# Patient Record
Sex: Female | Born: 1960 | Race: White | Hispanic: No | Marital: Married | Smoking: Former smoker
Health system: Southern US, Community
[De-identification: ages and names within clinical notes are randomized; demographics above are authoritative.]

## PROBLEM LIST (undated history)

## (undated) DIAGNOSIS — I2510 Atherosclerotic heart disease of native coronary artery without angina pectoris: Secondary | ICD-10-CM

## (undated) DIAGNOSIS — Z9889 Other specified postprocedural states: Secondary | ICD-10-CM

## (undated) DIAGNOSIS — Q685 Congenital bowing of long bones of leg, unspecified: Secondary | ICD-10-CM

## (undated) DIAGNOSIS — R112 Nausea with vomiting, unspecified: Secondary | ICD-10-CM

## (undated) DIAGNOSIS — K5732 Diverticulitis of large intestine without perforation or abscess without bleeding: Secondary | ICD-10-CM

## (undated) DIAGNOSIS — H8110 Benign paroxysmal vertigo, unspecified ear: Secondary | ICD-10-CM

## (undated) DIAGNOSIS — K219 Gastro-esophageal reflux disease without esophagitis: Secondary | ICD-10-CM

## (undated) DIAGNOSIS — Z8719 Personal history of other diseases of the digestive system: Secondary | ICD-10-CM

## (undated) DIAGNOSIS — F338 Other recurrent depressive disorders: Secondary | ICD-10-CM

## (undated) DIAGNOSIS — Z8619 Personal history of other infectious and parasitic diseases: Secondary | ICD-10-CM

## (undated) DIAGNOSIS — M1990 Unspecified osteoarthritis, unspecified site: Secondary | ICD-10-CM

## (undated) DIAGNOSIS — Z8759 Personal history of other complications of pregnancy, childbirth and the puerperium: Secondary | ICD-10-CM

## (undated) HISTORY — DX: Atherosclerotic heart disease of native coronary artery without angina pectoris: I25.10

## (undated) HISTORY — PX: OTHER SURGICAL HISTORY: SHX169

## (undated) HISTORY — DX: Other recurrent depressive disorders: F33.8

## (undated) HISTORY — DX: Gastro-esophageal reflux disease without esophagitis: K21.9

## (undated) HISTORY — PX: COLON SURGERY: SHX602

## (undated) HISTORY — PX: OSTEOTOMY: SHX137

## (undated) HISTORY — PX: TUBAL LIGATION: SHX77

## (undated) HISTORY — PX: TONSILLECTOMY: SUR1361

---

## 1982-05-01 HISTORY — PX: CHOLECYSTECTOMY OPEN: SUR202

## 1999-05-02 HISTORY — PX: LAPAROSCOPY FOR ECTOPIC PREGNANCY: SUR765

## 2000-01-29 DIAGNOSIS — O009 Unspecified ectopic pregnancy without intrauterine pregnancy: Secondary | ICD-10-CM | POA: Insufficient documentation

## 2000-01-29 DIAGNOSIS — Z9851 Tubal ligation status: Secondary | ICD-10-CM | POA: Insufficient documentation

## 2000-01-29 HISTORY — DX: Unspecified ectopic pregnancy without intrauterine pregnancy: O00.90

## 2002-10-14 ENCOUNTER — Other Ambulatory Visit: Admission: RE | Admit: 2002-10-14 | Discharge: 2002-10-14 | Payer: Self-pay | Admitting: Otolaryngology

## 2009-06-30 ENCOUNTER — Ambulatory Visit: Payer: Self-pay | Admitting: Family Medicine

## 2010-02-18 ENCOUNTER — Other Ambulatory Visit: Payer: Self-pay | Admitting: Unknown Physician Specialty

## 2010-12-21 ENCOUNTER — Encounter: Payer: Self-pay | Admitting: Internal Medicine

## 2010-12-21 ENCOUNTER — Ambulatory Visit (INDEPENDENT_AMBULATORY_CARE_PROVIDER_SITE_OTHER): Payer: PRIVATE HEALTH INSURANCE | Admitting: Internal Medicine

## 2010-12-21 VITALS — BP 100/58 | HR 68 | Temp 98.1°F | Resp 14 | Ht 65.0 in | Wt 165.2 lb

## 2010-12-21 DIAGNOSIS — Z720 Tobacco use: Secondary | ICD-10-CM

## 2010-12-21 DIAGNOSIS — F17200 Nicotine dependence, unspecified, uncomplicated: Secondary | ICD-10-CM

## 2010-12-21 DIAGNOSIS — Z87891 Personal history of nicotine dependence: Secondary | ICD-10-CM | POA: Insufficient documentation

## 2010-12-21 MED ORDER — NAPROXEN 500 MG PO TABS: 500.0000 mg | ORAL_TABLET | ORAL | Status: DC | PRN

## 2010-12-21 MED ORDER — OMEPRAZOLE 20 MG PO CPDR: 20.0000 mg | DELAYED_RELEASE_CAPSULE | Freq: Two times a day (BID) | ORAL | Status: DC

## 2010-12-21 MED ORDER — BUPROPION HCL 75 MG PO TABS: 75.0000 mg | ORAL_TABLET | Freq: Two times a day (BID) | ORAL | Status: DC

## 2010-12-21 NOTE — Progress Notes (Signed)
  Subjective:    Patient ID: Pamela Lara, female    DOB: 08/05/1960, 50 y.o.   MRN: [id number]  HPI Ms. Bottenfield is here to establish primary care .  Her previous physician was [facility]. She has no specific complaints but is planning to quit smoking and is requesting a prescriptin for wellbutrin.  Her husband gained 30 lbs on Chantix so she is not interested in trying it.     Review of Systems  Constitutional: Negative for fever, chills and unexpected weight change.  HENT: Negative for hearing loss, ear pain, nosebleeds, congestion, sore throat, facial swelling, rhinorrhea, sneezing, mouth sores, trouble swallowing, neck pain, neck stiffness, voice change, postnasal drip, sinus pressure, tinnitus and ear discharge.   Eyes: Negative for pain, discharge, redness and visual disturbance.  Respiratory: Negative for cough, chest tightness, shortness of breath, wheezing and stridor.   Cardiovascular: Negative for chest pain, palpitations and leg swelling.  Musculoskeletal: Negative for myalgias and arthralgias.  Skin: Negative for color change and rash.  Neurological: Negative for dizziness, weakness, light-headedness and headaches.  Hematological: Negative for adenopathy.  Psychiatric/Behavioral: Positive for dysphoric mood.       Objective:   Physical Exam        Assessment & Plan:

## 2011-02-13 ENCOUNTER — Telehealth: Payer: Self-pay | Admitting: *Deleted

## 2011-02-13 NOTE — Telephone Encounter (Signed)
Left message asking patient to return my call.

## 2011-02-13 NOTE — Telephone Encounter (Signed)
Message copied by Jobie Quaker on Mon Feb 13, 2011  2:02 PM ------      Message from: Duncan Dull      Created: Fri Feb 10, 2011  1:11 PM      Regarding: labs       Her outside labs were reviewed,  All i s fine except her triglycerides were 2 x normal.  I would like to address this with diet and exercise and repeat in 6 months.  272.5 fasting lipids.             Low carbohydrate /low glycemic index diet is the best dietary answer to elevated triglycerides.

## 2011-02-21 NOTE — Telephone Encounter (Signed)
Left message asking patient to return my call.

## 2011-02-27 NOTE — Telephone Encounter (Signed)
Left message asking patient to return my call.

## 2011-03-02 ENCOUNTER — Other Ambulatory Visit: Payer: Self-pay | Admitting: Internal Medicine

## 2011-03-02 DIAGNOSIS — E786 Lipoprotein deficiency: Secondary | ICD-10-CM

## 2011-03-02 NOTE — Telephone Encounter (Signed)
Spoke with patient. She was advised. Also mailed her an order for labs for 6 months because she has theses done at the hospital.

## 2011-03-14 ENCOUNTER — Ambulatory Visit: Payer: Self-pay | Admitting: Internal Medicine

## 2011-04-05 ENCOUNTER — Encounter: Payer: Self-pay | Admitting: Internal Medicine

## 2011-04-12 ENCOUNTER — Other Ambulatory Visit: Payer: Self-pay | Admitting: Internal Medicine

## 2011-04-13 MED ORDER — BUPROPION HCL 75 MG PO TABS: 75.0000 mg | ORAL_TABLET | Freq: Two times a day (BID) | ORAL | Status: DC

## 2011-04-13 NOTE — Telephone Encounter (Signed)
Patient is asking if she can get a 90 day supply on the wellbutrin.

## 2011-06-16 ENCOUNTER — Telehealth: Payer: Self-pay | Admitting: Internal Medicine

## 2011-06-16 NOTE — Telephone Encounter (Signed)
[facility] Triage Call Report Triage Record Num: [id number] Operator: Caswell Corwin Patient Name: Pamela Lara Call Date & Time: 06/16/2011 9:45:15AM Patient Phone: [phone] PCP: Patient Gender: Female PCP Fax : Patient DOB: 1960/11/12 Practice Name: [facility] Reason for Call: Caller: [name]; PCP: Duncan Dull; CB#: [phone]; Call regarding Menstrual Questions and has been having a period for last two weeks. This is day 15. A she is spotting and then may have some heavy flow for 1-2 hrs. Triaged Vag Bldg/Premenopausal/Abnormal and all emergent SX R/O. Home care and call back inst given. Inst needs to be seen in 2 weeks. Appt made for 03/22/12 at 1100 with [facility] per pt's request. Home care and call back inst given. Protocol(s) Used: Vaginal Bleeding: Premenopausal, Abnormal Recommended Outcome per Protocol: See Provider within 2 Weeks Reason for Outcome: Mild bleeding lasting more than 3 times the woman's usual period length Care Advice: ~ Use a "menstrual calendar" to track bleeding and menstrual cycle. ~ Call provider if symptoms worsen or new symptoms develop. Avoid the use of douches, other nonprescription vaginal preparations, or tub baths for 24 hours prior to provider evaluation. These activities may interfere with Pap smear, cultures, or other testing the provider may want to do. ~ 06/16/2011 10:04:28AM Page 1 of 1 [email or web address]

## 2011-06-21 NOTE — Telephone Encounter (Signed)
[facility] Triage Call Report Triage Record Num: [id number] Operator: Geanie Berlin Patient Name: Pamela Lara Call Date & Time: 06/21/2011 10:19:36AM Patient Phone: [phone] PCP: Duncan Dull Patient Gender: Female PCP Fax : [phone] Patient DOB: 1961/04/29 Practice Name: [facility] Reason for Call: Caller: Madilyn/Patient; PCP: Duncan Dull; CB#: [phone] (work) ; Call regarding Has Appointment 06/23/11 at 1100; Still Has Heavy Vaginal Bleeding; Asking if should Have Any Labs Prior To This Appointment.; LMP 06/02/11. BTL. Uses up to 2 pads/hr sometimes at night then flow tapers to approx 3-4 peripads per day. Declined triage. Info noted and sent to MD for reply for caller with non urgent question per PCP Call, No TriAge Guideline. OFFICE PLEASE CALL BACK; OK to leave message at work number above. Protocol(s) Used: PCP Calls, No Triage (Adult) Recommended Outcome per Protocol: Call Provider within 24 Hours Override Outcome if Used in Protocol: Information Noted and Sent to Office RN Reason for Override Outcome: Office To Call Pt. W/ F/u. Reason for Outcome: [1] Caller requests to speak ONLY to PCP AND [2] nonurgent question Care Advice: ~ 06/21/2011 10:28:12AM Page 1 of 1 [email or web address]

## 2011-06-21 NOTE — Telephone Encounter (Signed)
Left messages at home, cell and work numbers for pt to return the call.

## 2011-06-21 NOTE — Telephone Encounter (Signed)
She needs a cbc,  Ferriti, tibc,  PT/INR,  And TSH

## 2011-06-22 ENCOUNTER — Other Ambulatory Visit: Payer: Self-pay | Admitting: Internal Medicine

## 2011-06-22 ENCOUNTER — Telehealth: Payer: Self-pay | Admitting: Internal Medicine

## 2011-06-22 DIAGNOSIS — R58 Hemorrhage, not elsewhere classified: Secondary | ICD-10-CM

## 2011-06-22 LAB — CBC WITH DIFFERENTIAL/PLATELET
Basophil %: 0.2 %
Eosinophil #: 0.1 10*3/uL (ref 0.0–0.7)
Eosinophil %: 1.4 %
HGB: 13.4 g/dL (ref 12.0–16.0)
Lymphocyte %: 31.4 %
MCHC: 34.8 g/dL (ref 32.0–36.0)
Monocyte #: 0.6 10*3/uL (ref 0.0–0.7)
Neutrophil %: 59.3 %
Platelet: 221 10*3/uL (ref 150–440)
RBC: 4.25 10*6/uL (ref 3.80–5.20)
WBC: 7.7 10*3/uL (ref 3.6–11.0)

## 2011-06-22 LAB — PROTIME-INR
INR: 0.9
Prothrombin Time: 12.4 secs (ref 11.5–14.7)

## 2011-06-22 LAB — IRON AND TIBC
Iron Bind.Cap.(Total): 413 ug/dL (ref 250–450)
Iron Saturation: 13 %
Iron: 54 ug/dL (ref 50–170)

## 2011-06-22 LAB — FERRITIN: Ferritin (ARMC): 11 ng/mL (ref 8–388)

## 2011-06-22 LAB — TSH: Thyroid Stimulating Horm: 1.4 u[IU]/mL

## 2011-06-22 NOTE — Telephone Encounter (Signed)
Office Message [address] [place], [place] [address] p. [phone] f. [phone] To: [facility] (Daytime Triage) Fax: [phone] From: [facility] Date/ Time: 06/22/2011 4:05 PM Taken By: Gerri Spore, CSR Caller: [name] Facility: not collected Patient: Pamela Lara DOB: 16-May-1960 Phone: [phone] Reason for Call: Pt states she left a message early this morning but has not received a call back yet. 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 Marland Kitchen . . Called back and said that she has called 10x today and not got a call back on her work # [phone]. Please call when you can. Regarding Appointment: Appt Date: Appt Time: Unknown Provider: Reason: Details: Outcome: confidential

## 2011-06-22 NOTE — Telephone Encounter (Signed)
Office Message [address] [place], [place] [address] p. [phone] f. [phone] To: [facility] (Daytime Triage) Fax: [phone] From: [facility] Date/ Time: 06/22/2011 2:37 PM Taken By: Patria Mane, CSR Caller: [name] Facility: not collected Patient: Pamela, Lara DOB: 20-Jan-1961 Phone: [phone] Reason for Call: Pt states she left a message early this morning but has not received a call back yet. 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 Regarding Appointment: Appt Date: Appt Time: Unknown Provider: Reason: Details: Outcome:

## 2011-06-22 NOTE — Telephone Encounter (Signed)
Office Message [address] [place], [place] [address] p. [phone] f. [phone] To: [facility] (Daytime Triage) Fax: [phone] From: [facility] Date/ Time: 06/22/2011 9:23 AM Taken By: Thomasena Edis, CSR Caller: Revonda Standard Facility: not collected Patient: Pamela, Lara DOB: April 09, 1961 Phone: [phone] Reason for Call: She is returning Laura's call Regarding Appointment: Appt Date: Appt Time: Unknown Provider: Reason: Details: Outcome:

## 2011-06-22 NOTE — Telephone Encounter (Signed)
Opened in error

## 2011-06-23 ENCOUNTER — Encounter: Payer: Self-pay | Admitting: Internal Medicine

## 2011-06-23 ENCOUNTER — Ambulatory Visit (INDEPENDENT_AMBULATORY_CARE_PROVIDER_SITE_OTHER): Payer: PRIVATE HEALTH INSURANCE | Admitting: Internal Medicine

## 2011-06-23 VITALS — BP 112/68 | HR 68 | Temp 98.6°F | Wt 172.0 lb

## 2011-06-23 DIAGNOSIS — N924 Excessive bleeding in the premenopausal period: Secondary | ICD-10-CM

## 2011-06-23 DIAGNOSIS — F411 Generalized anxiety disorder: Secondary | ICD-10-CM

## 2011-06-23 DIAGNOSIS — F419 Anxiety disorder, unspecified: Secondary | ICD-10-CM

## 2011-06-23 MED ORDER — BUPROPION HCL 75 MG PO TABS: 75.0000 mg | ORAL_TABLET | Freq: Two times a day (BID) | ORAL | Status: DC

## 2011-06-23 NOTE — Progress Notes (Signed)
Subjective:    Patient ID: Pamela Lara, female    DOB: November 21, 1960, 51 y.o.   MRN: [id number]  HPI    51 yr old white female presents with persistent vaginal bleeding for 3 weeks.  Her bleeding is Heavy at times  with bright red clots and not associated with a lot of cramping.   She has no prior history of menstrual irregularities,  No symptoms of menopause,  And no history of bleeding diathesis.  Husband has had a vasectomy so she no chance of IUP.  Past Medical History  Diagnosis Date  . GERD (gastroesophageal reflux disease)   . Seasonal affective disorder    Current Outpatient Prescriptions on File Prior to Visit  Medication Sig Dispense Refill  . Multiple Vitamin (MULTIVITAMIN) tablet Take 1 tablet by mouth daily.        . naproxen (NAPROSYN) 500 MG tablet Take 1 tablet (500 mg total) by mouth as needed.  90 tablet  3  . omeprazole (PRILOSEC) 20 MG capsule Take 1 capsule (20 mg total) by mouth 2 (two) times daily.  180 capsule  3     Review of Systems  Constitutional: Negative for fever, chills and unexpected weight change.  HENT: Negative for hearing loss, ear pain, nosebleeds, congestion, sore throat, facial swelling, rhinorrhea, sneezing, mouth sores, trouble swallowing, neck pain, neck stiffness, voice change, postnasal drip, sinus pressure, tinnitus and ear discharge.   Eyes: Negative for pain, discharge, redness and visual disturbance.  Respiratory: Negative for cough, chest tightness, shortness of breath, wheezing and stridor.   Cardiovascular: Negative for chest pain, palpitations and leg swelling.  Musculoskeletal: Negative for myalgias and arthralgias.  Skin: Negative for color change and rash.  Neurological: Negative for dizziness, weakness, light-headedness and headaches.  Hematological: Negative for adenopathy.       Objective:   Physical Exam  Constitutional: She is oriented to person, place, and time. She appears well-developed and well-nourished.  HENT:    Mouth/Throat: Oropharynx is clear and moist.  Eyes: EOM are normal. Pupils are equal, round, and reactive to light. No scleral icterus.  Neck: Normal range of motion. Neck supple. No JVD present. No thyromegaly present.  Cardiovascular: Normal rate, regular rhythm, normal heart sounds and intact distal pulses.   Pulmonary/Chest: Effort normal and breath sounds normal.  Abdominal: Soft. Bowel sounds are normal. She exhibits no mass. There is no tenderness.  Musculoskeletal: Normal range of motion. She exhibits no edema.  Lymphadenopathy:    She has no cervical adenopathy.  Neurological: She is alert and oriented to person, place, and time.  Skin: Skin is warm and dry.  Psychiatric: She has a normal mood and affect.      Assessment & Plan:   Menorrhagia, premenopausal She has no signs of clotting disorder, anemia, thyroid disorder, or liver/kidney dysfunction.  Likely secondary to premenopausal hormonal instability .  Offered rx for oral contraceptives to regulate periods, vs referral to Gynecology for endometrial ultrasoudn/evaluation.  Will reefer to GYN,  Dr. Greggory Keen.     Updated Medication List Outpatient Encounter Prescriptions as of 06/23/2011  Medication Sig Dispense Refill  . buPROPion (WELLBUTRIN) 75 MG tablet Take 1 tablet (75 mg total) by mouth 2 (two) times daily.  180 tablet  3  . calcium carbonate (OS-CAL) 600 MG TABS Take 600 mg by mouth daily.      . Cholecalciferol (VITAMIN D3) 1000 UNITS CAPS Take one by mouth daily      . Multiple Vitamin (MULTIVITAMIN) tablet Take 1  tablet by mouth daily.        . naproxen (NAPROSYN) 500 MG tablet Take 1 tablet (500 mg total) by mouth as needed.  90 tablet  3  . omeprazole (PRILOSEC) 20 MG capsule Take 1 capsule (20 mg total) by mouth 2 (two) times daily.  180 capsule  3  . DISCONTD: buPROPion (WELLBUTRIN) 75 MG tablet Take 1 tablet (75 mg total) by mouth 2 (two) times daily.  180 tablet  0

## 2011-06-23 NOTE — Telephone Encounter (Signed)
Spoke with patient yesterday afternoon and faxed over orders to [facility]. Patient had labs done yesterday evening and we have already received results.

## 2011-06-23 NOTE — Patient Instructions (Signed)
I am sending you to Dr Beatris Si for evaluation of your perimenopausal bleeding

## 2011-06-25 ENCOUNTER — Encounter: Payer: Self-pay | Admitting: Internal Medicine

## 2011-06-25 DIAGNOSIS — N924 Excessive bleeding in the premenopausal period: Secondary | ICD-10-CM | POA: Insufficient documentation

## 2011-06-25 NOTE — Assessment & Plan Note (Signed)
She has no signs of clotting disorder, anemia, thyroid disorder, or liver/kidney dysfunction.  Likely secondary to premenopausal hormonal instability .  Offered rx for oral contraceptives to regulate periods, vs referral to Gynecology for endometrial ultrasoudn/evaluation.  Will reefer to GYN,  Dr. Greggory Keen.

## 2011-06-30 ENCOUNTER — Telehealth: Payer: Self-pay | Admitting: Internal Medicine

## 2011-07-10 ENCOUNTER — Encounter: Payer: Self-pay | Admitting: Internal Medicine

## 2011-08-15 NOTE — Telephone Encounter (Signed)
OPened in error.

## 2011-12-19 ENCOUNTER — Ambulatory Visit: Payer: Self-pay | Admitting: Internal Medicine

## 2011-12-19 LAB — URINALYSIS, COMPLETE

## 2011-12-21 LAB — URINE CULTURE

## 2012-02-22 ENCOUNTER — Telehealth: Payer: Self-pay | Admitting: Internal Medicine

## 2012-02-22 DIAGNOSIS — Z1239 Encounter for other screening for malignant neoplasm of breast: Secondary | ICD-10-CM

## 2012-02-22 DIAGNOSIS — Z1211 Encounter for screening for malignant neoplasm of colon: Secondary | ICD-10-CM

## 2012-02-22 NOTE — Telephone Encounter (Signed)
Patient needing a referral for colonoscopy screening with Dr. Niel Hummer and a screening mammogram at [facility].

## 2012-02-22 NOTE — Telephone Encounter (Signed)
Orders in EPIC.   If you do not receive a call about your appt int the next 48 hours, please call our office and ask for Erie Noe, our scheduler.

## 2012-03-14 ENCOUNTER — Ambulatory Visit: Payer: Self-pay | Admitting: Internal Medicine

## 2012-03-14 LAB — HM MAMMOGRAPHY: HM Mammogram: NORMAL

## 2012-03-27 ENCOUNTER — Ambulatory Visit (INDEPENDENT_AMBULATORY_CARE_PROVIDER_SITE_OTHER): Payer: PRIVATE HEALTH INSURANCE | Admitting: Internal Medicine

## 2012-03-27 ENCOUNTER — Encounter: Payer: Self-pay | Admitting: Internal Medicine

## 2012-03-27 VITALS — BP 120/70 | HR 78 | Temp 98.5°F | Ht 64.0 in | Wt 169.0 lb

## 2012-03-27 DIAGNOSIS — Z720 Tobacco use: Secondary | ICD-10-CM

## 2012-03-27 DIAGNOSIS — F411 Generalized anxiety disorder: Secondary | ICD-10-CM

## 2012-03-27 DIAGNOSIS — Z0000 Encounter for general adult medical examination without abnormal findings: Secondary | ICD-10-CM

## 2012-03-27 DIAGNOSIS — F17200 Nicotine dependence, unspecified, uncomplicated: Secondary | ICD-10-CM

## 2012-03-27 DIAGNOSIS — F419 Anxiety disorder, unspecified: Secondary | ICD-10-CM

## 2012-03-27 MED ORDER — BUPROPION HCL 75 MG PO TABS: 75.0000 mg | ORAL_TABLET | Freq: Two times a day (BID) | ORAL | Status: DC

## 2012-03-27 MED ORDER — OMEPRAZOLE 20 MG PO CPDR: 20.0000 mg | DELAYED_RELEASE_CAPSULE | Freq: Two times a day (BID) | ORAL | Status: DC

## 2012-03-27 NOTE — Progress Notes (Signed)
Patient ID: Pamela Lara, female   DOB: 06/23/1960, 51 y.o.   MRN: [id number]   Subjective:     Pamela Lara is a 51 y.o. female and is here for a comprehensive physical exam. The patient reports no problems.  History   Social History  . Marital Status: Married    Spouse Name: N/A    Number of Children: N/A  . Years of Education: N/A   Occupational History  . Not on file.   Social History Main Topics  . Smoking status: Current Every Day Smoker    Types: Cigarettes  . Smokeless tobacco: Never Used  . Alcohol Use: Yes     Comment: occasional  . Drug Use: No  . Sexually Active: Not on file   Other Topics Concern  . Not on file   Social History Narrative  . No narrative on file   Health Maintenance  Topic Date Due  . Pap Smear  02/24/1979  . Colonoscopy  02/24/2011  . Influenza Vaccine  12/30/2012  . Mammogram  02/24/2014  . Tetanus/tdap  03/27/2020    The following portions of the patient's history were reviewed and updated as appropriate: allergies, current medications, past family history, past medical history, past social history, past surgical history and problem list.  Review of Systems A comprehensive review of systems was negative.   Objective:   General appearance: alert, cooperative and appears stated age Ears: normal TM's and external ear canals both ears Throat: lips, mucosa, and tongue normal; teeth and gums normal Neck: no adenopathy, no carotid bruit, supple, symmetrical, trachea midline and thyroid not enlarged, symmetric, no tenderness/mass/nodules Back: symmetric, no curvature. ROM normal. No CVA tenderness. Lungs: clear to auscultation bilaterally Breasts: breasts appear normal, no suspicious masses, no skin or nipple changes or axillary nodes. Heart: regular rate and rhythm, S1, S2 normal, no murmur, click, rub or gallop Abdomen: soft, non-tender; bowel sounds normal; no masses,  no organomegaly Pulses: 2+ and symmetric Skin: Skin color,  texture, turgor normal. No rashes or lesions Lymph nodes: Cervical, supraclavicular, and axillary nodes normal.      Assessment:   Routine general medical examination at a health care facility And annual exam was completed except for pelvic exam due to current menstruation.  She were to return in a few months for a pelvic exam only. She was given prescription for screening labs to be done at [facility] including screening for thyroid disorder diabetes and hyperlipidemia.  Tobacco abuse She was able to quit for 2 weeks while recently vacationing in [place]. Encouragement given.   Updated Medication List Outpatient Encounter Prescriptions as of 03/27/2012  Medication Sig Dispense Refill  . buPROPion (WELLBUTRIN) 75 MG tablet Take 1 tablet (75 mg total) by mouth 2 (two) times daily.  180 tablet  6  . calcium carbonate (OS-CAL) 600 MG TABS Take 600 mg by mouth daily.      . Cholecalciferol (VITAMIN D3) 1000 UNITS CAPS Take one by mouth daily      . Misc Natural Products (GLUCOSAMINE CHOND COMPLEX/MSM PO) Take by mouth daily. take 2 pills once a day      . Multiple Vitamin (MULTIVITAMIN) tablet Take 1 tablet by mouth daily.        . naproxen (NAPROSYN) 500 MG tablet Take 1 tablet (500 mg total) by mouth as needed.  90 tablet  3  . omeprazole (PRILOSEC) 20 MG capsule Take 1 capsule (20 mg total) by mouth 2 (two) times daily.  180 capsule  3  . [DISCONTINUED] buPROPion (WELLBUTRIN) 75 MG tablet Take 1 tablet (75 mg total) by mouth 2 (two) times daily.  180 tablet  3  . [DISCONTINUED] omeprazole (PRILOSEC) 20 MG capsule Take 1 capsule (20 mg total) by mouth 2 (two) times daily.  180 capsule  3

## 2012-03-29 ENCOUNTER — Encounter: Payer: Self-pay | Admitting: Internal Medicine

## 2012-03-29 DIAGNOSIS — Z0000 Encounter for general adult medical examination without abnormal findings: Secondary | ICD-10-CM | POA: Insufficient documentation

## 2012-03-29 NOTE — Assessment & Plan Note (Signed)
And annual exam was completed except for pelvic exam due to current menstruation.  She were to return in a few months for a pelvic exam only. She was given prescription for screening labs to be done at [facility] including screening for thyroid disorder diabetes and hyperlipidemia.

## 2012-03-29 NOTE — Assessment & Plan Note (Signed)
She was able to quit for 2 weeks while recently vacationing in [place]. Encouragement given.

## 2012-04-10 ENCOUNTER — Ambulatory Visit: Payer: Self-pay | Admitting: Gastroenterology

## 2012-04-10 ENCOUNTER — Other Ambulatory Visit: Payer: Self-pay | Admitting: Internal Medicine

## 2012-04-10 LAB — CBC WITH DIFFERENTIAL/PLATELET
Basophil #: 0 10*3/uL (ref 0.0–0.1)
Basophil %: 0.3 %
Eosinophil #: 0.1 10*3/uL (ref 0.0–0.7)
Eosinophil %: 1.2 %
HCT: 41.6 % (ref 35.0–47.0)
HGB: 15 g/dL (ref 12.0–16.0)
Lymphocyte #: 2.5 10*3/uL (ref 1.0–3.6)
Lymphocyte %: 26.2 %
MCH: 32 pg (ref 26.0–34.0)
MCHC: 35.9 g/dL (ref 32.0–36.0)
MCV: 89 fL (ref 80–100)
Monocyte #: 0.6 x10 3/mm (ref 0.2–0.9)
Monocyte %: 6.2 %
Neutrophil #: 6.3 10*3/uL (ref 1.4–6.5)
Neutrophil %: 66.1 %
Platelet: 225 10*3/uL (ref 150–440)
RBC: 4.68 10*6/uL (ref 3.80–5.20)
RDW: 12.6 % (ref 11.5–14.5)
WBC: 9.5 10*3/uL (ref 3.6–11.0)

## 2012-04-10 LAB — COMPREHENSIVE METABOLIC PANEL
Albumin: 3.8 g/dL (ref 3.4–5.0)
Anion Gap: 11 (ref 7–16)
Calcium, Total: 8.3 mg/dL — ABNORMAL LOW (ref 8.5–10.1)
Chloride: 103 mmol/L (ref 98–107)
Co2: 23 mmol/L (ref 21–32)
Creatinine: 0.85 mg/dL (ref 0.60–1.30)
EGFR (African American): >60
EGFR (Non-African Amer.): >60
Osmolality: 270 (ref 275–301)
Potassium: 3.4 mmol/L — ABNORMAL LOW (ref 3.5–5.1)
SGOT(AST): 21 U/L (ref 15–37)
Sodium: 137 mmol/L (ref 136–145)

## 2012-04-10 LAB — LIPID PANEL
Cholesterol: 153 mg/dL (ref 0–200)
HDL Cholesterol: 43 mg/dL (ref 40–60)
Ldl Cholesterol, Calc: 51 mg/dL (ref 0–100)
Triglycerides: 295 mg/dL — ABNORMAL HIGH (ref 0–200)
VLDL Cholesterol, Calc: 59 mg/dL — ABNORMAL HIGH (ref 5–40)

## 2012-04-10 LAB — TSH: Thyroid Stimulating Horm: 1.74 u[IU]/mL

## 2012-04-10 LAB — HM COLONOSCOPY: HM Colonoscopy: NORMAL

## 2012-04-12 LAB — PATHOLOGY REPORT

## 2012-04-15 ENCOUNTER — Telehealth: Payer: Self-pay | Admitting: Internal Medicine

## 2012-04-15 NOTE — Telephone Encounter (Signed)
Potassium level was slightly low,  Increase her potassium containing foods (sweet potatoes,  ornage juice, etc. ) does not need a suplement Triglycerides are nearly 300. If she is not exercising and following a ow glycemic index diet, I would rec that first and repeat labs  in 3 months

## 2012-04-15 NOTE — Telephone Encounter (Signed)
Left detailed message on patient vm with lab results and instructions.

## 2012-04-24 ENCOUNTER — Encounter: Payer: Self-pay | Admitting: Internal Medicine

## 2012-04-24 DIAGNOSIS — K644 Residual hemorrhoidal skin tags: Secondary | ICD-10-CM | POA: Insufficient documentation

## 2012-04-24 DIAGNOSIS — K219 Gastro-esophageal reflux disease without esophagitis: Secondary | ICD-10-CM | POA: Insufficient documentation

## 2012-06-22 ENCOUNTER — Ambulatory Visit: Payer: Self-pay | Admitting: Physician Assistant

## 2012-06-22 LAB — RAPID INFLUENZA A&B ANTIGENS

## 2012-07-29 ENCOUNTER — Encounter: Payer: PRIVATE HEALTH INSURANCE | Admitting: Internal Medicine

## 2013-03-03 ENCOUNTER — Telehealth: Payer: Self-pay | Admitting: Internal Medicine

## 2013-03-03 NOTE — Telephone Encounter (Signed)
Pt has cpx on 12/3.  Pt stated she can get her labs done [facility]  Can an order be sent to them.  Please advise pt

## 2013-03-03 NOTE — Telephone Encounter (Signed)
Yes I will give you the order

## 2013-03-03 NOTE — Telephone Encounter (Signed)
Please advise can patient have labs at [facility].

## 2013-03-04 NOTE — Telephone Encounter (Signed)
Left message lab order ready for pick up placed up front,

## 2013-03-18 ENCOUNTER — Ambulatory Visit: Payer: Self-pay | Admitting: Internal Medicine

## 2013-04-02 ENCOUNTER — Encounter: Payer: Self-pay | Admitting: Internal Medicine

## 2013-04-02 ENCOUNTER — Other Ambulatory Visit (HOSPITAL_COMMUNITY)
Admission: RE | Admit: 2013-04-02 | Discharge: 2013-04-02 | Disposition: A | Discharge status: Home / Self Care | Payer: 59 | Source: Ambulatory Visit | Attending: Internal Medicine | Admitting: Internal Medicine

## 2013-04-02 ENCOUNTER — Ambulatory Visit (INDEPENDENT_AMBULATORY_CARE_PROVIDER_SITE_OTHER): Payer: 59 | Admitting: Internal Medicine

## 2013-04-02 ENCOUNTER — Telehealth: Payer: Self-pay | Admitting: *Deleted

## 2013-04-02 ENCOUNTER — Other Ambulatory Visit (INDEPENDENT_AMBULATORY_CARE_PROVIDER_SITE_OTHER): Payer: 59

## 2013-04-02 VITALS — BP 114/74 | HR 72 | Temp 98.2°F | Resp 12 | Ht 65.0 in | Wt 165.0 lb

## 2013-04-02 DIAGNOSIS — R58 Hemorrhage, not elsewhere classified: Secondary | ICD-10-CM

## 2013-04-02 DIAGNOSIS — N924 Excessive bleeding in the premenopausal period: Secondary | ICD-10-CM

## 2013-04-02 DIAGNOSIS — R5381 Other malaise: Secondary | ICD-10-CM

## 2013-04-02 DIAGNOSIS — Z01419 Encounter for gynecological examination (general) (routine) without abnormal findings: Secondary | ICD-10-CM | POA: Insufficient documentation

## 2013-04-02 DIAGNOSIS — E785 Hyperlipidemia, unspecified: Secondary | ICD-10-CM

## 2013-04-02 DIAGNOSIS — Z1151 Encounter for screening for human papillomavirus (HPV): Secondary | ICD-10-CM | POA: Insufficient documentation

## 2013-04-02 DIAGNOSIS — F17200 Nicotine dependence, unspecified, uncomplicated: Secondary | ICD-10-CM

## 2013-04-02 DIAGNOSIS — Z0000 Encounter for general adult medical examination without abnormal findings: Secondary | ICD-10-CM

## 2013-04-02 DIAGNOSIS — Z124 Encounter for screening for malignant neoplasm of cervix: Secondary | ICD-10-CM

## 2013-04-02 DIAGNOSIS — Z720 Tobacco use: Secondary | ICD-10-CM

## 2013-04-02 LAB — CBC WITH DIFFERENTIAL/PLATELET
Basophils Absolute: 0 10*3/uL (ref 0.0–0.1)
Eosinophils Relative: 1 % (ref 0–5)
Hemoglobin: 14.5 g/dL (ref 12.0–15.0)
Lymphocytes Relative: 26 % (ref 12–46)
MCV: 88.6 fL (ref 78.0–100.0)
Monocytes Absolute: 0.5 10*3/uL (ref 0.1–1.0)
Monocytes Relative: 5 % (ref 3–12)
Platelets: 210 10*3/uL (ref 150–400)
RBC: 4.64 MIL/uL (ref 3.87–5.11)
RDW: 13.3 % (ref 11.5–15.5)
WBC: 8.3 10*3/uL (ref 4.0–10.5)

## 2013-04-02 LAB — COMPREHENSIVE METABOLIC PANEL
ALT: 17 U/L (ref 0–35)
Albumin: 4.2 g/dL (ref 3.5–5.2)
Alkaline Phosphatase: 46 U/L (ref 39–117)
Total Bilirubin: 0.7 mg/dL (ref 0.3–1.2)
Total Protein: 6.9 g/dL (ref 6.0–8.3)

## 2013-04-02 LAB — IRON AND TIBC
Iron: 120 ug/dL (ref 42–145)
TIBC: 371 ug/dL (ref 250–470)

## 2013-04-02 LAB — LIPID PANEL
Cholesterol: 192 mg/dL (ref 0–200)
HDL: 52 mg/dL (ref >39)
LDL Cholesterol: 107 mg/dL — ABNORMAL HIGH (ref 0–99)
Triglycerides: 167 mg/dL — ABNORMAL HIGH (ref <150)

## 2013-04-02 LAB — TSH: TSH: 1.135 u[IU]/mL (ref 0.350–4.500)

## 2013-04-02 LAB — FERRITIN: Ferritin: 36 ng/mL (ref 10–291)

## 2013-04-02 LAB — PROTIME-INR: INR: 1.01 (ref <1.50)

## 2013-04-02 NOTE — Progress Notes (Signed)
Patient ID: Pamela Lara, female   DOB: March 30, 1961, 52 y.o.   MRN: [id number]  Subjective:     Pamela Lara is a 52 y.o. female and is here for a comprehensive physical exam. The patient reports no problems.  History   Social History  . Marital Status: Married    Spouse Name: N/A    Number of Children: N/A  . Years of Education: N/A   Occupational History  . Not on file.   Social History Main Topics  . Smoking status: Current Every Day Smoker    Types: Cigarettes  . Smokeless tobacco: Never Used     Comment:  just 2 cigarettes per day.  . Alcohol Use: Yes     Comment: occasional  . Drug Use: No  . Sexual Activity: Not on file   Other Topics Concern  . Not on file   Social History Narrative  . No narrative on file   Health Maintenance  Topic Date Due  . Influenza Vaccine  11/29/2013  . Mammogram  03/14/2014  . Pap Smear  04/02/2016  . Tetanus/tdap  03/27/2020  . Colonoscopy  04/10/2022    The following portions of the patient's history were reviewed and updated as appropriate: allergies, current medications, past family history, past medical history, past social history, past surgical history and problem list.  Review of Systems Patient denies headache, fevers, malaise, unintentional weight loss, skin rash, eye pain, sinus congestion and sinus pain, sore throat, dysphagia,  hemoptysis , cough, dyspnea, wheezing, chest pain, palpitations, orthopnea, edema, abdominal pain, nausea, melena, diarrhea, constipation, flank pain, dysuria, hematuria, urinary  Frequency, nocturia, numbness, tingling, seizures,  Focal weakness, Loss of consciousness,  Tremor, insomnia, depression, anxiety, and suicidal ideation.     Objective:   BP 114/74  Pulse 72  Temp(Src) 98.2 F (36.8 C) (Oral)  Resp 12  Ht 5\' 5"  (1.651 m)  Wt 165 lb (74.844 kg)  BMI 27.46 kg/m2  SpO2 97%  LMP 03/14/2013  General Appearance:    Alert, cooperative, no distress, appears stated age  Head:     Normocephalic, without obvious abnormality, atraumatic  Eyes:    PERRL, conjunctiva/corneas clear, EOM's intact, fundi    benign, both eyes  Ears:    Normal TM's and external ear canals, both ears  Nose:   Nares normal, septum midline, mucosa normal, no drainage    or sinus tenderness  Throat:   Lips, mucosa, and tongue normal; teeth and gums normal  Neck:   Supple, symmetrical, trachea midline, no adenopathy;    thyroid:  no enlargement/tenderness/nodules; no carotid   bruit or JVD  Back:     Symmetric, no curvature, ROM normal, no CVA tenderness  Lungs:     Clear to auscultation bilaterally, respirations unlabored  Chest Wall:    No tenderness or deformity   Heart:    Regular rate and rhythm, S1 and S2 normal, no murmur, rub   or gallop  Breast Exam:    No tenderness, masses, or nipple abnormality  Abdomen:     Soft, non-tender, bowel sounds active all four quadrants,    no masses, no organomegaly  Genitalia:    Normal female without lesion, discharge or tenderness  Rectal:   deferred  Extremities:   Extremities normal, atraumatic, no cyanosis or edema  Pulses:   2+ and symmetric all extremities  Skin:   Skin color, texture, turgor normal, no rashes or lesions  Lymph nodes:   Cervical, supraclavicular, and axillary nodes normal  Neurologic:   CNII-XII intact, normal strength, sensation and reflexes    throughout    Assessment and Plan:  Cervical cancer screening PAP smear was normal. Dec 2014   Menorrhagia, premenopausal She has no signs of iron deficiency.  Coagulopathy or thyroid dysfunction  Routine general medical examination at a health care facility Annual comprehensive exam was done including breast, pelvic and PAP smear. All screenings have been addressed .   Tobacco abuse Smoking cessation instruction/counseling given:  counseled patient on the dangers of tobacco use, advised patient to stop smoking, and reviewed strategies to maximize success   Updated Medication  List Outpatient Encounter Prescriptions as of 04/02/2013  Medication Sig  . Cholecalciferol (VITAMIN D3) 1000 UNITS CAPS Take one by mouth daily  . Multiple Vitamin (MULTIVITAMIN) tablet Take 1 tablet by mouth daily.    . Omega-3 Fatty Acids (FISH OIL) 1000 MG CAPS Take 2 capsules by mouth daily.  . [DISCONTINUED] buPROPion (WELLBUTRIN) 75 MG tablet Take 1 tablet (75 mg total) by mouth 2 (two) times daily.  . [DISCONTINUED] buPROPion (WELLBUTRIN) 75 MG tablet Take 150 mg by mouth daily.  . [DISCONTINUED] calcium carbonate (OS-CAL) 600 MG TABS Take 600 mg by mouth daily.  . [DISCONTINUED] Misc Natural Products (GLUCOSAMINE CHOND COMPLEX/MSM PO) Take by mouth daily. take 2 pills once a day  . [DISCONTINUED] naproxen (NAPROSYN) 500 MG tablet Take 1 tablet (500 mg total) by mouth as needed.  . [DISCONTINUED] omeprazole (PRILOSEC) 20 MG capsule Take 1 capsule (20 mg total) by mouth 2 (two) times daily.

## 2013-04-02 NOTE — Patient Instructions (Signed)
You had your annual  wellness exam today  We will  E mail you or phone you  with the bloodwork results  Remember that exercise and low glycemin index diets help lower triglycerides if they are only minimally elevated (as yours were last year)

## 2013-04-02 NOTE — Telephone Encounter (Signed)
What labs and dx? 02.21.2013 labs expired do you want the same ones?

## 2013-04-03 ENCOUNTER — Encounter: Payer: Self-pay | Admitting: Internal Medicine

## 2013-04-05 DIAGNOSIS — Z124 Encounter for screening for malignant neoplasm of cervix: Secondary | ICD-10-CM | POA: Insufficient documentation

## 2013-04-05 NOTE — Assessment & Plan Note (Signed)
PAP smear was normal. Dec 2014

## 2013-04-05 NOTE — Assessment & Plan Note (Signed)
Smoking cessation instruction/counseling given:  counseled patient on the dangers of tobacco use, advised patient to stop smoking, and reviewed strategies to maximize success 

## 2013-04-05 NOTE — Assessment & Plan Note (Signed)
She has no signs of iron deficiency.  Coagulopathy or thyroid dysfunction

## 2013-04-05 NOTE — Assessment & Plan Note (Signed)
Annual comprehensive exam was done including breast, pelvic and PAP smear. All screenings have been addressed .  

## 2013-04-06 ENCOUNTER — Encounter: Payer: Self-pay | Admitting: Internal Medicine

## 2013-04-07 ENCOUNTER — Encounter: Payer: Self-pay | Admitting: Internal Medicine

## 2013-05-19 ENCOUNTER — Other Ambulatory Visit: Payer: Self-pay | Admitting: Internal Medicine

## 2013-05-19 ENCOUNTER — Telehealth: Payer: Self-pay | Admitting: Internal Medicine

## 2013-05-19 NOTE — Telephone Encounter (Signed)
Pt states she has been out of her wellbutrin x2 days.  States her pharmacy sent the request last Wednesday and has not heard back.  [facility].

## 2013-05-19 NOTE — Telephone Encounter (Signed)
Last OV 04/02/13 may I refill wellbutrin?

## 2013-10-03 ENCOUNTER — Other Ambulatory Visit: Payer: Self-pay | Admitting: Internal Medicine

## 2013-12-29 ENCOUNTER — Other Ambulatory Visit: Payer: Self-pay | Admitting: Internal Medicine

## 2014-04-02 ENCOUNTER — Encounter: Payer: 59 | Admitting: Internal Medicine

## 2014-04-22 ENCOUNTER — Encounter: Payer: Self-pay | Admitting: Internal Medicine

## 2014-04-22 ENCOUNTER — Ambulatory Visit (INDEPENDENT_AMBULATORY_CARE_PROVIDER_SITE_OTHER): Payer: 59 | Admitting: Internal Medicine

## 2014-04-22 VITALS — BP 118/70 | HR 75 | Temp 98.2°F | Resp 14 | Ht 65.25 in | Wt 173.0 lb

## 2014-04-22 DIAGNOSIS — R5383 Other fatigue: Secondary | ICD-10-CM

## 2014-04-22 DIAGNOSIS — E010 Iodine-deficiency related diffuse (endemic) goiter: Secondary | ICD-10-CM

## 2014-04-22 DIAGNOSIS — K219 Gastro-esophageal reflux disease without esophagitis: Secondary | ICD-10-CM

## 2014-04-22 DIAGNOSIS — Z0000 Encounter for general adult medical examination without abnormal findings: Secondary | ICD-10-CM

## 2014-04-22 DIAGNOSIS — E049 Nontoxic goiter, unspecified: Secondary | ICD-10-CM

## 2014-04-22 DIAGNOSIS — Z1239 Encounter for other screening for malignant neoplasm of breast: Secondary | ICD-10-CM

## 2014-04-22 DIAGNOSIS — Z720 Tobacco use: Secondary | ICD-10-CM

## 2014-04-22 DIAGNOSIS — E559 Vitamin D deficiency, unspecified: Secondary | ICD-10-CM

## 2014-04-22 DIAGNOSIS — E785 Hyperlipidemia, unspecified: Secondary | ICD-10-CM

## 2014-04-22 LAB — CBC WITH DIFFERENTIAL/PLATELET
BASOS PCT: 0.3 % (ref 0.0–3.0)
Basophils Absolute: 0 10*3/uL (ref 0.0–0.1)
EOS ABS: 0.1 10*3/uL (ref 0.0–0.7)
EOS PCT: 1 % (ref 0.0–5.0)
HEMATOCRIT: 41.1 % (ref 36.0–46.0)
HEMOGLOBIN: 14 g/dL (ref 12.0–15.0)
LYMPHS ABS: 2.4 10*3/uL (ref 0.7–4.0)
Lymphocytes Relative: 31.2 % (ref 12.0–46.0)
MCHC: 34.1 g/dL (ref 30.0–36.0)
MCV: 91.4 fl (ref 78.0–100.0)
MONO ABS: 0.4 10*3/uL (ref 0.1–1.0)
Monocytes Relative: 5.4 % (ref 3.0–12.0)
NEUTROS ABS: 4.8 10*3/uL (ref 1.4–7.7)
Neutrophils Relative %: 62.1 % (ref 43.0–77.0)
Platelets: 215 10*3/uL (ref 150.0–400.0)
RBC: 4.49 Mil/uL (ref 3.87–5.11)
RDW: 12.6 % (ref 11.5–15.5)
WBC: 7.8 10*3/uL (ref 4.0–10.5)

## 2014-04-22 LAB — LIPID PANEL
CHOL/HDL RATIO: 5
Cholesterol: 220 mg/dL — ABNORMAL HIGH (ref 0–200)
HDL: 47.5 mg/dL (ref >39.00)
NONHDL: 172.5
Triglycerides: 208 mg/dL — ABNORMAL HIGH (ref 0.0–149.0)
VLDL: 41.6 mg/dL — ABNORMAL HIGH (ref 0.0–40.0)

## 2014-04-22 LAB — COMPREHENSIVE METABOLIC PANEL
ALBUMIN: 4.1 g/dL (ref 3.5–5.2)
ALT: 30 U/L (ref 0–35)
AST: 23 U/L (ref 0–37)
Alkaline Phosphatase: 49 U/L (ref 39–117)
BILIRUBIN TOTAL: 0.8 mg/dL (ref 0.2–1.2)
BUN: 10 mg/dL (ref 6–23)
CO2: 22 meq/L (ref 19–32)
Calcium: 8.9 mg/dL (ref 8.4–10.5)
Chloride: 105 mEq/L (ref 96–112)
Creatinine, Ser: 0.7 mg/dL (ref 0.4–1.2)
GFR: 92.98 mL/min (ref >60.00)
GLUCOSE: 82 mg/dL (ref 70–99)
POTASSIUM: 4.1 meq/L (ref 3.5–5.1)
SODIUM: 135 meq/L (ref 135–145)
TOTAL PROTEIN: 6.7 g/dL (ref 6.0–8.3)

## 2014-04-22 LAB — VITAMIN D 25 HYDROXY (VIT D DEFICIENCY, FRACTURES): VITD: 31.44 ng/mL (ref 30.00–100.00)

## 2014-04-22 LAB — TSH: TSH: 0.98 u[IU]/mL (ref 0.35–4.50)

## 2014-04-22 LAB — LDL CHOLESTEROL, DIRECT: Direct LDL: 136.6 mg/dL

## 2014-04-22 NOTE — Progress Notes (Signed)
Patient ID: Pamela Lara, female   DOB: [birth year], 53 y.o.   MRN: [id number]  Subjective:     Pamela Lara is a 53 y.o. female and is here for a comprehensive physical exam. The patient reports no problems. Joint pain:  She has recently stopped taking Wellbutrin and notes that her joint pain has resolved.  She is currently using light therapy for management of SAD and symptoms are well controlled currently   History   Social History  . Marital Status: Married    Spouse Name: N/A    Number of Children: N/A  . Years of Education: N/A   Occupational History  . Not on file.   Social History Main Topics  . Smoking status: Current Every Day Smoker    Types: Cigarettes  . Smokeless tobacco: Never Used     Comment:  just 2 cigarettes per day.  . Alcohol Use: Yes     Comment: occasional  . Drug Use: No  . Sexual Activity: Not on file   Other Topics Concern  . Not on file   Social History Narrative   Health Maintenance  Topic Date Due  . INFLUENZA VACCINE  11/30/2014  . MAMMOGRAM  03/19/2015  . PAP SMEAR  04/02/2016  . TETANUS/TDAP  03/27/2020  . COLONOSCOPY  04/10/2022    The following portions of the patient's history were reviewed and updated as appropriate: allergies, current medications, past family history, past medical history, past social history, past surgical history and problem list.  Review of Systems A comprehensive review of systems was negative.   Patient denies headache, fevers, malaise, unintentional weight loss, skin rash, eye pain, sinus congestion and sinus pain, sore throat, dysphagia,  hemoptysis , cough, dyspnea, wheezing, chest pain, palpitations, orthopnea, edema, abdominal pain, nausea, melena, diarrhea, constipation, flank pain, dysuria, hematuria, urinary  Frequency, nocturia, numbness, tingling, seizures,  Focal weakness, Loss of consciousness,  Tremor, insomnia, depression, anxiety, and suicidal ideation.      Objective:   BP 118/70  mmHg  Pulse 75  Temp(Src) 98.2 F (36.8 C) (Oral)  Resp 14  Ht 5' 5.25" (1.657 m)  Wt 173 lb (78.472 kg)  BMI 28.58 kg/m2  SpO2 97% General appearance: alert, cooperative and appears stated age Head: Normocephalic, without obvious abnormality, atraumatic Eyes: conjunctivae/corneas clear. PERRL, EOM's intact. Fundi benign. Ears: normal TM's and external ear canals both ears Nose: Nares normal. Septum midline. Mucosa normal. No drainage or sinus tenderness. Throat: lips, mucosa, and tongue normal; teeth and gums normal Neck: no adenopathy, no carotid bruit, no JVD, supple, symmetrical, trachea midline and thyroid not enlarged, symmetric, no tenderness/mass/nodules Lungs: clear to auscultation bilaterally Breasts: normal appearance, no masses or tenderness Heart: regular rate and rhythm, S1, S2 normal, no murmur, click, rub or gallop Abdomen: soft, non-tender; bowel sounds normal; no masses,  no organomegaly Extremities: extremities normal, atraumatic, no cyanosis or edema Pulses: 2+ and symmetric Skin: Skin color, texture, turgor normal. No rashes or lesions Neurologic: Alert and oriented X 3, normal strength and tone. Normal symmetric reflexes. Normal coordination and gait.   Assessment and Plan:   Problem List Items Addressed This Visit    Encounter for preventive health examination    Annual wellness  exam was done as well as a comprehensive physical exam and management of acute and chronic conditions .  During the course of the visit the patient was educated and counseled about appropriate screening and preventive services including :  diabetes screening, lipid analysis with projected  10 year  risk for CAD , nutrition counseling, colorectal cancer screening, and recommended immunizations.  Printed recommendations for health maintenance screenings was given.      GERD (gastroesophageal reflux disease)    Stopped prilosec,  Using organic apple cider vinegar     Tobacco abuse      reports that she has been smoking only 2 Cigarettes per day .   She has never used smokeless tobacco.Smoking cessation instruction/counseling given:  commended patient for quitting and reviewed strategies for preventing relapses     Other Visit Diagnoses    Thyromegaly    -  Primary    Relevant Orders       US Soft Tissue Head/Neck       TSH (Completed)    Breast cancer screening        Relevant Orders       MM DIGITAL SCREENING BILATERAL    Vitamin D deficiency        Relevant Orders       Vit D  25 hydroxy (rtn osteoporosis monitoring) (Completed)    Other fatigue        Relevant Orders       CBC with Differential (Completed)       Comprehensive metabolic panel (Completed)    Hyperlipidemia        Relevant Orders       Lipid panel (Completed)

## 2014-04-22 NOTE — Progress Notes (Signed)
Pre visit review using our clinic review tool, if applicable. No additional management support is needed unless otherwise documented below in the visit note. 

## 2014-04-22 NOTE — Assessment & Plan Note (Signed)
Stopped prilosec,  Using organic apple cider vinegar

## 2014-04-22 NOTE — Patient Instructions (Signed)

## 2014-04-23 ENCOUNTER — Telehealth: Payer: Self-pay | Admitting: Internal Medicine

## 2014-04-23 NOTE — Telephone Encounter (Signed)
emmi emailed °

## 2014-04-24 ENCOUNTER — Encounter: Payer: Self-pay | Admitting: Internal Medicine

## 2014-04-24 NOTE — Assessment & Plan Note (Signed)

## 2014-04-24 NOTE — Assessment & Plan Note (Signed)
reports that she has been smoking only 2 Cigarettes per day .   She has never used smokeless tobacco.Smoking cessation instruction/counseling given:  commended patient for quitting and reviewed strategies for preventing relapses

## 2014-04-27 ENCOUNTER — Encounter: Payer: Self-pay | Admitting: Internal Medicine

## 2014-04-28 ENCOUNTER — Ambulatory Visit: Payer: Self-pay | Admitting: Internal Medicine

## 2014-04-30 ENCOUNTER — Telehealth: Payer: Self-pay | Admitting: Internal Medicine

## 2014-04-30 DIAGNOSIS — E041 Nontoxic single thyroid nodule: Secondary | ICD-10-CM

## 2014-05-04 ENCOUNTER — Ambulatory Visit: Payer: Self-pay | Admitting: Internal Medicine

## 2014-05-04 LAB — HM MAMMOGRAPHY: HM MAMMO: NEGATIVE

## 2014-05-05 ENCOUNTER — Encounter: Payer: Self-pay | Admitting: *Deleted

## 2014-05-11 ENCOUNTER — Telehealth: Payer: Self-pay | Admitting: Internal Medicine

## 2014-05-11 NOTE — Telephone Encounter (Signed)
Faxed last [place] to [facility] for thyroid biopsy.

## 2014-05-13 ENCOUNTER — Ambulatory Visit: Payer: Self-pay | Admitting: Internal Medicine

## 2014-05-17 ENCOUNTER — Telehealth: Payer: Self-pay | Admitting: Internal Medicine

## 2014-05-17 DIAGNOSIS — E041 Nontoxic single thyroid nodule: Secondary | ICD-10-CM

## 2014-05-17 NOTE — Assessment & Plan Note (Signed)
Biopsy was nonmalignant  3/88/71 , benign follicular cells

## 2014-05-21 ENCOUNTER — Encounter: Payer: Self-pay | Admitting: Internal Medicine

## 2014-05-27 ENCOUNTER — Encounter: Payer: Self-pay | Admitting: Internal Medicine

## 2014-05-29 ENCOUNTER — Encounter: Payer: Self-pay | Admitting: Internal Medicine

## 2014-06-01 ENCOUNTER — Encounter: Payer: Self-pay | Admitting: Internal Medicine

## 2014-06-02 ENCOUNTER — Encounter: Payer: Self-pay | Admitting: Internal Medicine

## 2014-08-03 ENCOUNTER — Encounter: Payer: Self-pay | Admitting: *Deleted

## 2015-01-04 NOTE — Telephone Encounter (Signed)
Encounter closed

## 2015-05-12 ENCOUNTER — Encounter: Payer: Self-pay | Admitting: Internal Medicine

## 2015-05-12 ENCOUNTER — Ambulatory Visit (INDEPENDENT_AMBULATORY_CARE_PROVIDER_SITE_OTHER): Payer: 59 | Admitting: Internal Medicine

## 2015-05-12 VITALS — BP 120/78 | HR 62 | Temp 98.1°F | Resp 12 | Ht 65.0 in | Wt 177.2 lb

## 2015-05-12 DIAGNOSIS — E559 Vitamin D deficiency, unspecified: Secondary | ICD-10-CM

## 2015-05-12 DIAGNOSIS — E785 Hyperlipidemia, unspecified: Secondary | ICD-10-CM | POA: Diagnosis not present

## 2015-05-12 DIAGNOSIS — Z113 Encounter for screening for infections with a predominantly sexual mode of transmission: Secondary | ICD-10-CM

## 2015-05-12 DIAGNOSIS — Z0000 Encounter for general adult medical examination without abnormal findings: Secondary | ICD-10-CM

## 2015-05-12 DIAGNOSIS — R5383 Other fatigue: Secondary | ICD-10-CM | POA: Diagnosis not present

## 2015-05-12 DIAGNOSIS — E041 Nontoxic single thyroid nodule: Secondary | ICD-10-CM

## 2015-05-12 DIAGNOSIS — Z1239 Encounter for other screening for malignant neoplasm of breast: Secondary | ICD-10-CM | POA: Diagnosis not present

## 2015-05-12 DIAGNOSIS — M25519 Pain in unspecified shoulder: Secondary | ICD-10-CM | POA: Diagnosis not present

## 2015-05-12 DIAGNOSIS — Z720 Tobacco use: Secondary | ICD-10-CM

## 2015-05-12 NOTE — Progress Notes (Signed)
Patient ID: Pamela Lara, female    DOB: 07/16/1960  Age: 55 y.o. MRN: [id number]  The patient is here for annualwellness examination and management of other chronic and acute problems.   The risk factors are reflected in the social history.Marland Kitchen  Spent 3 minutes discussing risk of continued tobacco abuse, including but not limited to CAD, PAD, hypertension, and CA.  Sheis not interested in pharmacotherapy at this time, as she has reduce use to just two daily .  The roster of all physicians providing medical care to patient - is listed in the Snapshot section of the chart.  Activities of daily living:  The patient is 100% independent in all ADLs: dressing, toileting, feeding as well as independent mobility  Home safety : The patient has smoke detectors in the home. They wear seatbelts.  There are no firearms at home. There is no violence in the home.   There is no risks for hepatitis, STDs or HIV. There is no   history of blood transfusion. They have no travel history to infectious disease endemic areas of the world.  The patient has seen their dentist in the last six month. They have seen their eye doctor in the last year. They admit to slight hearing difficulty with regard to whispered voices and some television programs.  They have deferred audiologic testing in the last year.  They do not  have excessive sun exposure. Discussed the need for sun protection: hats, long sleeves and use of sunscreen if there is significant sun exposure.   Diet: the importance of a healthy diet is discussed. They do have a healthy diet.  The benefits of regular aerobic exercise were discussed. She walks 4 times per week ,  20 minutes.   Depression screen: there are no signs or vegative symptoms of depression- irritability, change in appetite, anhedonia, sadness/tearfullness.  Cognitive assessment: the patient manages all their financial and personal affairs and is actively engaged. They could relate  day,date,year and events; recalled 2/3 objects at 3 minutes; performed clock-face test normally.  The following portions of the patient's history were reviewed and updated as appropriate: allergies, current medications, past family history, past medical history,  past surgical history, past social history  and problem list.  Visual acuity was not assessed per patient preference since she has regular follow up with her ophthalmologist. Hearing and body mass index were assessed and reviewed.   During the course of the visit the patient was educated and counseled about appropriate screening and preventive services including : fall prevention , diabetes screening, nutrition counseling, colorectal cancer screening, and recommended immunizations.    CC: The primary encounter diagnosis was Breast cancer screening. Diagnoses of Pain, joint, shoulder, unspecified laterality, Hyperlipidemia, Vitamin D deficiency, Screening for STD (sexually transmitted disease), Other fatigue, Encounter for preventive health examination, Left thyroid nodule, and Tobacco abuse were also pertinent to this visit.  Taking apple cider vinegar and honey for control of REFLUX .   History Pamela Lara has a past medical history of GERD (gastroesophageal reflux disease); Seasonal affective disorder ([place]); and H. pylori infection.   She has past surgical history that includes Tubal ligation (2001); Joint replacement; Cholecystectomy (1984); and Tibia Osteotomy (Bilateral, 1980).   Her family history includes Arthritis in her father and mother; Cancer in her mother; Cancer (age of onset: 69) in her father; Hypertension in her father and mother.She reports that she has been smoking Cigarettes.  She has never used smokeless tobacco. She reports that she drinks alcohol.  She reports that she does not use illicit drugs.  Outpatient Prescriptions Prior to Visit  Medication Sig Dispense Refill  . Cholecalciferol (VITAMIN D3) 1000 UNITS CAPS  Take one by mouth daily    . Misc Natural Products (GLUCOSAMINE CHONDROITIN ADV PO) Take 1 tablet by mouth daily.    . Multiple Vitamin (MULTIVITAMIN) tablet Take 1 tablet by mouth daily.      . Omega-3 Fatty Acids (FISH OIL) 1000 MG CAPS Take 2 capsules by mouth daily.    Marland Kitchen omeprazole (PRILOSEC) 20 MG capsule Take 1 capsule by mouth 2 times daily. (Patient not taking: Reported on 05/12/2015) 180 capsule 3   No facility-administered medications prior to visit.    Review of Systems   Patient denies headache, fevers, malaise, unintentional weight loss, skin rash, eye pain, sinus congestion and sinus pain, sore throat, dysphagia,  hemoptysis , cough, dyspnea, wheezing, chest pain, palpitations, orthopnea, edema, abdominal pain, nausea, melena, diarrhea, constipation, flank pain, dysuria, hematuria, urinary  Frequency, nocturia, numbness, tingling, seizures,  Focal weakness, Loss of consciousness,  Tremor, insomnia, depression, anxiety, and suicidal ideation.      Objective:  BP 120/78 mmHg  Pulse 62  Temp(Src) 98.1 F (36.7 C) (Oral)  Resp 12  Ht 5\' 5"  (1.651 m)  Wt 177 lb 4 oz (80.4 kg)  BMI 29.50 kg/m2  SpO2 98%  LMP 01/30/2015 (Approximate)  Physical Exam  General appearance: alert, cooperative and appears stated age Head: Normocephalic, without obvious abnormality, atraumatic Eyes: conjunctivae/corneas clear. PERRL, EOM's intact. Fundi benign. Ears: normal TM's and external ear canals both ears Nose: Nares normal. Septum midline. Mucosa normal. No drainage or sinus tenderness. Throat: lips, mucosa, and tongue normal; teeth and gums normal Neck: no adenopathy, no carotid bruit, no JVD, supple, symmetrical, trachea midline and thyroid not enlarged, symmetric, no tenderness/mass/nodules Lungs: clear to auscultation bilaterally Breasts: normal appearance, no masses or tenderness Heart: regular rate and rhythm, S1, S2 normal, no murmur, click, rub or gallop Abdomen: soft,  non-tender; bowel sounds normal; no masses,  no organomegaly Extremities: extremities normal, atraumatic, no cyanosis or edema Pulses: 2+ and symmetric Skin: Skin color, texture, turgor normal. No rashes or lesions Neurologic: Alert and oriented X 3, normal strength and tone. Normal symmetric reflexes. Normal coordination and gait.    Assessment & Plan:   Problem List Items Addressed This Visit    Tobacco abuse    She has reduced her use to two cigarettes daily.  Smoking cessation instruction/counseling given: commended patient for reducing daily use. And encouraged  Patient to continue reduction in daily use by 1 cigarette every week      Encounter for preventive health examination    Annual comprehensive preventive exam was done as well as an evaluation and management of chronic conditions .  During the course of the visit the patient was educated and counseled about appropriate screening and preventive services including :  diabetes screening, lipid analysis with projected  10 year  risk for CAD of 6.25%  , nutrition counseling, colorectal cancer screening, and recommended immunizations.  Printed recommendations for health maintenance screenings was given.   Lab Results  Component Value Date   CHOL 255* 05/12/2015   HDL 50.30 05/12/2015   LDLCALC 107* 04/02/2013   LDLDIRECT 171.0 05/12/2015   TRIG 236.0* 05/12/2015   CHOLHDL 5 05/12/2015         Left thyroid nodule    Benign biopsy 2015.  thyrid function is normal.  Lab Results  Component Value Date  TSH 0.75 05/12/2015          Other Visit Diagnoses    Breast cancer screening    -  Primary    Relevant Orders    MM DIGITAL SCREENING BILATERAL    Pain, joint, shoulder, unspecified laterality        Relevant Orders    Sedimentation rate (Completed)    Hyperlipidemia        Relevant Orders    Lipid panel (Completed)    Vitamin D deficiency        Relevant Orders    VITAMIN D 25 Hydroxy (Vit-D Deficiency, Fractures)  (Completed)    Screening for STD (sexually transmitted disease)        Relevant Orders    Hepatitis C antibody (Completed)    HIV antibody (Completed)    Other fatigue        Relevant Orders    Comprehensive metabolic panel (Completed)    TSH (Completed)       I am having Ms. Sharlett Iles maintain her multivitamin, Vitamin D3, Fish Oil, omeprazole, and Misc Natural Products (GLUCOSAMINE CHONDROITIN ADV PO).  No orders of the defined types were placed in this encounter.    There are no discontinued medications.  Follow-up: No Follow-up on file.   Crecencio Mc, MD

## 2015-05-12 NOTE — Progress Notes (Signed)
Pre-visit discussion using our clinic review tool. No additional management support is needed unless otherwise documented below in the visit note.  

## 2015-05-13 LAB — COMPREHENSIVE METABOLIC PANEL
ALT: 79 U/L — ABNORMAL HIGH (ref 0–35)
AST: 34 U/L (ref 0–37)
Albumin: 4.5 g/dL (ref 3.5–5.2)
Alkaline Phosphatase: 56 U/L (ref 39–117)
BUN: 10 mg/dL (ref 6–23)
CHLORIDE: 104 meq/L (ref 96–112)
CO2: 26 mEq/L (ref 19–32)
CREATININE: 0.7 mg/dL (ref 0.40–1.20)
Calcium: 9.9 mg/dL (ref 8.4–10.5)
GFR: 92.61 mL/min (ref >60.00)
Glucose, Bld: 95 mg/dL (ref 70–99)
Potassium: 4 mEq/L (ref 3.5–5.1)
SODIUM: 138 meq/L (ref 135–145)
Total Bilirubin: 0.8 mg/dL (ref 0.2–1.2)
Total Protein: 6.8 g/dL (ref 6.0–8.3)

## 2015-05-13 LAB — LIPID PANEL
CHOL/HDL RATIO: 5
CHOLESTEROL: 255 mg/dL — AB (ref 0–200)
HDL: 50.3 mg/dL (ref >39.00)
NonHDL: 204.47
Triglycerides: 236 mg/dL — ABNORMAL HIGH (ref 0.0–149.0)
VLDL: 47.2 mg/dL — ABNORMAL HIGH (ref 0.0–40.0)

## 2015-05-13 LAB — VITAMIN D 25 HYDROXY (VIT D DEFICIENCY, FRACTURES): VITD: 35.75 ng/mL (ref 30.00–100.00)

## 2015-05-13 LAB — TSH: TSH: 0.75 u[IU]/mL (ref 0.35–4.50)

## 2015-05-13 LAB — HEPATITIS C ANTIBODY: HCV AB: NEGATIVE

## 2015-05-13 LAB — HIV ANTIBODY (ROUTINE TESTING W REFLEX): HIV 1&2 Ab, 4th Generation: NONREACTIVE

## 2015-05-13 LAB — SEDIMENTATION RATE: Sed Rate: 7 mm/hr (ref 0–22)

## 2015-05-13 LAB — LDL CHOLESTEROL, DIRECT: Direct LDL: 171 mg/dL

## 2015-05-15 ENCOUNTER — Encounter: Payer: Self-pay | Admitting: Internal Medicine

## 2015-05-15 NOTE — Assessment & Plan Note (Signed)
Benign biopsy 2015.  thyrid function is normal.  Lab Results  Component Value Date   TSH 0.75 05/12/2015

## 2015-05-15 NOTE — Assessment & Plan Note (Addendum)
Annual comprehensive preventive exam was done as well as an evaluation and management of chronic conditions .  During the course of the visit the patient was educated and counseled about appropriate screening and preventive services including :  diabetes screening, lipid analysis with projected  10 year  risk for CAD of 6.25%  , nutrition counseling, colorectal cancer screening, and recommended immunizations.  Printed recommendations for health maintenance screenings was given.   Lab Results  Component Value Date   CHOL 255* 05/12/2015   HDL 50.30 05/12/2015   LDLCALC 107* 04/02/2013   LDLDIRECT 171.0 05/12/2015   TRIG 236.0* 05/12/2015   CHOLHDL 5 05/12/2015

## 2015-05-15 NOTE — Assessment & Plan Note (Signed)
She has reduced her use to two cigarettes daily.  Smoking cessation instruction/counseling given: commended patient for reducing daily use. And encouraged  Patient to continue reduction in daily use by 1 cigarette every week

## 2015-05-15 NOTE — Patient Instructions (Signed)

## 2015-05-16 ENCOUNTER — Other Ambulatory Visit: Payer: Self-pay | Admitting: Internal Medicine

## 2015-05-16 ENCOUNTER — Encounter: Payer: Self-pay | Admitting: Internal Medicine

## 2015-05-16 DIAGNOSIS — R7401 Elevation of levels of liver transaminase levels: Secondary | ICD-10-CM | POA: Insufficient documentation

## 2015-05-16 DIAGNOSIS — R740 Nonspecific elevation of levels of transaminase and lactic acid dehydrogenase [LDH]: Secondary | ICD-10-CM

## 2015-08-24 ENCOUNTER — Ambulatory Visit: Payer: Self-pay | Admitting: Physician Assistant

## 2015-08-24 ENCOUNTER — Encounter: Payer: Self-pay | Admitting: Physician Assistant

## 2015-08-24 VITALS — BP 114/70 | HR 78 | Temp 98.6°F

## 2015-08-24 DIAGNOSIS — M25551 Pain in right hip: Secondary | ICD-10-CM

## 2015-08-24 MED ORDER — MELOXICAM 15 MG PO TABS: 15.0000 mg | ORAL_TABLET | Freq: Every day | ORAL | Status: DC

## 2015-08-24 NOTE — Progress Notes (Signed)
S: c/o r hip/groin pain for 2 days, sharp pain and is worse when she extends her leg backwards like a lunge, no known injury, has been walking since Jan to lose weight, alternates surfaces from pavement to grass, denies back /knee pain  O: vitals wnl, nad, hip neg for bony tenderness, full rom, pain reproduced on internal rotation, legs = length, n/v intact  A: acute r hip pain  P: mobic 15mg  qd, ice, if not better in 5 - 7 days can refer to ortho or xray, explained stretches, avoid hard surfaces while walking, would recommend she only do non weight bearing exercises until area is healing

## 2015-09-21 ENCOUNTER — Other Ambulatory Visit (INDEPENDENT_AMBULATORY_CARE_PROVIDER_SITE_OTHER): Payer: 59

## 2015-09-21 DIAGNOSIS — R740 Nonspecific elevation of levels of transaminase and lactic acid dehydrogenase [LDH]: Secondary | ICD-10-CM | POA: Diagnosis not present

## 2015-09-21 DIAGNOSIS — R7401 Elevation of levels of liver transaminase levels: Secondary | ICD-10-CM

## 2015-09-21 LAB — HEPATIC FUNCTION PANEL
ALBUMIN: 4.3 g/dL (ref 3.5–5.2)
ALT: 31 U/L (ref 0–35)
AST: 22 U/L (ref 0–37)
Alkaline Phosphatase: 46 U/L (ref 39–117)
Bilirubin, Direct: 0.1 mg/dL (ref 0.0–0.3)
Total Bilirubin: 0.6 mg/dL (ref 0.2–1.2)
Total Protein: 6.6 g/dL (ref 6.0–8.3)

## 2015-09-22 LAB — HEPATITIS PANEL, ACUTE
HCV Ab: NEGATIVE
HEP B S AG: NEGATIVE
Hep A IgM: NONREACTIVE
Hep B C IgM: NONREACTIVE

## 2015-09-25 ENCOUNTER — Encounter: Payer: Self-pay | Admitting: Internal Medicine

## 2015-11-05 ENCOUNTER — Encounter: Payer: Self-pay | Admitting: Internal Medicine

## 2015-11-05 ENCOUNTER — Ambulatory Visit: Payer: Self-pay | Admitting: Physician Assistant

## 2015-11-05 ENCOUNTER — Encounter: Payer: Self-pay | Admitting: Physician Assistant

## 2015-11-05 VITALS — BP 110/80 | HR 60 | Temp 98.4°F

## 2015-11-05 DIAGNOSIS — H60331 Swimmer's ear, right ear: Secondary | ICD-10-CM

## 2015-11-05 MED ORDER — NEOMYCIN-POLYMYXIN-HC 3.5-10000-1 OT SOLN: 3.0000 [drp] | Freq: Four times a day (QID) | OTIC | Status: DC

## 2015-11-05 NOTE — Telephone Encounter (Signed)
Can we get patient in Monday are with Arnett?

## 2015-11-05 NOTE — Telephone Encounter (Signed)
Would you need to see patient ?

## 2015-11-05 NOTE — Telephone Encounter (Signed)
This patient can be seen by Arnett. Please advise if a prescriptions needs to be called in or pt needs to be seen?

## 2015-11-05 NOTE — Progress Notes (Signed)
S: c/o r ear pain, has been in the pool a lot, thinks she has swimmers ear, no drainage from ear, no fever/chills  O: vitals wnl, nad, r ear canal a little red and irritated, tenderness with palp tragus, tms wnl, neck supple no lymph lungs c t a, cv rrr  A: swimmers ear  P: cortisporin otic solution

## 2016-03-13 DIAGNOSIS — H5203 Hypermetropia, bilateral: Secondary | ICD-10-CM | POA: Diagnosis not present

## 2016-04-02 ENCOUNTER — Emergency Department: Payer: 59

## 2016-04-02 ENCOUNTER — Emergency Department
Admission: EM | Admit: 2016-04-02 | Discharge: 2016-04-02 | Disposition: A | Discharge status: Home / Self Care | Payer: 59 | Attending: Emergency Medicine | Admitting: Emergency Medicine

## 2016-04-02 DIAGNOSIS — R1032 Left lower quadrant pain: Secondary | ICD-10-CM | POA: Diagnosis not present

## 2016-04-02 DIAGNOSIS — Z79899 Other long term (current) drug therapy: Secondary | ICD-10-CM | POA: Insufficient documentation

## 2016-04-02 DIAGNOSIS — K760 Fatty (change of) liver, not elsewhere classified: Secondary | ICD-10-CM | POA: Diagnosis not present

## 2016-04-02 DIAGNOSIS — F17210 Nicotine dependence, cigarettes, uncomplicated: Secondary | ICD-10-CM | POA: Diagnosis not present

## 2016-04-02 DIAGNOSIS — K5732 Diverticulitis of large intestine without perforation or abscess without bleeding: Secondary | ICD-10-CM | POA: Insufficient documentation

## 2016-04-02 LAB — URINALYSIS COMPLETE WITH MICROSCOPIC (ARMC ONLY)
Bilirubin Urine: NEGATIVE
Glucose, UA: NEGATIVE mg/dL
KETONES UR: NEGATIVE mg/dL
LEUKOCYTES UA: NEGATIVE
NITRITE: NEGATIVE
PH: 5 (ref 5.0–8.0)
PROTEIN: NEGATIVE mg/dL
SPECIFIC GRAVITY, URINE: 1.009 (ref 1.005–1.030)

## 2016-04-02 LAB — CBC
HEMATOCRIT: 40 % (ref 35.0–47.0)
HEMOGLOBIN: 14.3 g/dL (ref 12.0–16.0)
MCH: 31.9 pg (ref 26.0–34.0)
MCHC: 35.8 g/dL (ref 32.0–36.0)
MCV: 89.1 fL (ref 80.0–100.0)
Platelets: 182 10*3/uL (ref 150–440)
RBC: 4.49 MIL/uL (ref 3.80–5.20)
RDW: 12.5 % (ref 11.5–14.5)
WBC: 14.4 10*3/uL — ABNORMAL HIGH (ref 3.6–11.0)

## 2016-04-02 LAB — COMPREHENSIVE METABOLIC PANEL
ALBUMIN: 3.9 g/dL (ref 3.5–5.0)
ALK PHOS: 52 U/L (ref 38–126)
ALT: 57 U/L — ABNORMAL HIGH (ref 14–54)
ANION GAP: 7 (ref 5–15)
AST: 34 U/L (ref 15–41)
BILIRUBIN TOTAL: 0.8 mg/dL (ref 0.3–1.2)
BUN: 8 mg/dL (ref 6–20)
CALCIUM: 8.9 mg/dL (ref 8.9–10.3)
CO2: 23 mmol/L (ref 22–32)
Chloride: 105 mmol/L (ref 101–111)
Creatinine, Ser: 0.61 mg/dL (ref 0.44–1.00)
GFR calc non Af Amer: >60 mL/min (ref >60)
GLUCOSE: 103 mg/dL — AB (ref 65–99)
POTASSIUM: 3.8 mmol/L (ref 3.5–5.1)
SODIUM: 135 mmol/L (ref 135–145)
TOTAL PROTEIN: 7.1 g/dL (ref 6.5–8.1)

## 2016-04-02 LAB — LIPASE, BLOOD: Lipase: 25 U/L (ref 11–51)

## 2016-04-02 MED ORDER — METRONIDAZOLE 500 MG PO TABS
ORAL_TABLET | ORAL | Status: AC
  Administered 2016-04-02: 500 mg via ORAL
  Filled 2016-04-02: qty 1

## 2016-04-02 MED ORDER — METRONIDAZOLE 500 MG PO TABS: 500.0000 mg | ORAL_TABLET | Freq: Three times a day (TID) | ORAL | 0 refills | Status: AC

## 2016-04-02 MED ORDER — BARIUM SULFATE 2.1 % PO SUSP
450.0000 mL | ORAL | Status: AC
  Administered 2016-04-02: 450 mL via ORAL

## 2016-04-02 MED ORDER — CIPROFLOXACIN HCL 500 MG PO TABS
500.0000 mg | ORAL_TABLET | Freq: Once | ORAL | Status: AC
  Administered 2016-04-02: 500 mg via ORAL

## 2016-04-02 MED ORDER — CIPROFLOXACIN HCL 500 MG PO TABS: 500.0000 mg | ORAL_TABLET | Freq: Two times a day (BID) | ORAL | 0 refills | Status: DC

## 2016-04-02 MED ORDER — METRONIDAZOLE 500 MG PO TABS
500.0000 mg | ORAL_TABLET | Freq: Once | ORAL | Status: AC
  Administered 2016-04-02: 500 mg via ORAL

## 2016-04-02 MED ORDER — HYDROCODONE-ACETAMINOPHEN 5-325 MG PO TABS: 1.0000 | ORAL_TABLET | ORAL | 0 refills | Status: DC | PRN

## 2016-04-02 MED ORDER — CIPROFLOXACIN HCL 500 MG PO TABS
ORAL_TABLET | ORAL | Status: AC
  Administered 2016-04-02: 500 mg via ORAL
  Filled 2016-04-02: qty 1

## 2016-04-02 NOTE — ED Triage Notes (Signed)
LLQ abdominal pain X 3 days, worsening yesterday after she felt a "jab". C/o pain with urination.

## 2016-04-02 NOTE — ED Notes (Signed)
Pt alert and oriented X4, active, cooperative, pt in NAD. RR even and unlabored, color WNL.  Pt informed to return if any life threatening symptoms occur.   

## 2016-04-02 NOTE — Discharge Instructions (Signed)
Please take your antibiotics as prescribed, pain medication as needed, as written. Please follow-up with your primary care doctor in 1 week for recheck/reevaluation. Return to the emergency department for any sudden worsening of abdominal pain, or for significant fever.

## 2016-04-02 NOTE — ED Provider Notes (Signed)
[facility] Emergency Department Provider Note  Time seen: 8:15 AM  I have reviewed the triage vital signs and the nursing notes.   HISTORY  Chief Complaint Abdominal Pain    HPI Pamela Lara is a 55 y.o. female with a past medical history of gastric reflux, presents to the emergency department left lower quadrant abdominal pain. According to the patient over the past 2 days she has been experiencing sharp intermittent left lower quadrant abdominal pain and low-grade fevers to 99.5 at home. Denies any nausea or vomiting, does state decreased appetite. Denies any black or bloody stool. Denies any diarrhea. Denies dysuria but states she does experience left lower quadrant pain while urinating. Denies any history of diverticulitis/colitis in the past. Currently describes her pain as mild, moderate to significant with any movement and pushing on the area.  Past Medical History:  Diagnosis Date  . GERD (gastroesophageal reflux disease)   . H. pylori infection    with colonoscopy  . Seasonal affective disorder [facility])     Patient Active Problem List   Diagnosis Date Noted  . Elevated ALT measurement 05/16/2015  . Left thyroid nodule 04/30/2014  . Cervical cancer screening 04/05/2013  . GERD (gastroesophageal reflux disease) 04/24/2012  . External hemorrhoid 04/24/2012  . Encounter for preventive health examination 03/29/2012  . Menorrhagia, premenopausal 06/25/2011  . Tobacco abuse 12/21/2010    Past Surgical History:  Procedure Laterality Date  . CHOLECYSTECTOMY  1984   open  . 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 TIBIA OSTEOTOMY Bilateral 1980   done 1 year apart   . TUBAL LIGATION  2001   secondary to ectopic    Prior to Admission medications   Medication Sig Start Date End Date Taking? Authorizing Provider  Cholecalciferol (VITAMIN D3) 1000 UNITS CAPS Take one by mouth daily    Historical  Provider, MD  meloxicam (MOBIC) 15 MG tablet Take 1 tablet (15 mg total) by mouth daily. 08/24/15   Versie Starks, PA-C  Misc Natural Products (GLUCOSAMINE CHONDROITIN ADV PO) Take 1 tablet by mouth daily.    Historical Provider, MD  Multiple Vitamin (MULTIVITAMIN) tablet Take 1 tablet by mouth daily.      Historical Provider, MD  neomycin-polymyxin-hydrocortisone (CORTISPORIN) otic solution Place 3 drops into the right ear 4 (four) times daily. 11/05/15   Versie Starks, PA-C  Omega-3 Fatty Acids (FISH OIL) 1000 MG CAPS Take 2 capsules by mouth daily.    Historical Provider, MD  omeprazole (PRILOSEC) 20 MG capsule Take 1 capsule by mouth 2 times daily. Patient not taking: Reported on 05/12/2015 12/29/13   Crecencio Mc, MD    Allergies  Allergen Reactions  . Ivp Dye [Iodinated Diagnostic Agents] Shortness Of Breath  . Morphine And Related Nausea And Vomiting  . Wellbutrin [Bupropion] Other (See Comments)    Joint pain    Family History  Problem Relation Age of Onset  . Cancer Mother   . Arthritis Mother   . Hypertension Mother   . Cancer Father 24    Mesothelioma  . Arthritis Father   . Hypertension Father     Social History Social History  Substance Use Topics  . Smoking status: Current Every Day Smoker    Types: Cigarettes  . Smokeless tobacco: Never Used     Comment:  just 2 cigarettes per day.  . Alcohol use Yes     Comment: occasional  Review of Systems Constitutional: Low-grade fever. Cardiovascular: Negative for chest pain. Respiratory: Negative for shortness of breath. Gastrointestinal: Left lower quadrant abdominal pain. Negative for nausea, vomiting, diarrhea. Genitourinary: Denies dysuria but states left lower quadrant pain with Valsalva maneuver. Musculoskeletal: Negative for back pain. 10-point ROS otherwise negative.  ____________________________________________   PHYSICAL EXAM:  VITAL SIGNS: ED Triage Vitals [04/02/16 0753]  Enc Vitals Group      BP 92/60     Pulse Rate 90     Resp 18     Temp 99.2 F (37.3 C)     Temp Source Oral     SpO2 97 %     Weight 173 lb (78.5 kg)     Height 5\' 4"  (1.626 m)     Head Circumference      Peak Flow      Pain Score 5     Pain Loc      Pain Edu?      Excl. in [place]?     Constitutional: Alert and oriented. Well appearing and in no distress. Eyes: Normal exam ENT   Head: Normocephalic and atraumatic   Mouth/Throat: Mucous membranes are moist. Cardiovascular: Normal rate, regular rhythm. No murmur Respiratory: Normal respiratory effort without tachypnea nor retractions. Breath sounds are clear  Gastrointestinal: Soft, moderate left lower quadrant tenderness to palpation, mild rebound, mild local guarding. No distention. No CVA tenderness. Musculoskeletal: Nontender with normal range of motion in all extremities.  Neurologic:  Normal speech and language. No gross focal neurologic deficits  Skin:  Skin is warm, dry and intact.  Psychiatric: Mood and affect are normal.   ____________________________________________     RADIOLOGY  CT consistent with uncomfortable diverticulitis  ____________________________________________   INITIAL IMPRESSION / ASSESSMENT AND PLAN / ED COURSE  Pertinent labs & imaging results that were available during my care of the patient were reviewed by me and considered in my medical decision making (see chart for details).  The patient presents to the emergency department left lower quadrant abdominal pain. Moderate tenderness to palpation with localized voluntary guarding and rebound. We will obtain labs, obtaining CT scan the abdomen/pelvis to further evaluate. Patient is in no distress, appears calm and comfortable when not pressing on the abdomen.  Patient CT scan is consistent with uncomplicated diverticulitis. Overall the patient continues to appear well. We'll discharge with a short course of pain medication, ciprofloxacin and Flagyl. Patient will  follow-up with her primary care doctor. I discussed return precautions including worsening pain or fever. Patient agreeable to plan. ____________________________________________   FINAL CLINICAL IMPRESSION(S) / ED DIAGNOSES  Left lower quadrant abdominal pain Diverticulitis   Harvest Dark, MD 04/02/16 1044

## 2016-04-04 ENCOUNTER — Telehealth: Payer: Self-pay | Admitting: *Deleted

## 2016-04-04 NOTE — Telephone Encounter (Signed)
Pt was advised to make a one week follow, she was seen in the  [facility] emergency room on 12/3 for diverticulitis. Patient will be available on Monday. Pleas give a time and date for pt to follow up Pt contact [phone]

## 2016-04-04 NOTE — Telephone Encounter (Signed)
Pt was only available to come in on Dec 8th or Dec 11 , otherwise she will have to work.

## 2016-04-04 NOTE — Telephone Encounter (Signed)
Please call patient and schedule 04/13/16 at 11.30, I have hold on that spot.

## 2016-04-05 NOTE — Telephone Encounter (Signed)
Try Monday at 6.30

## 2016-04-05 NOTE — Telephone Encounter (Signed)
FYI There was an opening at 6 pm, I left  Message to make sure this appt would work for her

## 2016-04-06 NOTE — Telephone Encounter (Signed)
Patient declines any congestion and cough.  She states she was is worried the Cipro I bid and Flagyl 1 q 8 h not helping to resolve diverticulits.  Still having abdominal pain. No nausea or vomiting.   Please advise.

## 2016-04-06 NOTE — Telephone Encounter (Signed)
Patient advised of below and verbalized understanding.  

## 2016-04-06 NOTE — Telephone Encounter (Signed)
Patient currently reported that she's  not feeling any better, she stated that she has a low grade fever of 99,body aches and fatigue. She questioned if she should be seen sooner.  Pt contact [phone]

## 2016-04-06 NOTE — Telephone Encounter (Signed)
The symptoms she is describing have little to do with diverticulitis and sound more like a viral URI. Please ask if she is  Nauseated,  vomiting and is she moving her bowels daily   solid or liquid? Any abdominal pain ?

## 2016-04-06 NOTE — Telephone Encounter (Signed)
Diverticulitis does not resolve overnight or even in 3 days. As long as she is moving her bowels daily ,  Not vomiting, following a clear liquid diet and taking her antibiotics as directed , Monday is fine. HOwever if she develops severe abdominal pain, or a temp > 100.4, she should be seen here or elsewhere for assessment.

## 2016-04-06 NOTE — Telephone Encounter (Signed)
Left message to call at [phone]

## 2016-04-07 ENCOUNTER — Ambulatory Visit: Payer: Self-pay | Admitting: Physician Assistant

## 2016-04-10 ENCOUNTER — Ambulatory Visit (INDEPENDENT_AMBULATORY_CARE_PROVIDER_SITE_OTHER): Payer: 59 | Admitting: Internal Medicine

## 2016-04-10 VITALS — BP 116/82 | HR 81 | Temp 98.3°F | Resp 14 | Wt 169.0 lb

## 2016-04-10 DIAGNOSIS — R7401 Elevation of levels of liver transaminase levels: Secondary | ICD-10-CM

## 2016-04-10 DIAGNOSIS — K5732 Diverticulitis of large intestine without perforation or abscess without bleeding: Secondary | ICD-10-CM | POA: Diagnosis not present

## 2016-04-10 DIAGNOSIS — R740 Nonspecific elevation of levels of transaminase and lactic acid dehydrogenase [LDH]: Secondary | ICD-10-CM

## 2016-04-10 NOTE — Patient Instructions (Signed)
Continue the cipro and flagyl  You can try advancing your diet to chicken and rice with broth, mashed potatoes,  No dairy,  No salad or greens. .  If the abdominal pain worsens after eating this type of diet, we will need to repeat the CT scan

## 2016-04-10 NOTE — Progress Notes (Signed)
Subjective:  Patient ID: Pamela Lara, female    DOB: 07/26/1960  Age: 55 y.o. MRN: [id number]  CC: The primary encounter diagnosis was Diverticulitis of colon without hemorrhage. A diagnosis of Elevated ALT measurement was also pertinent to this visit.  HPI: Pamela Lara presents for ER follow up .  Presented to ED  On Dec 3 with 2 day history of crampy  LLQ pain accompanied by low grade fevers and was diagnosed with uncomplicated diverticulitis of the descending and sigmoid colon by  CT .  She was sent out with cipro and flagyl , Vicodin, clear liquid diet instructions and a 2 day work excuse note. Advised to follow up in one week with me Has been taking the prescribed cipro and flagyl  Since dec 3,  Spent two days at home.  When she  Returned to work she advanced diet to soft  which was not tolerated.  Sent home by Dr Register and spent the next 4 days at home on a clear liquid diet . NO fevers ,    Bowels movements ave been soft and stringy  3-4  Daily.  Small unformed   On Day 9 of 14 .    CT also noted appearance of fatty liver  without lesions .   Outpatient Medications Prior to Visit  Medication Sig Dispense Refill  . Cholecalciferol (VITAMIN D3) 1000 UNITS CAPS Take one by mouth daily    . ciprofloxacin (CIPRO) 500 MG tablet Take 1 tablet (500 mg total) by mouth 2 (two) times daily. 28 tablet 0  . HYDROcodone-acetaminophen (NORCO/VICODIN) 5-325 MG tablet Take 1 tablet by mouth every 4 (four) hours as needed. 15 tablet 0  . metroNIDAZOLE (FLAGYL) 500 MG tablet Take 1 tablet (500 mg total) by mouth 3 (three) times daily. 42 tablet 0  . Multiple Vitamin (MULTIVITAMIN) tablet Take 1 tablet by mouth daily.      . Omega-3 Fatty Acids (FISH OIL) 1000 MG CAPS Take 2 capsules by mouth daily.     No facility-administered medications prior to visit.     Review of Systems;  Patient denies headache, fevers, malaise, unintentional weight loss, skin rash, eye pain, sinus congestion  and sinus pain, sore throat, dysphagia,  hemoptysis , cough, dyspnea, wheezing, chest pain, palpitations, orthopnea, edema, abdominal pain, nausea, melena, diarrhea, constipation, flank pain, dysuria, hematuria, urinary  Frequency, nocturia, numbness, tingling, seizures,  Focal weakness, Loss of consciousness,  Tremor, insomnia, depression, anxiety, and suicidal ideation.      Objective:  BP 116/82   Pulse 81   Temp 98.3 F (36.8 C) (Oral)   Resp 14   Wt 169 lb (76.7 kg)   LMP 10/03/2015 (Approximate)   SpO2 96%   BMI 29.01 kg/m   BP Readings from Last 3 Encounters:  04/10/16 116/82  04/02/16 126/76  11/05/15 110/80    Wt Readings from Last 3 Encounters:  04/10/16 169 lb (76.7 kg)  04/02/16 173 lb (78.5 kg)  05/12/15 177 lb 4 oz (80.4 kg)    General appearance: alert, cooperative and appears stated age Ears: normal TM's and external ear canals both ears Throat: lips, mucosa, and tongue normal; teeth and gums normal Neck: no adenopathy, no carotid bruit, supple, symmetrical, trachea midline and thyroid not enlarged, symmetric, no tenderness/mass/nodules Back: symmetric, no curvature. ROM normal. No CVA tenderness. Lungs: clear to auscultation bilaterally Heart: regular rate and rhythm, S1, S2 normal, no murmur, click, rub or gallop Abdomen: soft, tender to deep palpation in  LLQ without gaurding  Or rebound , bowel sounds normal; no masses,  no organomegaly Pulses: 2+ and symmetric Skin: Skin color, texture, turgor normal. No rashes or lesions Lymph nodes: Cervical, supraclavicular, and axillary nodes normal.  No results found for: HGBA1C  Lab Results  Component Value Date   CREATININE 0.74 04/10/2016   CREATININE 0.61 04/02/2016   CREATININE 0.70 05/12/2015    Lab Results  Component Value Date   WBC 8.3 04/10/2016   HGB 14.7 04/10/2016   HCT 42.9 04/10/2016   PLT 236.0 04/10/2016   GLUCOSE 91 04/10/2016   CHOL 255 (H) 05/12/2015   TRIG 236.0 (H) 05/12/2015    HDL 50.30 05/12/2015   LDLDIRECT 171.0 05/12/2015   LDLCALC 107 (H) 04/02/2013   ALT 54 (H) 04/10/2016   AST 27 04/10/2016   NA 139 04/10/2016   K 3.6 04/10/2016   CL 103 04/10/2016   CREATININE 0.74 04/10/2016   BUN 6 04/10/2016   CO2 27 04/10/2016   TSH 0.75 05/12/2015   INR 1.01 04/02/2013    Ct Abdomen Pelvis Wo Contrast  Result Date: 04/02/2016 CLINICAL DATA:  55 year old female with history of abdominal pain for the past 3 days, worsening yesterday. EXAM: CT ABDOMEN AND PELVIS WITHOUT CONTRAST TECHNIQUE: Multidetector CT imaging of the abdomen and pelvis was performed following the standard protocol without IV contrast. COMPARISON:  No priors. FINDINGS: Lower chest: Unremarkable. Hepatobiliary: Diffuse low attenuation throughout the hepatic parenchyma, compatible with a background of hepatic steatosis. No discrete cystic or solid hepatic lesions are confidently identified on today's noncontrast CT examination. Status post cholecystectomy. Pancreas: No definite pancreatic mass or peripancreatic inflammatory changes are noted on today's noncontrast CT examination. Spleen: Unremarkable. Adrenals/Urinary Tract: Sub cm low-attenuation lesion in the upper pole the left kidney is incompletely characterized on today's noncontrast CT examination, but is statistically likely a cyst. Right kidney and bilateral adrenal glands are normal in appearance. No hydroureteronephrosis. Unenhanced appearance of the urinary bladder is normal. Stomach/Bowel: Normal appearance of the stomach. No pathologic dilatation of small bowel or colon. Numerous colonic diverticulae are noted. In the distal descending colon just before transition to the sigmoid colon there is extensive inflammation around a diverticulum, best appreciated on image 53 of series 2. Profound colonic wall thickening is noted in the adjacent regions, and there is extensive surrounding inflammatory changes. No discrete diverticular abscess is noted. No  definite signs of frank perforation of bowel. Normal appendix. Vascular/Lymphatic: No atherosclerotic calcifications or definite aneurysm identified in the abdominal or pelvic vasculature. No lymphadenopathy noted in the abdomen or pelvis. Reproductive: Unenhanced appearance of the uterus and ovaries is unremarkable. Other: No significant volume of ascites.  No pneumoperitoneum. Musculoskeletal: There are no aggressive appearing lytic or blastic lesions noted in the visualized portions of the skeleton. IMPRESSION: 1. Findings are compatible with acute diverticulitis of the distal descending colon, as above. No discrete diverticular abscess or signs of frank perforation are noted at this time. 2. Hepatic steatosis. Electronically Signed   By: Vinnie Langton M.D.   On: 04/02/2016 10:36    Assessment & Plan:   Problem List Items Addressed This Visit    Diverticulitis of colon without hemorrhage - Primary    Diagnosed on Dec 3 by CT with descending and sigmoid colonic all thickening with inflammatory changes, leukocytosis .  She is on Day 9 of 14.  Repeat CBC , ESR abd CRP are notmal.   Lab Results  Component Value Date   WBC 8.3 04/10/2016  HGB 14.7 04/10/2016   HCT 42.9 04/10/2016   MCV 88.6 04/10/2016   PLT 236.0 04/10/2016   Lab Results  Component Value Date   ESRSEDRATE 24 04/10/2016   Lab Results  Component Value Date   CRP 0.7 04/10/2016   .       Relevant Orders   Sedimentation rate (Completed)   C-reactive protein (Completed)   Comprehensive metabolic panel (Completed)   CBC with Differential/Platelet (Completed)   DG Abd 2 Views (Completed)   Elevated ALT measurement    Persistent, Likely secondary to fatty liver as suggested by CT.  Brief discussion with patient today.  Lab Results  Component Value Date   ALT 54 (H) 04/10/2016   AST 27 04/10/2016   ALKPHOS 54 04/10/2016   BILITOT 0.5 04/10/2016             I am having Pamela Lara maintain her  multivitamin, Vitamin D3, Fish Oil, ciprofloxacin, metroNIDAZOLE, and HYDROcodone-acetaminophen.  No orders of the defined types were placed in this encounter.   There are no discontinued medications.  Follow-up: No Follow-up on file.   Crecencio Mc, MD

## 2016-04-10 NOTE — Progress Notes (Signed)
Pre visit review using our clinic review tool, if applicable. No additional management support is needed unless otherwise documented below in the visit note. 

## 2016-04-11 ENCOUNTER — Ambulatory Visit
Admission: RE | Admit: 2016-04-11 | Discharge: 2016-04-11 | Disposition: A | Discharge status: Home / Self Care | Payer: 59 | Source: Ambulatory Visit | Attending: Internal Medicine | Admitting: Internal Medicine

## 2016-04-11 ENCOUNTER — Encounter: Payer: Self-pay | Admitting: Internal Medicine

## 2016-04-11 DIAGNOSIS — K5732 Diverticulitis of large intestine without perforation or abscess without bleeding: Secondary | ICD-10-CM

## 2016-04-11 DIAGNOSIS — Z9049 Acquired absence of other specified parts of digestive tract: Secondary | ICD-10-CM | POA: Insufficient documentation

## 2016-04-11 DIAGNOSIS — R109 Unspecified abdominal pain: Secondary | ICD-10-CM | POA: Diagnosis not present

## 2016-04-11 LAB — COMPREHENSIVE METABOLIC PANEL
ALK PHOS: 54 U/L (ref 39–117)
ALT: 54 U/L — ABNORMAL HIGH (ref 0–35)
AST: 27 U/L (ref 0–37)
Albumin: 4.4 g/dL (ref 3.5–5.2)
BUN: 6 mg/dL (ref 6–23)
CO2: 27 mEq/L (ref 19–32)
CREATININE: 0.74 mg/dL (ref 0.40–1.20)
Calcium: 9.8 mg/dL (ref 8.4–10.5)
Chloride: 103 mEq/L (ref 96–112)
GFR: 86.56 mL/min (ref >60.00)
GLUCOSE: 91 mg/dL (ref 70–99)
POTASSIUM: 3.6 meq/L (ref 3.5–5.1)
SODIUM: 139 meq/L (ref 135–145)
TOTAL PROTEIN: 7.5 g/dL (ref 6.0–8.3)
Total Bilirubin: 0.5 mg/dL (ref 0.2–1.2)

## 2016-04-11 LAB — CBC WITH DIFFERENTIAL/PLATELET
BASOS PCT: 0.8 % (ref 0.0–3.0)
Basophils Absolute: 0.1 10*3/uL (ref 0.0–0.1)
EOS ABS: 0.1 10*3/uL (ref 0.0–0.7)
EOS PCT: 1.1 % (ref 0.0–5.0)
HCT: 42.9 % (ref 36.0–46.0)
Hemoglobin: 14.7 g/dL (ref 12.0–15.0)
LYMPHS ABS: 2.8 10*3/uL (ref 0.7–4.0)
Lymphocytes Relative: 33.1 % (ref 12.0–46.0)
MCHC: 34.2 g/dL (ref 30.0–36.0)
MCV: 88.6 fl (ref 78.0–100.0)
MONO ABS: 0.6 10*3/uL (ref 0.1–1.0)
Monocytes Relative: 6.8 % (ref 3.0–12.0)
NEUTROS PCT: 58.2 % (ref 43.0–77.0)
Neutro Abs: 4.9 10*3/uL (ref 1.4–7.7)
Platelets: 236 10*3/uL (ref 150.0–400.0)
RBC: 4.84 Mil/uL (ref 3.87–5.11)
RDW: 12.5 % (ref 11.5–15.5)
WBC: 8.3 10*3/uL (ref 4.0–10.5)

## 2016-04-11 LAB — C-REACTIVE PROTEIN: CRP: 0.7 mg/dL (ref 0.5–20.0)

## 2016-04-11 LAB — SEDIMENTATION RATE: Sed Rate: 24 mm/hr (ref 0–30)

## 2016-04-11 NOTE — Assessment & Plan Note (Addendum)
Diagnosed on Dec 3 by CT with descending and sigmoid colonic all thickening with inflammatory changes, leukocytosis .  She is on Day 9 of 14.  Repeat CBC , ESR abd CRP are notmal.   Lab Results  Component Value Date   WBC 8.3 04/10/2016   HGB 14.7 04/10/2016   HCT 42.9 04/10/2016   MCV 88.6 04/10/2016   PLT 236.0 04/10/2016   Lab Results  Component Value Date   ESRSEDRATE 24 04/10/2016   Lab Results  Component Value Date   CRP 0.7 04/10/2016   .

## 2016-04-11 NOTE — Assessment & Plan Note (Addendum)
Persistent, Likely secondary to fatty liver as suggested by CT.  Brief discussion with patient today.  Lab Results  Component Value Date   ALT 54 (H) 04/10/2016   AST 27 04/10/2016   ALKPHOS 54 04/10/2016   BILITOT 0.5 04/10/2016

## 2016-04-12 ENCOUNTER — Encounter: Payer: Self-pay | Admitting: Internal Medicine

## 2016-06-07 ENCOUNTER — Ambulatory Visit: Payer: Self-pay | Admitting: Physician Assistant

## 2016-06-07 ENCOUNTER — Encounter: Payer: Self-pay | Admitting: Physician Assistant

## 2016-06-07 VITALS — BP 110/70 | HR 69 | Temp 98.5°F

## 2016-06-07 DIAGNOSIS — R1032 Left lower quadrant pain: Secondary | ICD-10-CM

## 2016-06-07 MED ORDER — CIPROFLOXACIN HCL 500 MG PO TABS: 500.0000 mg | ORAL_TABLET | Freq: Two times a day (BID) | ORAL | 0 refills | Status: DC

## 2016-06-07 MED ORDER — METRONIDAZOLE 500 MG PO TABS: 500.0000 mg | ORAL_TABLET | Freq: Three times a day (TID) | ORAL | 0 refills | Status: DC

## 2016-06-07 NOTE — Progress Notes (Signed)
S: c/o llq pain, states she was diagnosed in Dec with diverticulitis by CT scan, sx were similar to how she feels now, also went and jumped on the trampoline a lot this weekend, eats nuts and seeds, hasn't been watching her diet, did clear liquids this week and has felt a little better, no fever/chills  O: vitals wnl, nad, skin intact, abd soft tender in llq from umbilicus to hip bone on left side, bs normal all 4 quads, n/v intact  A: llq pain  P: concerns of diverticulitis, pt is traveling this weekend, rx for cipro 500mg  bid, flagyl 500mg  tid, if sx not better in few days would recommend she see GI

## 2016-06-30 ENCOUNTER — Encounter: Payer: Self-pay | Admitting: Internal Medicine

## 2016-06-30 DIAGNOSIS — E041 Nontoxic single thyroid nodule: Secondary | ICD-10-CM

## 2016-06-30 DIAGNOSIS — R740 Nonspecific elevation of levels of transaminase and lactic acid dehydrogenase [LDH]: Secondary | ICD-10-CM

## 2016-06-30 DIAGNOSIS — R7401 Elevation of levels of liver transaminase levels: Secondary | ICD-10-CM

## 2016-06-30 DIAGNOSIS — E7800 Pure hypercholesterolemia, unspecified: Secondary | ICD-10-CM

## 2016-08-04 ENCOUNTER — Other Ambulatory Visit (INDEPENDENT_AMBULATORY_CARE_PROVIDER_SITE_OTHER): Payer: 59

## 2016-08-04 DIAGNOSIS — E041 Nontoxic single thyroid nodule: Secondary | ICD-10-CM | POA: Diagnosis not present

## 2016-08-04 DIAGNOSIS — E7800 Pure hypercholesterolemia, unspecified: Secondary | ICD-10-CM | POA: Diagnosis not present

## 2016-08-04 DIAGNOSIS — R740 Nonspecific elevation of levels of transaminase and lactic acid dehydrogenase [LDH]: Secondary | ICD-10-CM

## 2016-08-04 DIAGNOSIS — R7401 Elevation of levels of liver transaminase levels: Secondary | ICD-10-CM

## 2016-08-04 LAB — COMPREHENSIVE METABOLIC PANEL
ALK PHOS: 56 U/L (ref 33–130)
ALT: 34 U/L — ABNORMAL HIGH (ref 6–29)
AST: 20 U/L (ref 10–35)
Albumin: 4.4 g/dL (ref 3.6–5.1)
BILIRUBIN TOTAL: 0.6 mg/dL (ref 0.2–1.2)
BUN: 12 mg/dL (ref 7–25)
CO2: 24 mmol/L (ref 20–31)
CREATININE: 0.72 mg/dL (ref 0.50–1.05)
Calcium: 9.4 mg/dL (ref 8.6–10.4)
Chloride: 103 mmol/L (ref 98–110)
GLUCOSE: 90 mg/dL (ref 65–99)
Potassium: 3.9 mmol/L (ref 3.5–5.3)
SODIUM: 136 mmol/L (ref 135–146)
Total Protein: 7.1 g/dL (ref 6.1–8.1)

## 2016-08-04 LAB — LIPID PANEL
CHOLESTEROL: 216 mg/dL — AB (ref <200)
HDL: 48 mg/dL — ABNORMAL LOW (ref >50)
LDL CALC: 129 mg/dL — AB (ref <100)
Total CHOL/HDL Ratio: 4.5 Ratio (ref <5.0)
Triglycerides: 194 mg/dL — ABNORMAL HIGH (ref <150)
VLDL: 39 mg/dL — AB (ref <30)

## 2016-08-04 LAB — TSH: TSH: 1.02 mIU/L

## 2016-08-04 NOTE — Addendum Note (Signed)
Addended by: Arby Barrette on: 08/04/2016 11:51 AM   Modules accepted: Orders

## 2016-08-06 ENCOUNTER — Encounter: Payer: Self-pay | Admitting: Internal Medicine

## 2016-08-09 ENCOUNTER — Encounter: Payer: Self-pay | Admitting: Internal Medicine

## 2016-08-09 ENCOUNTER — Ambulatory Visit (INDEPENDENT_AMBULATORY_CARE_PROVIDER_SITE_OTHER): Payer: 59 | Admitting: Internal Medicine

## 2016-08-09 VITALS — BP 110/66 | HR 84 | Temp 98.5°F | Resp 15 | Ht 64.0 in | Wt 171.2 lb

## 2016-08-09 DIAGNOSIS — Z1231 Encounter for screening mammogram for malignant neoplasm of breast: Secondary | ICD-10-CM

## 2016-08-09 DIAGNOSIS — R740 Nonspecific elevation of levels of transaminase and lactic acid dehydrogenase [LDH]: Secondary | ICD-10-CM

## 2016-08-09 DIAGNOSIS — Z0000 Encounter for general adult medical examination without abnormal findings: Secondary | ICD-10-CM

## 2016-08-09 DIAGNOSIS — K760 Fatty (change of) liver, not elsewhere classified: Secondary | ICD-10-CM | POA: Diagnosis not present

## 2016-08-09 DIAGNOSIS — R7401 Elevation of levels of liver transaminase levels: Secondary | ICD-10-CM

## 2016-08-09 DIAGNOSIS — Z1239 Encounter for other screening for malignant neoplasm of breast: Secondary | ICD-10-CM

## 2016-08-09 NOTE — Progress Notes (Signed)
Pre visit review using our clinic review tool, if applicable. No additional management support is needed unless otherwise documented below in the visit note. 

## 2016-08-09 NOTE — Patient Instructions (Signed)
Health Maintenance for Postmenopausal Women Menopause is a normal process in which your reproductive ability comes to an end. This process happens gradually over a span of months to years, usually between the ages of 33 and 38. Menopause is complete when you have missed 12 consecutive menstrual periods. It is important to talk with your health care provider about some of the most common conditions that affect postmenopausal women, such as heart disease, cancer, and bone loss (osteoporosis). Adopting a healthy lifestyle and getting preventive care can help to promote your health and wellness. Those actions can also lower your chances of developing some of these common conditions. What should I know about menopause? During menopause, you may experience a number of symptoms, such as:  Moderate-to-severe hot flashes.  Night sweats.  Decrease in sex drive.  Mood swings.  Headaches.  Tiredness.  Irritability.  Memory problems.  Insomnia. Choosing to treat or not to treat menopausal changes is an individual decision that you make with your health care provider. What should I know about hormone replacement therapy and supplements? Hormone therapy products are effective for treating symptoms that are associated with menopause, such as hot flashes and night sweats. Hormone replacement carries certain risks, especially as you become older. If you are thinking about using estrogen or estrogen with progestin treatments, discuss the benefits and risks with your health care provider. What should I know about heart disease and stroke? Heart disease, heart attack, and stroke become more likely as you age. This may be due, in part, to the hormonal changes that your body experiences during menopause. These can affect how your body processes dietary fats, triglycerides, and cholesterol. Heart attack and stroke are both medical emergencies. There are many things that you can do to help prevent heart disease  and stroke:  Have your blood pressure checked at least every 1-2 years. High blood pressure causes heart disease and increases the risk of stroke.  If you are 48-61 years old, ask your health care provider if you should take aspirin to prevent a heart attack or a stroke.  Do not use any tobacco products, including cigarettes, chewing tobacco, or electronic cigarettes. If you need help quitting, ask your health care provider.  It is important to eat a healthy diet and maintain a healthy weight.  Be sure to include plenty of vegetables, fruits, low-fat dairy products, and lean protein.  Avoid eating foods that are high in solid fats, added sugars, or salt (sodium).  Get regular exercise. This is one of the most important things that you can do for your health.  Try to exercise for at least 150 minutes each week. The type of exercise that you do should increase your heart rate and make you sweat. This is known as moderate-intensity exercise.  Try to do strengthening exercises at least twice each week. Do these in addition to the moderate-intensity exercise.  Know your numbers.Ask your health care provider to check your cholesterol and your blood glucose. Continue to have your blood tested as directed by your health care provider. What should I know about cancer screening? There are several types of cancer. Take the following steps to reduce your risk and to catch any cancer development as early as possible. Breast Cancer  Practice breast self-awareness.  This means understanding how your breasts normally appear and feel.  It also means doing regular breast self-exams. Let your health care provider know about any changes, no matter how small.  If you are 40 or older,  have a clinician do a breast exam (clinical breast exam or CBE) every year. Depending on your age, family history, and medical history, it may be recommended that you also have a yearly breast X-ray (mammogram).  If you  have a family history of breast cancer, talk with your health care provider about genetic screening.  If you are at high risk for breast cancer, talk with your health care provider about having an MRI and a mammogram every year.  Breast cancer (BRCA) gene test is recommended for women who have family members with BRCA-related cancers. Results of the assessment will determine the need for genetic counseling and BRCA1 and for BRCA2 testing. BRCA-related cancers include these types:  Breast. This occurs in males or females.  Ovarian.  Tubal. This may also be called fallopian tube cancer.  Cancer of the abdominal or pelvic lining (peritoneal cancer).  Prostate.  Pancreatic. Cervical, Uterine, and Ovarian Cancer  Your health care provider may recommend that you be screened regularly for cancer of the pelvic organs. These include your ovaries, uterus, and vagina. This screening involves a pelvic exam, which includes checking for microscopic changes to the surface of your cervix (Pap test).  For women ages 21-65, health care providers may recommend a pelvic exam and a Pap test every three years. For women ages 23-65, they may recommend the Pap test and pelvic exam, combined with testing for human papilloma virus (HPV), every five years. Some types of HPV increase your risk of cervical cancer. Testing for HPV may also be done on women of any age who have unclear Pap test results.  Other health care providers may not recommend any screening for nonpregnant women who are considered low risk for pelvic cancer and have no symptoms. Ask your health care provider if a screening pelvic exam is right for you.  If you have had past treatment for cervical cancer or a condition that could lead to cancer, you need Pap tests and screening for cancer for at least 20 years after your treatment. If Pap tests have been discontinued for you, your risk factors (such as having a new sexual partner) need to be reassessed  to determine if you should start having screenings again. Some women have medical problems that increase the chance of getting cervical cancer. In these cases, your health care provider may recommend that you have screening and Pap tests more often.  If you have a family history of uterine cancer or ovarian cancer, talk with your health care provider about genetic screening.  If you have vaginal bleeding after reaching menopause, tell your health care provider.  There are currently no reliable tests available to screen for ovarian cancer. Lung Cancer  Lung cancer screening is recommended for adults 99-83 years old who are at high risk for lung cancer because of a history of smoking. A yearly low-dose CT scan of the lungs is recommended if you:  Currently smoke.  Have a history of at least 30 pack-years of smoking and you currently smoke or have quit within the past 15 years. A pack-year is smoking an average of one pack of cigarettes per day for one year. Yearly screening should:  Continue until it has been 15 years since you quit.  Stop if you develop a health problem that would prevent you from having lung cancer treatment. Colorectal Cancer  This type of cancer can be detected and can often be prevented.  Routine colorectal cancer screening usually begins at age 72 and continues  through age 75.  If you have risk factors for colon cancer, your health care provider may recommend that you be screened at an earlier age.  If you have a family history of colorectal cancer, talk with your health care provider about genetic screening.  Your health care provider may also recommend using home test kits to check for hidden blood in your stool.  A small camera at the end of a tube can be used to examine your colon directly (sigmoidoscopy or colonoscopy). This is done to check for the earliest forms of colorectal cancer.  Direct examination of the colon should be repeated every 5-10 years until  age 75. However, if early forms of precancerous polyps or small growths are found or if you have a family history or genetic risk for colorectal cancer, you may need to be screened more often. Skin Cancer  Check your skin from head to toe regularly.  Monitor any moles. Be sure to tell your health care provider:  About any new moles or changes in moles, especially if there is a change in a mole's shape or color.  If you have a mole that is larger than the size of a pencil eraser.  If any of your family members has a history of skin cancer, especially at a young age, talk with your health care provider about genetic screening.  Always use sunscreen. Apply sunscreen liberally and repeatedly throughout the day.  Whenever you are outside, protect yourself by wearing long sleeves, pants, a wide-brimmed hat, and sunglasses. What should I know about osteoporosis? Osteoporosis is a condition in which bone destruction happens more quickly than new bone creation. After menopause, you may be at an increased risk for osteoporosis. To help prevent osteoporosis or the bone fractures that can happen because of osteoporosis, the following is recommended:  If you are 19-50 years old, get at least 1,000 mg of calcium and at least 600 mg of vitamin D per day.  If you are older than age 50 but younger than age 70, get at least 1,200 mg of calcium and at least 600 mg of vitamin D per day.  If you are older than age 70, get at least 1,200 mg of calcium and at least 800 mg of vitamin D per day. Smoking and excessive alcohol intake increase the risk of osteoporosis. Eat foods that are rich in calcium and vitamin D, and do weight-bearing exercises several times each week as directed by your health care provider. What should I know about how menopause affects my mental health? Depression may occur at any age, but it is more common as you become older. Common symptoms of depression include:  Low or sad  mood.  Changes in sleep patterns.  Changes in appetite or eating patterns.  Feeling an overall lack of motivation or enjoyment of activities that you previously enjoyed.  Frequent crying spells. Talk with your health care provider if you think that you are experiencing depression. What should I know about immunizations? It is important that you get and maintain your immunizations. These include:  Tetanus, diphtheria, and pertussis (Tdap) booster vaccine.  Influenza every year before the flu season begins.  Pneumonia vaccine.  Shingles vaccine. Your health care provider may also recommend other immunizations. This information is not intended to replace advice given to you by your health care provider. Make sure you discuss any questions you have with your health care provider. Document Released: 06/09/2005 Document Revised: 11/05/2015 Document Reviewed: 01/19/2015 [employer] Interactive Patient   Education  2017 [employer]  

## 2016-08-09 NOTE — Progress Notes (Signed)
Patient ID: Pamela Lara, female    DOB: 01-04-1961  Age: 56 y.o. MRN: [id number]  The patient is here for annual non gyn  examination and management of other chronic and acute problems.    Last PAP normal  Dec 2014  Deferred today  Mammogram jan 2016 HISTORY OF DIVERTICULITIS . Reviewed prior Colonoscopy, egd 2013 Iftikhar,  No tics noted  EGD showed H Pylori gastritis   ALT was elevated AT 34 LAST WEEK   DOWN From previous level. E  WALKING DAILY  TAKES SENNAKOT DAILY      The risk factors are reflected in the social history.  The roster of all physicians providing medical care to patient - is listed in the Snapshot section of the chart.  Activities of daily living:  The patient is 100% independent in all ADLs: dressing, toileting, feeding as well as independent mobility  Home safety : The patient has smoke detectors in the home. They wear seatbelts.  There are no firearms at home. There is no violence in the home.   There is no risks for hepatitis, STDs or HIV. There is no   history of blood transfusion. They have no travel history to infectious disease endemic areas of the world.  The patient has seen their dentist in the last six month. They have seen their eye doctor in the last year.   They do not  have excessive sun exposure. Discussed the need for sun protection: hats, long sleeves and use of sunscreen if there is significant sun exposure.   Diet: the importance of a healthy diet is discussed. They do have a healthy diet.  The benefits of regular aerobic exercise were discussed. She walks 4 times per week ,  20 minutes.   Depression screen: there are no signs or vegative symptoms of depression- irritability, change in appetite, anhedonia, sadness/tearfullness.   The following portions of the patient's history were reviewed and updated as appropriate: allergies, current medications, past family history, past medical history,  past surgical history, past social history   and problem list.  Visual acuity was not assessed per patient preference since she has regular follow up with her ophthalmologist. Hearing and body mass index were assessed and reviewed.   During the course of the visit the patient was educated and counseled about appropriate screening and preventive services including : fall prevention , diabetes screening, nutrition counseling, colorectal cancer screening, and recommended immunizations.    CC: The encounter diagnosis was Screening breast examination.  History Nura has a past medical history of GERD (gastroesophageal reflux disease); H. pylori infection; and Seasonal affective disorder ([place]).   She has a past surgical history that includes Tubal ligation (2001); Joint replacement; Cholecystectomy (1984); and Tibia Osteotomy (Bilateral, 1980).   Her family history includes Arthritis in her father and mother; Cancer in her mother; Cancer (age of onset: 35) in her father; Hypertension in her father and mother.She reports that she has been smoking Cigarettes.  She has never used smokeless tobacco. She reports that she drinks alcohol. She reports that she does not use drugs.  Outpatient Medications Prior to Visit  Medication Sig Dispense Refill  . Cholecalciferol (VITAMIN D3) 1000 UNITS CAPS Take one by mouth daily    . Multiple Vitamin (MULTIVITAMIN) tablet Take 1 tablet by mouth daily.      . Omega-3 Fatty Acids (FISH OIL) 1000 MG CAPS Take 2 capsules by mouth daily.    . ciprofloxacin (CIPRO) 500 MG tablet Take 1 tablet (  500 mg total) by mouth 2 (two) times daily. (Patient not taking: Reported on 08/09/2016) 14 tablet 0  . HYDROcodone-acetaminophen (NORCO/VICODIN) 5-325 MG tablet Take 1 tablet by mouth every 4 (four) hours as needed. (Patient not taking: Reported on 08/09/2016) 15 tablet 0  . metroNIDAZOLE (FLAGYL) 500 MG tablet Take 1 tablet (500 mg total) by mouth 3 (three) times daily. (Patient not taking: Reported on 08/09/2016) 21 tablet 0    No facility-administered medications prior to visit.     Review of Systems   Patient denies headache, fevers, malaise, unintentional weight loss, skin rash, eye pain, sinus congestion and sinus pain, sore throat, dysphagia,  hemoptysis , cough, dyspnea, wheezing, chest pain, palpitations, orthopnea, edema, abdominal pain, nausea, melena, diarrhea, constipation, flank pain, dysuria, hematuria, urinary  Frequency, nocturia, numbness, tingling, seizures,  Focal weakness, Loss of consciousness,  Tremor, insomnia, depression, anxiety, and suicidal ideation.      Objective:  BP 110/66   Pulse 84   Temp 98.5 F (36.9 C) (Oral)   Resp 15   Ht 5\' 4"  (1.626 m)   Wt 171 lb 3.2 oz (77.7 kg)   SpO2 96%   BMI 29.39 kg/m   Physical Exam   General appearance: alert, cooperative and appears stated age Head: Normocephalic, without obvious abnormality, atraumatic Eyes: conjunctivae/corneas clear. PERRL, EOM's intact. Fundi benign. Ears: normal TM's and external ear canals both ears Nose: Nares normal. Septum midline. Mucosa normal. No drainage or sinus tenderness. Throat: lips, mucosa, and tongue normal; teeth and gums normal Neck: no adenopathy, no carotid bruit, no JVD, supple, symmetrical, trachea midline and thyroid not enlarged, symmetric, no tenderness/mass/nodules Lungs: clear to auscultation bilaterally Breasts: normal appearance, no masses or tenderness Heart: regular rate and rhythm, S1, S2 normal, no murmur, click, rub or gallop Abdomen: soft, non-tender; bowel sounds normal; no masses,  no organomegaly Extremities: extremities normal, atraumatic, no cyanosis or edema Pulses: 2+ and symmetric Skin: Skin color, texture, turgor normal. No rashes or lesions Neurologic: Alert and oriented X 3, normal strength and tone. Normal symmetric reflexes. Normal coordination and gait.      Assessment & Plan:   Problem List Items Addressed This Visit    None    Visit Diagnoses     Screening breast examination    -  Primary   Relevant Orders   MM SCREENING BREAST TOMO BILATERAL      I have discontinued Ms. Hilger's HYDROcodone-acetaminophen, ciprofloxacin, and metroNIDAZOLE. I am also having her maintain her multivitamin, Vitamin D3, Fish Oil, senna, [place] Ginseng, and glucosamine-chondroitin.  Meds ordered this encounter  Medications  . senna (SENOKOT) 8.6 MG tablet    Sig: Take 1 tablet by mouth daily.  Coralyn Pear Ginseng 1000 MG TABS    Sig: Take 1 tablet by mouth daily.  Marland Kitchen glucosamine-chondroitin 500-400 MG tablet    Sig: Take 1 tablet by mouth 3 (three) times daily.    Medications Discontinued During This Encounter  Medication Reason  . ciprofloxacin (CIPRO) 500 MG tablet Therapy completed  . HYDROcodone-acetaminophen (NORCO/VICODIN) 5-325 MG tablet Therapy completed  . metroNIDAZOLE (FLAGYL) 500 MG tablet Therapy completed    Follow-up: No Follow-up on file.   Crecencio Mc, MD

## 2016-08-12 ENCOUNTER — Encounter: Payer: Self-pay | Admitting: Internal Medicine

## 2016-08-12 DIAGNOSIS — K760 Fatty (change of) liver, not elsewhere classified: Secondary | ICD-10-CM | POA: Insufficient documentation

## 2016-08-12 NOTE — Assessment & Plan Note (Signed)
Presumed by appearance on prior ct and serologies negative for autoimmune causes of hepatitis.  Current liver enzymes are normal and all modifiable risk factors including obesity, diabetes and hyperlipidemia have been addressed .  Will recommended hepatitis a and b vaccines.  Lab Results  Component Value Date   ALT 34 (H) 08/04/2016   AST 20 08/04/2016   ALKPHOS 56 08/04/2016   BILITOT 0.6 08/04/2016

## 2016-08-12 NOTE — Assessment & Plan Note (Signed)
Annual comprehensive preventive exam was done as well as an evaluation and management of chronic conditions .  During the course of the visit the patient was educated and counseled about appropriate screening and preventive services including :  diabetes screening, lipid analysis with projected  10 year  risk for CAD , nutrition counseling, breast, cervical and colorectal cancer screening, and recommended immunizations.  Printed recommendations for health maintenance screenings was given 

## 2016-08-12 NOTE — Assessment & Plan Note (Signed)
Resolved by repeat assessment,  Likely secondary to fatty liver as suggested by CT.   Lab Results  Component Value Date   ALT 34 (H) 08/04/2016   AST 20 08/04/2016   ALKPHOS 56 08/04/2016   BILITOT 0.6 08/04/2016

## 2016-09-27 ENCOUNTER — Ambulatory Visit
Admission: RE | Admit: 2016-09-27 | Discharge: 2016-09-27 | Disposition: A | Discharge status: Home / Self Care | Payer: 59 | Source: Ambulatory Visit | Attending: Internal Medicine | Admitting: Internal Medicine

## 2016-09-27 ENCOUNTER — Other Ambulatory Visit: Payer: Self-pay | Admitting: Internal Medicine

## 2016-09-27 DIAGNOSIS — Z1239 Encounter for other screening for malignant neoplasm of breast: Secondary | ICD-10-CM

## 2016-09-27 DIAGNOSIS — N6310 Unspecified lump in the right breast, unspecified quadrant: Secondary | ICD-10-CM

## 2016-09-27 DIAGNOSIS — Z1231 Encounter for screening mammogram for malignant neoplasm of breast: Secondary | ICD-10-CM | POA: Insufficient documentation

## 2016-09-27 DIAGNOSIS — R928 Other abnormal and inconclusive findings on diagnostic imaging of breast: Secondary | ICD-10-CM

## 2016-09-28 ENCOUNTER — Encounter: Payer: Self-pay | Admitting: Internal Medicine

## 2016-10-11 ENCOUNTER — Ambulatory Visit
Admission: RE | Admit: 2016-10-11 | Discharge: 2016-10-11 | Disposition: A | Discharge status: Home / Self Care | Payer: 59 | Source: Ambulatory Visit | Attending: Internal Medicine | Admitting: Internal Medicine

## 2016-10-11 DIAGNOSIS — N6310 Unspecified lump in the right breast, unspecified quadrant: Secondary | ICD-10-CM | POA: Insufficient documentation

## 2016-10-11 DIAGNOSIS — R928 Other abnormal and inconclusive findings on diagnostic imaging of breast: Secondary | ICD-10-CM

## 2016-10-11 DIAGNOSIS — N6489 Other specified disorders of breast: Secondary | ICD-10-CM | POA: Diagnosis not present

## 2016-11-07 ENCOUNTER — Encounter: Payer: Self-pay | Admitting: Internal Medicine

## 2016-11-08 ENCOUNTER — Other Ambulatory Visit: Payer: Self-pay | Admitting: Internal Medicine

## 2016-11-08 MED ORDER — CIPROFLOXACIN HCL 500 MG PO TABS: 500.0000 mg | ORAL_TABLET | Freq: Two times a day (BID) | ORAL | 0 refills | Status: DC

## 2017-01-02 ENCOUNTER — Encounter: Payer: Self-pay | Admitting: Internal Medicine

## 2017-01-05 ENCOUNTER — Encounter: Payer: Self-pay | Admitting: Internal Medicine

## 2017-01-24 NOTE — Telephone Encounter (Signed)
Error

## 2017-02-25 ENCOUNTER — Encounter: Payer: Self-pay | Admitting: Internal Medicine

## 2017-02-25 DIAGNOSIS — K5792 Diverticulitis of intestine, part unspecified, without perforation or abscess without bleeding: Secondary | ICD-10-CM

## 2017-02-26 ENCOUNTER — Other Ambulatory Visit: Payer: Self-pay | Admitting: Internal Medicine

## 2017-02-26 MED ORDER — METRONIDAZOLE 500 MG PO TABS: 500.0000 mg | ORAL_TABLET | Freq: Three times a day (TID) | ORAL | 0 refills | Status: DC

## 2017-02-26 MED ORDER — PREDNISONE & DIPHENHYDRAMINE 3 X 50 MG & 1 X 50 MG PO KIT: 1.0000 | PACK | ORAL | 2 refills | Status: DC

## 2017-02-26 MED ORDER — CIPROFLOXACIN HCL 500 MG PO TABS: 500.0000 mg | ORAL_TABLET | Freq: Two times a day (BID) | ORAL | 0 refills | Status: DC

## 2017-02-26 NOTE — Telephone Encounter (Signed)
Pt called about having a CT done today (this afternoon)at [facility] since she works at [employer]. She said that she is allergic to the contrast dye so it will need to be done as the "IV contrast protocol" so the rx's can be sent to the pharmacy for her to pick up, if you want this ordered with contrast. Pt cb [phone]

## 2017-02-28 ENCOUNTER — Encounter: Payer: Self-pay | Admitting: Internal Medicine

## 2017-02-28 ENCOUNTER — Ambulatory Visit
Admission: RE | Admit: 2017-02-28 | Discharge: 2017-02-28 | Disposition: A | Discharge status: Home / Self Care | Payer: 59 | Source: Ambulatory Visit | Attending: Internal Medicine | Admitting: Internal Medicine

## 2017-02-28 DIAGNOSIS — R197 Diarrhea, unspecified: Secondary | ICD-10-CM | POA: Diagnosis not present

## 2017-02-28 DIAGNOSIS — Z9049 Acquired absence of other specified parts of digestive tract: Secondary | ICD-10-CM | POA: Insufficient documentation

## 2017-02-28 DIAGNOSIS — N281 Cyst of kidney, acquired: Secondary | ICD-10-CM | POA: Diagnosis not present

## 2017-02-28 DIAGNOSIS — K5792 Diverticulitis of intestine, part unspecified, without perforation or abscess without bleeding: Secondary | ICD-10-CM | POA: Insufficient documentation

## 2017-02-28 DIAGNOSIS — K760 Fatty (change of) liver, not elsewhere classified: Secondary | ICD-10-CM | POA: Diagnosis not present

## 2017-02-28 MED ORDER — IOPAMIDOL (ISOVUE-300) INJECTION 61%
100.0000 mL | Freq: Once | INTRAVENOUS | Status: AC | PRN
  Administered 2017-02-28: 100 mL via INTRAVENOUS

## 2017-03-01 ENCOUNTER — Encounter: Payer: Self-pay | Admitting: Internal Medicine

## 2017-03-01 DIAGNOSIS — K5732 Diverticulitis of large intestine without perforation or abscess without bleeding: Secondary | ICD-10-CM

## 2017-04-12 ENCOUNTER — Ambulatory Visit: Payer: 59 | Admitting: Gastroenterology

## 2017-04-12 ENCOUNTER — Other Ambulatory Visit: Payer: Self-pay

## 2017-04-12 ENCOUNTER — Other Ambulatory Visit
Admission: RE | Admit: 2017-04-12 | Discharge: 2017-04-12 | Disposition: A | Discharge status: Home / Self Care | Payer: 59 | Source: Ambulatory Visit | Attending: Gastroenterology | Admitting: Gastroenterology

## 2017-04-12 ENCOUNTER — Encounter: Payer: Self-pay | Admitting: Gastroenterology

## 2017-04-12 VITALS — BP 126/63 | HR 72 | Ht 64.0 in | Wt 170.0 lb

## 2017-04-12 DIAGNOSIS — R1032 Left lower quadrant pain: Secondary | ICD-10-CM

## 2017-04-12 DIAGNOSIS — Z1211 Encounter for screening for malignant neoplasm of colon: Secondary | ICD-10-CM

## 2017-04-12 DIAGNOSIS — K5732 Diverticulitis of large intestine without perforation or abscess without bleeding: Secondary | ICD-10-CM | POA: Diagnosis not present

## 2017-04-12 DIAGNOSIS — Z8619 Personal history of other infectious and parasitic diseases: Secondary | ICD-10-CM | POA: Diagnosis not present

## 2017-04-12 NOTE — Progress Notes (Signed)
1/21

## 2017-04-12 NOTE — Progress Notes (Signed)
Gastroenterology Consultation  Referring Provider:     Crecencio Mc, MD Primary Care Physician:  Crecencio Mc, MD Primary Gastroenterologist:  Dr. Allen Norris     Reason for Consultation:     Abdominal pain        HPI:   Pamela Lara is a 56 y.o. y/o female referred for consultation & management of abdominal pain by Dr. Derrel Nip, Aris Everts, MD.  This patient comes in today with a history of abdominal pain. The patient had a CT scan of the abdomen ordered by Dr. Derrel Nip. That was done on October 31 of this year and showed the patient to have uncomplicated acute diverticulitis of the colon at the area between the sigmoid colon and descending colon. The patient reports that she's had 4 episodes of diverticulitis within the last year. The patient has had 2 CT scans confirming diverticulitis. The patient was recommended to see a surgeon for possible resection of the area affected. The patient states that her last colonoscopy and EGD was done 8 years ago and showed her to have H. pylori. The patient states that at that time she was not told that she had diverticulosis. She reports that her last attack of diverticulitis was in October and she has been doing well ever since. She does state that when she starts to have the pain she starts herself on a clear liquid diet right away. She has been treated with Cipro and Flagyl in the past but she states that she has a reaction to the Flagyl which she reports it to be making her very agitated.  Past Medical History:  Diagnosis Date  . GERD (gastroesophageal reflux disease)   . H. pylori infection    with colonoscopy  . Seasonal affective disorder [facility])     Past Surgical History:  Procedure Laterality Date  . CHOLECYSTECTOMY  1984   open  . 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 TIBIA OSTEOTOMY Bilateral 1980   done 1 year apart   . TUBAL LIGATION  2001   secondary to ectopic    Prior to Admission  medications   Medication Sig Start Date End Date Taking? Authorizing Provider  Cholecalciferol (VITAMIN D3) 1000 UNITS CAPS Take one by mouth daily    [provider]  ciprofloxacin (CIPRO) 500 MG tablet Take 1 tablet (500 mg total) by mouth 2 (two) times daily. 02/26/17   Crecencio Mc, MD  glucosamine-chondroitin 500-400 MG tablet Take 1 tablet by mouth 3 (three) times daily.    [provider]  [place] Ginseng 1000 MG TABS Take 1 tablet by mouth daily.    [provider]  metroNIDAZOLE (FLAGYL) 500 MG tablet Take 1 tablet (500 mg total) by mouth 3 (three) times daily. 02/26/17   Crecencio Mc, MD  Multiple Vitamin (MULTIVITAMIN) tablet Take 1 tablet by mouth daily.      [provider]  Omega-3 Fatty Acids (FISH OIL) 1000 MG CAPS Take 2 capsules by mouth daily.    [provider]  Prednisone & Diphenhydramine (CONTRAST ALLERGY PREMED PACK) 50 (3) & 50 (1) MG KIT Take 1 tablet by mouth as directed. prednisone  50 mg 13, 7 and 1 hour prior to procedure. dipenhydramine 50 mg 1 hr prior to procedure 02/26/17   Crecencio Mc, MD  senna (SENOKOT) 8.6 MG tablet Take 1 tablet by mouth daily.    [provider]  Family History  Problem Relation Age of Onset  . Cancer Mother   . Arthritis Mother   . Hypertension Mother   . Cancer Father 13       Mesothelioma  . Arthritis Father   . Hypertension Father   . Breast cancer Neg Hx      Social History   Tobacco Use  . Smoking status: Current Every Day Smoker    Types: Cigarettes  . Smokeless tobacco: Never Used  . Tobacco comment:  just 2 cigarettes per day.  Substance Use Topics  . Alcohol use: Yes    Comment: occasional  . Drug use: No    Allergies as of 04/12/2017 - Review Complete 02/28/2017  Allergen Reaction Noted  . Ivp dye [iodinated diagnostic agents] Shortness Of Breath 12/21/2010  . Morphine and related Nausea And Vomiting 12/21/2010  . Wellbutrin [bupropion] Other  (See Comments) 04/24/2014    Review of Systems:    All systems reviewed and negative except where noted in HPI.   Physical Exam:  LMP 10/03/2015 (Approximate)  Patient's last menstrual period was 10/03/2015 (approximate). Psych:  Alert and cooperative. Normal mood and affect. General:   Alert,  Well-developed, well-nourished, pleasant and cooperative in NAD Head:  Normocephalic and atraumatic. Eyes:  Sclera clear, no icterus.   Conjunctiva pink. Ears:  Normal auditory acuity. Nose:  No deformity, discharge, or lesions. Mouth:  No deformity or lesions,oropharynx pink & moist. Neck:  Supple; no masses or thyromegaly. Lungs:  Respirations even and unlabored.  Clear throughout to auscultation.   No wheezes, crackles, or rhonchi. No acute distress. Heart:  Regular rate and rhythm; no murmurs, clicks, rubs, or gallops. Abdomen:  Normal bowel sounds.  No bruits.  Soft, non-tender and non-distended without masses, hepatosplenomegaly or hernias noted.  No guarding or rebound tenderness.  Negative Carnett sign.   Rectal:  Deferred.  Msk:  Symmetrical without gross deformities.  Good, equal movement & strength bilaterally. Pulses:  Normal pulses noted. Extremities:  No clubbing or edema.  No cyanosis. Neurologic:  Alert and oriented x3;  grossly normal neurologically. Skin:  Intact without significant lesions or rashes.  No jaundice. Lymph Nodes:  No significant cervical adenopathy. Psych:  Alert and cooperative. Normal mood and affect.  Imaging Studies: No results found.  Assessment and Plan:   Pamela Lara is a 56 y.o. y/o female who has had recurrent diverticulitis. The patient will be set up for a colonoscopy. The patient denies being checked after her treatment for H. pylori to document eradication. The patient will have her blood sent off for H. pylori antibody since it has been so many years the antibody should have cleared by now. If it is positive then she may need treatment for  her H. pylori versus stool H. pylori antigen confirmation. The patient will also be set up for a colonoscopy and has been offered consultation with surgery for elective resection of the area affected. The patient has been explained the plan and agrees with it.  Lucilla Lame, MD. Marval Regal   Note: This dictation was prepared with Dragon dictation along with smaller phrase technology. Any transcriptional errors that result from this process are unintentional.

## 2017-04-13 LAB — H. PYLORI ANTIBODY, IGG: H Pylori IgG: 0.97 Index Value — ABNORMAL HIGH (ref 0.00–0.79)

## 2017-04-16 ENCOUNTER — Telehealth: Payer: Self-pay | Admitting: Gastroenterology

## 2017-04-16 ENCOUNTER — Encounter: Payer: Self-pay | Admitting: *Deleted

## 2017-04-16 ENCOUNTER — Other Ambulatory Visit: Payer: Self-pay

## 2017-04-16 NOTE — Telephone Encounter (Signed)
Patient left a voice message wanting you to call and answer a couple of questions. 1. She needs an early appt. Her husband needs to be to work in [place] by noon. 2. She needs results from her H. Pylori test.  Please leave a message on her cell. She can't have her phone out at work.  [phone]

## 2017-04-17 NOTE — Telephone Encounter (Signed)
Pt notified of results

## 2017-04-17 NOTE — Telephone Encounter (Signed)
-----   Message from Lucilla Lame, MD sent at 04/17/2017 11:21 AM EST ----- Let the patient know that her blood test was positive for the H. pylori. This antibody should have resolved but since it has since we will confirm it with a stool H. pylori test.

## 2017-04-18 ENCOUNTER — Other Ambulatory Visit: Payer: Self-pay

## 2017-04-18 DIAGNOSIS — Z8619 Personal history of other infectious and parasitic diseases: Secondary | ICD-10-CM

## 2017-04-18 NOTE — Discharge Instructions (Signed)
General Anesthesia, Adult, Care After °These instructions provide you with information about caring for yourself after your procedure. Your health care provider may also give you more specific instructions. Your treatment has been planned according to current medical practices, but problems sometimes occur. Call your health care provider if you have any problems or questions after your procedure. °What can I expect after the procedure? °After the procedure, it is common to have: °· Vomiting. °· A sore throat. °· Mental slowness. ° °It is common to feel: °· Nauseous. °· Cold or shivery. °· Sleepy. °· Tired. °· Sore or achy, even in parts of your body where you did not have surgery. ° °Follow these instructions at home: °For at least 24 hours after the procedure: °· Do not: °? Participate in activities where you could fall or become injured. °? Drive. °? Use heavy machinery. °? Drink alcohol. °? Take sleeping pills or medicines that cause drowsiness. °? Make important decisions or sign legal documents. °? Take care of children on your own. °· Rest. °Eating and drinking °· If you vomit, drink water, juice, or soup when you can drink without vomiting. °· Drink enough fluid to keep your urine clear or pale yellow. °· Make sure you have little or no nausea before eating solid foods. °· Follow the diet recommended by your health care provider. °General instructions °· Have a responsible adult stay with you until you are awake and alert. °· Return to your normal activities as told by your health care provider. Ask your health care provider what activities are safe for you. °· Take over-the-counter and prescription medicines only as told by your health care provider. °· If you smoke, do not smoke without supervision. °· Keep all follow-up visits as told by your health care provider. This is important. °Contact a health care provider if: °· You continue to have nausea or vomiting at home, and medicines are not helpful. °· You  cannot drink fluids or start eating again. °· You cannot urinate after 8-12 hours. °· You develop a skin rash. °· You have fever. °· You have increasing redness at the site of your procedure. °Get help right away if: °· You have difficulty breathing. °· You have chest pain. °· You have unexpected bleeding. °· You feel that you are having a life-threatening or urgent problem. °This information is not intended to replace advice given to you by your health care provider. Make sure you discuss any questions you have with your health care provider. °Document Released: 07/24/2000 Document Revised: 09/20/2015 Document Reviewed: 04/01/2015 °Elsevier Interactive Patient Education © 2018 [employer] ° °

## 2017-04-19 ENCOUNTER — Ambulatory Visit: Payer: 59 | Admitting: Anesthesiology

## 2017-04-19 ENCOUNTER — Ambulatory Visit
Admission: RE | Admit: 2017-04-19 | Discharge: 2017-04-19 | Disposition: A | Discharge status: Home / Self Care | Payer: 59 | Source: Ambulatory Visit | Attending: Gastroenterology | Admitting: Gastroenterology

## 2017-04-19 ENCOUNTER — Encounter
Admission: RE | Disposition: A | Discharge status: Home / Self Care | Payer: Self-pay | Source: Ambulatory Visit | Attending: Gastroenterology

## 2017-04-19 DIAGNOSIS — Z885 Allergy status to narcotic agent status: Secondary | ICD-10-CM | POA: Insufficient documentation

## 2017-04-19 DIAGNOSIS — K5730 Diverticulosis of large intestine without perforation or abscess without bleeding: Secondary | ICD-10-CM | POA: Insufficient documentation

## 2017-04-19 DIAGNOSIS — Z888 Allergy status to other drugs, medicaments and biological substances status: Secondary | ICD-10-CM | POA: Insufficient documentation

## 2017-04-19 DIAGNOSIS — K621 Rectal polyp: Secondary | ICD-10-CM

## 2017-04-19 DIAGNOSIS — Z1211 Encounter for screening for malignant neoplasm of colon: Secondary | ICD-10-CM | POA: Diagnosis not present

## 2017-04-19 DIAGNOSIS — Z91041 Radiographic dye allergy status: Secondary | ICD-10-CM | POA: Insufficient documentation

## 2017-04-19 DIAGNOSIS — K219 Gastro-esophageal reflux disease without esophagitis: Secondary | ICD-10-CM | POA: Diagnosis not present

## 2017-04-19 DIAGNOSIS — D125 Benign neoplasm of sigmoid colon: Secondary | ICD-10-CM | POA: Diagnosis not present

## 2017-04-19 DIAGNOSIS — K648 Other hemorrhoids: Secondary | ICD-10-CM | POA: Diagnosis not present

## 2017-04-19 DIAGNOSIS — K635 Polyp of colon: Secondary | ICD-10-CM | POA: Diagnosis not present

## 2017-04-19 DIAGNOSIS — M19011 Primary osteoarthritis, right shoulder: Secondary | ICD-10-CM | POA: Diagnosis not present

## 2017-04-19 DIAGNOSIS — F17210 Nicotine dependence, cigarettes, uncomplicated: Secondary | ICD-10-CM | POA: Diagnosis not present

## 2017-04-19 DIAGNOSIS — Z9660 Presence of unspecified orthopedic joint implant: Secondary | ICD-10-CM | POA: Diagnosis not present

## 2017-04-19 HISTORY — DX: Nausea with vomiting, unspecified: Z98.890

## 2017-04-19 HISTORY — DX: Unspecified osteoarthritis, unspecified site: M19.90

## 2017-04-19 HISTORY — DX: Benign paroxysmal vertigo, unspecified ear: H81.10

## 2017-04-19 HISTORY — PX: COLONOSCOPY WITH PROPOFOL: SHX5780

## 2017-04-19 HISTORY — DX: Other specified postprocedural states: R11.2

## 2017-04-19 SURGERY — COLONOSCOPY WITH PROPOFOL
Anesthesia: General | Wound class: Clean Contaminated

## 2017-04-19 MED ORDER — LIDOCAINE HCL (CARDIAC) 20 MG/ML IV SOLN
INTRAVENOUS | Status: DC | PRN
  Administered 2017-04-19: 25 mg via INTRAVENOUS

## 2017-04-19 MED ORDER — SODIUM CHLORIDE 0.9 % IV SOLN: INTRAVENOUS | Status: DC

## 2017-04-19 MED ORDER — PROPOFOL 10 MG/ML IV BOLUS
INTRAVENOUS | Status: DC | PRN
  Administered 2017-04-19 (×7): 20 mg via INTRAVENOUS
  Administered 2017-04-19: 100 mg via INTRAVENOUS
  Administered 2017-04-19: 20 mg via INTRAVENOUS

## 2017-04-19 MED ORDER — LACTATED RINGERS IV SOLN
INTRAVENOUS | Status: DC | PRN
  Administered 2017-04-19: 09:00:00 via INTRAVENOUS

## 2017-04-19 SURGICAL SUPPLY — 23 items
CANISTER SUCT 1200ML W/VALVE (MISCELLANEOUS) ×3 IMPLANT
CLIP HMST 235XBRD CATH ROT (MISCELLANEOUS) IMPLANT
CLIP RESOLUTION 360 11X235 (MISCELLANEOUS)
FCP ESCP3.2XJMB 240X2.8X (MISCELLANEOUS)
FORCEPS BIOP RAD 4 LRG CAP 4 (CUTTING FORCEPS) IMPLANT
FORCEPS BIOP RJ4 240 W/NDL (MISCELLANEOUS)
FORCEPS ESCP3.2XJMB 240X2.8X (MISCELLANEOUS) IMPLANT
GOWN CVR UNV OPN BCK APRN NK (MISCELLANEOUS) ×2 IMPLANT
GOWN ISOL THUMB LOOP REG UNIV (MISCELLANEOUS) ×4
INJECTOR VARIJECT VIN23 (MISCELLANEOUS) IMPLANT
KIT DEFENDO VALVE AND CONN (KITS) IMPLANT
KIT ENDO PROCEDURE OLY (KITS) ×3 IMPLANT
MARKER SPOT ENDO TATTOO 5ML (MISCELLANEOUS) IMPLANT
PAD GROUND ADULT SPLIT (MISCELLANEOUS) IMPLANT
PROBE APC STR FIRE (PROBE) IMPLANT
RETRIEVER NET ROTH 2.5X230 LF (MISCELLANEOUS) IMPLANT
SNARE SHORT THROW 13M SML OVAL (MISCELLANEOUS) ×3 IMPLANT
SNARE SHORT THROW 30M LRG OVAL (MISCELLANEOUS) IMPLANT
SNARE SNG USE RND 15MM (INSTRUMENTS) IMPLANT
SPOT EX ENDOSCOPIC TATTOO (MISCELLANEOUS)
TRAP ETRAP POLY (MISCELLANEOUS) ×3 IMPLANT
VARIJECT INJECTOR VIN23 (MISCELLANEOUS)
WATER STERILE IRR 250ML POUR (IV SOLUTION) ×3 IMPLANT

## 2017-04-19 NOTE — Anesthesia Postprocedure Evaluation (Signed)
Anesthesia Post Note  Patient: Pamela Lara  Procedure(s) Performed: COLONOSCOPY WITH PROPOFOL (N/A )  Patient location during evaluation: PACU Anesthesia Type: General Level of consciousness: awake and alert Pain management: pain level controlled Vital Signs Assessment: post-procedure vital signs reviewed and stable Respiratory status: spontaneous breathing, nonlabored ventilation, respiratory function stable and patient connected to nasal cannula oxygen Cardiovascular status: blood pressure returned to baseline and stable Postop Assessment: no apparent nausea or vomiting Anesthetic complications: no    Vick Filter D Tyrice Hewitt

## 2017-04-19 NOTE — Anesthesia Procedure Notes (Signed)
Procedure Name: MAC Performed by: Adisson Deak, CRNA Pre-anesthesia Checklist: Patient identified, Emergency Drugs available, Suction available, Patient being monitored and Timeout performed Patient Re-evaluated:Patient Re-evaluated prior to induction Oxygen Delivery Method: Nasal cannula       

## 2017-04-19 NOTE — Transfer of Care (Signed)
Immediate Anesthesia Transfer of Care Note  Patient: Pamela Lara  Procedure(s) Performed: COLONOSCOPY WITH PROPOFOL (N/A )  Patient Location: PACU  Anesthesia Type: General  Level of Consciousness: awake, alert  and patient cooperative  Airway and Oxygen Therapy: Patient Spontanous Breathing and Patient connected to supplemental oxygen  Post-op Assessment: Post-op Vital signs reviewed, Patient's Cardiovascular Status Stable, Respiratory Function Stable, Patent Airway and No signs of Nausea or vomiting  Post-op Vital Signs: Reviewed and stable  Complications: No apparent anesthesia complications

## 2017-04-19 NOTE — Op Note (Signed)
[facility] Gastroenterology Patient Name: Pamela Lara Procedure Date: 04/19/2017 8:35 AM MRN: [id number] Account #: [id number] Date of Birth: [birth year] Admit Type: Outpatient Age: 56 Room: [facility] OR ROOM 01 Gender: Female Note Status: Finalized Procedure:            Colonoscopy Indications:          Screening for colorectal malignant neoplasm Providers:            Lucilla Lame MD, MD Referring MD:         Deborra Medina, MD (Referring MD) Medicines:            Propofol per Anesthesia Complications:        No immediate complications. Procedure:            Pre-Anesthesia Assessment:                       - Prior to the procedure, a History and Physical was                        performed, and patient medications and allergies were                        reviewed. The patient's tolerance of previous                        anesthesia was also reviewed. The risks and benefits of                        the procedure and the sedation options and risks were                        discussed with the patient. All questions were                        answered, and informed consent was obtained. Prior                        Anticoagulants: The patient has taken no previous                        anticoagulant or antiplatelet agents. ASA Grade                        Assessment: II - A patient with mild systemic disease.                        After reviewing the risks and benefits, the patient was                        deemed in satisfactory condition to undergo the                        procedure.                       After obtaining informed consent, the colonoscope was                        passed under direct vision. Throughout the procedure,  the patient's blood pressure, pulse, and oxygen                        saturations were monitored continuously. The Olympus                        Colonoscope 190 [id number]) was introduced through the                         anus and advanced to the the cecum, identified by                        appendiceal orifice and ileocecal valve. The                        colonoscopy was performed without difficulty. The                        patient tolerated the procedure well. The quality of                        the bowel preparation was excellent. Findings:      The perianal and digital rectal examinations were normal.      A 4 mm polyp was found in the rectum. The polyp was sessile. The polyp       was removed with a cold snare. Resection and retrieval were complete.      Two sessile polyps were found in the sigmoid colon. The polyps were 2 to       3 mm in size. These polyps were removed with a cold snare. Resection and       retrieval were complete.      Multiple small-mouthed diverticula were found in the sigmoid colon.      Non-bleeding internal hemorrhoids were found during retroflexion. The       hemorrhoids were Grade I (internal hemorrhoids that do not prolapse). Impression:           - One 4 mm polyp in the rectum, removed with a cold                        snare. Resected and retrieved.                       - Two 2 to 3 mm polyps in the sigmoid colon, removed                        with a cold snare. Resected and retrieved.                       - Diverticulosis in the sigmoid colon.                       - Non-bleeding internal hemorrhoids. Recommendation:       - Discharge patient to home.                       - Resume previous diet.                       - Continue present medications.                       -  Await pathology results.                       - Repeat colonoscopy in 5 years if polyp adenoma and 10                        years if hyperplastic Procedure Code(s):    --- Professional ---                       [phone], Colonoscopy, flexible; with removal of tumor(s),                        polyp(s), or other lesion(s) by snare technique Diagnosis Code(s):    ---  Professional ---                       Z12.11, Encounter for screening for malignant neoplasm                        of colon                       D12.5, Benign neoplasm of sigmoid colon                       K62.1, Rectal polyp CPT copyright 2016 American Medical Association. All rights reserved. The codes documented in this report are preliminary and upon coder review may  be revised to meet current compliance requirements. Lucilla Lame MD, MD 04/19/2017 9:16:54 AM This report has been signed electronically. Number of Addenda: 0 Note Initiated On: 04/19/2017 8:35 AM Scope Withdrawal Time: 0 hours 11 minutes 18 seconds  Total Procedure Duration: 0 hours 19 minutes 0 seconds       [facility]

## 2017-04-19 NOTE — H&P (Signed)
Pamela Lame, MD [address] [place], [place] [address] [phone] Fax : [phone]  Primary Care Physician:  Crecencio Mc, MD Primary Gastroenterologist:  Dr. Allen Norris  Pre-Procedure History & Physical: HPI:  Pamela Lara is a 56 y.o. female is here for an colonoscopy.   Past Medical History:  Diagnosis Date  . Arthritis    right shoulder  . BPPV (benign paroxysmal positional vertigo)   . GERD (gastroesophageal reflux disease)   . H. pylori infection    with colonoscopy  . PONV (postoperative nausea and vomiting)   . Seasonal affective disorder [facility])     Past Surgical History:  Procedure Laterality Date  . CHOLECYSTECTOMY  1984   open  . 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 TIBIA OSTEOTOMY Bilateral 1980   done 1 year apart   . TUBAL LIGATION  2001   secondary to ectopic    Prior to Admission medications   Medication Sig Start Date End Date Taking? Authorizing Provider  Cholecalciferol (VITAMIN D3) 1000 UNITS CAPS Take one by mouth daily   Yes [provider]  FIBER PO Take by mouth daily.   Yes [provider]  [place] Ginseng 1000 MG TABS Take 1 tablet by mouth daily.   Yes [provider]  Omega-3 Fatty Acids (FISH OIL) 1000 MG CAPS Take 2 capsules by mouth daily.   Yes [provider]    Allergies as of 04/12/2017 - Review Complete 04/12/2017  Allergen Reaction Noted  . Ivp dye [iodinated diagnostic agents] Shortness Of Breath 12/21/2010  . Morphine    . Morphine and related Nausea And Vomiting 12/21/2010  . Wellbutrin [bupropion] Other (See Comments) 04/24/2014    Family History  Problem Relation Age of Onset  . Cancer Mother   . Arthritis Mother   . Hypertension Mother   . Cancer Father 33       Mesothelioma  . Arthritis Father   . Hypertension Father   . Breast cancer Neg Hx     Social History   Socioeconomic History  . Marital  status: Married    Spouse name: Not on file  . Number of children: Not on file  . Years of education: Not on file  . Highest education level: Not on file  Social Needs  . Financial resource strain: Not on file  . Food insecurity - worry: Not on file  . Food insecurity - inability: Not on file  . Transportation needs - medical: Not on file  . Transportation needs - non-medical: Not on file  Occupational History  . Not on file  Tobacco Use  . Smoking status: Current Every Day Smoker    Packs/day: 0.25    Years: 38.00    Pack years: 9.50    Types: Cigarettes  . Smokeless tobacco: Never Used  . Tobacco comment: since age 41  Substance and Sexual Activity  . Alcohol use: Yes    Alcohol/week: 3.0 oz    Types: 5 Cans of beer per week    Comment:    . Drug use: No  . Sexual activity: Not on file  Other Topics Concern  . Not on file  Social History Narrative  . Not on file    Review of Systems: See HPI, otherwise negative ROS  Physical Exam: BP 113/61   Pulse 68   Temp 98.2 F (36.8 C) (Temporal)   Ht 5\' 4"  (  1.626 m)   Wt 165 lb (74.8 kg)   LMP 10/03/2015 (Approximate)   SpO2 98%   BMI 28.32 kg/m  General:   Alert,  pleasant and cooperative in NAD Head:  Normocephalic and atraumatic. Neck:  Supple; no masses or thyromegaly. Lungs:  Clear throughout to auscultation.    Heart:  Regular rate and rhythm. Abdomen:  Soft, nontender and nondistended. Normal bowel sounds, without guarding, and without rebound.   Neurologic:  Alert and  oriented x4;  grossly normal neurologically.  Impression/Plan: Pamela Lara is here for an colonoscopy to be performed for diverticulitis  Risks, benefits, limitations, and alternatives regarding  colonoscopy have been reviewed with the patient.  Questions have been answered.  All parties agreeable.   Pamela Lame, MD  04/19/2017, 7:55 AM

## 2017-04-19 NOTE — Anesthesia Preprocedure Evaluation (Signed)
Anesthesia Evaluation  Patient identified by MRN, date of birth, ID band Patient awake    Reviewed: Allergy & Precautions, H&P , NPO status , Patient's Chart, lab work & pertinent test results, reviewed documented beta blocker date and time   History of Anesthesia Complications (+) PONV and history of anesthetic complications  Airway Mallampati: II  TM Distance: >3 FB Neck ROM: full    Dental no notable dental hx.    Pulmonary Current Smoker,    Pulmonary exam normal breath sounds clear to auscultation       Cardiovascular Exercise Tolerance: Good negative cardio ROS   Rhythm:regular Rate:Normal     Neuro/Psych negative neurological ROS  negative psych ROS   GI/Hepatic Neg liver ROS, GERD  ,  Endo/Other  negative endocrine ROS  Renal/GU negative Renal ROS  negative genitourinary   Musculoskeletal   Abdominal   Peds  Hematology negative hematology ROS (+)   Anesthesia Other Findings   Reproductive/Obstetrics negative OB ROS                             Anesthesia Physical Anesthesia Plan  ASA: II  Anesthesia Plan: General   Post-op Pain Management:    Induction:   PONV Risk Score and Plan: 3 and Ondansetron, Dexamethasone and Scopolamine patch - Pre-op  Airway Management Planned:   Additional Equipment:   Intra-op Plan:   Post-operative Plan:   Informed Consent: I have reviewed the patients History and Physical, chart, labs and discussed the procedure including the risks, benefits and alternatives for the proposed anesthesia with the patient or authorized representative who has indicated his/her understanding and acceptance.     Plan Discussed with: CRNA  Anesthesia Plan Comments:         Anesthesia Quick Evaluation

## 2017-04-20 ENCOUNTER — Encounter: Payer: Self-pay | Admitting: Gastroenterology

## 2017-04-23 ENCOUNTER — Other Ambulatory Visit
Admission: RE | Admit: 2017-04-23 | Discharge: 2017-04-23 | Disposition: A | Discharge status: Home / Self Care | Payer: 59 | Source: Ambulatory Visit | Attending: Gastroenterology | Admitting: Gastroenterology

## 2017-04-23 DIAGNOSIS — Z8619 Personal history of other infectious and parasitic diseases: Secondary | ICD-10-CM | POA: Insufficient documentation

## 2017-04-25 LAB — H. PYLORI ANTIGEN, STOOL: H. Pylori Stool Ag, Eia: NEGATIVE

## 2017-04-26 ENCOUNTER — Telehealth: Payer: Self-pay

## 2017-04-26 NOTE — Telephone Encounter (Signed)
That would be fine 

## 2017-04-26 NOTE — Telephone Encounter (Signed)
Pt notified of lab results.  Pt also requesting a rx for a possible diverticulitis flare. She is not currently having symptoms but wants it just in case it flares over the weekend. Please advise.   She does not want Flagyl.

## 2017-04-26 NOTE — Telephone Encounter (Signed)
-----   Message from Lucilla Lame, MD sent at 04/25/2017 12:26 PM EST ----- Let the patient know that her H. Pylori was negative.

## 2017-04-27 ENCOUNTER — Other Ambulatory Visit: Payer: Self-pay

## 2017-04-27 MED ORDER — CIPROFLOXACIN HCL 500 MG PO TABS: 500.0000 mg | ORAL_TABLET | Freq: Two times a day (BID) | ORAL | 0 refills | Status: DC

## 2017-04-27 NOTE — Telephone Encounter (Signed)
Rx faxed and pt notified

## 2017-05-17 ENCOUNTER — Encounter: Payer: Self-pay | Admitting: Gastroenterology

## 2017-05-18 ENCOUNTER — Encounter: Payer: Self-pay | Admitting: Gastroenterology

## 2017-05-18 ENCOUNTER — Telehealth: Payer: Self-pay | Admitting: Gastroenterology

## 2017-05-18 NOTE — Telephone Encounter (Signed)
Patient is calling for results, please call her at her work number.

## 2017-05-18 NOTE — Telephone Encounter (Signed)
Returned pt's call and advised her of the pathology result and letter. Result letter was sent to pt via MyChart.

## 2017-05-24 ENCOUNTER — Telehealth: Payer: Self-pay | Admitting: Internal Medicine

## 2017-05-24 DIAGNOSIS — N6310 Unspecified lump in the right breast, unspecified quadrant: Secondary | ICD-10-CM

## 2017-05-24 NOTE — Telephone Encounter (Signed)
Please advise 

## 2017-05-24 NOTE — Telephone Encounter (Signed)
Copied from [place] [id number]. Topic: Quick Communication - See Telephone Encounter >> May 24, 2017 11:53 AM Boyd Kerbs wrote: CRM for notification. See Telephone encounter for:   Patient calling for a order for Norval imaging for 6 month breast follow up . Was told to come back in 6 months  05/24/17.

## 2017-05-24 NOTE — Telephone Encounter (Signed)
Diagnostic mammogram and ultrasoudn of right breast ordered

## 2017-05-29 ENCOUNTER — Other Ambulatory Visit: Payer: Self-pay | Admitting: Internal Medicine

## 2017-05-29 DIAGNOSIS — N6310 Unspecified lump in the right breast, unspecified quadrant: Secondary | ICD-10-CM

## 2017-05-29 NOTE — Telephone Encounter (Signed)
LMTCB. Need to let pt know that the diagnostic mammogram and ultrasound of right breast have been ordered. PEC may speak with pt.

## 2017-05-31 NOTE — Telephone Encounter (Signed)
Patient has app scheduled already for exam no need to call

## 2017-06-01 ENCOUNTER — Ambulatory Visit: Payer: Self-pay | Admitting: Surgery

## 2017-06-01 DIAGNOSIS — K5732 Diverticulitis of large intestine without perforation or abscess without bleeding: Secondary | ICD-10-CM | POA: Diagnosis not present

## 2017-06-01 NOTE — H&P (Signed)
CC: Recurrent attacks of diverticulitis; referred by Drs. Wohl and [place]  HPI: Pamela Lara is a very pleasant 57yoF with hx of arthritis who presents today for further evaluation regarding recurrent attacks of diverticulitis.  She first experienced sharp, crampy left lower quadrant abdominal pain in December of 2017.she presented to the emergency room for further evaluation and was found to have left lower quadrant pain, WBC 14 and underwent CT scan which showed findings compatible with acute diverticulitis of the distal descending colon/proximal sigmoid colon.  There is no evidence of abscess or frank perforation.  She was subsequently discharged with antibiotics and improved.  Since that attack she had 3 subsequent attacks-to manage as an outpatient with sharp left lower quadrant pain, nausea and subsequent resolution after oral antibiotics.  She had an attack 02/28/17 which brought her to the emergency roomand she underwent CT abdomen and pelvis which showed acute uncomplicated diverticulitis at the junction of the descending/sigmoid colon. She was again treated with antibiotics with subsequent resolution. 2 months later she had a recurrent attack manifesteds as an outpatient with antibiotics.  She states the frequency of her attacks occur about every 2 months and are interfering with her quality of life.  Today, she denies any significant abdominal pain, fevers/chills/nausea/vomiting. Colonoscopy 04/19/17 Dr. Allen Norris - small hyperplastic polyp in the rectum removed; small hyperplastic polyp anterior adenoma removed from the sigmoid colon. diverticulosis of the sigmoid colon. colonoscopy prior to this 03/2012 was only remarkable for hemorrhoids.  PMH: arthritis PSH: open cholecystectomy in 1985; laparoscopic surgery for tubal pregnancy FHx: Denies FHx of malignancy Social: She is employed as an Chief Financial Officer at [employer]: A comprehensive 10 system review of systems was completed with the patient and  pertinent findings as noted above.  Past Surgical History Alean Rinne, [place]; 06/01/2017 2:17 PM) Gallbladder Surgery - Open   Sentinel Lymph Node Biopsy   Tonsillectomy    Diagnostic Studies History Alean Rinne, [place]; 06/01/2017 2:17 PM) Colonoscopy   within last year Mammogram   within last year Pap Smear   1-5 years ago  Allergies Alean Rinne, RMA; 06/01/2017 2:27 PM) Morphine Derivatives   Iodinated Contrast Media   Wellbutrin *ANTIDEPRESSANTS*   Allergies Reconciled    Medication History Alean Rinne, RMA; 06/01/2017 2:29 PM) Ginseng  (1000MG  Capsule, Oral) Active. Vitamin D3  (1000UNIT Capsule, Oral) Active. Omega 3  (1000MG  Capsule, Oral) Active. Fiber Diet  (Oral) Active. Medications Reconciled   Social History Alean Rinne, [place]; 06/01/2017 2:18 PM) Alcohol use   Occasional alcohol use. Caffeine use   Carbonated beverages, Coffee, Tea. No drug use   Tobacco use   Current every day smoker.  Family History Alean Rinne, [place]; 06/01/2017 2:17 PM) Alcohol Abuse   Brother. Arthritis   Mother. Cancer   Brother, Father, Mother. Colon Polyps   Brother, Sister. Hypertension   Mother.  Pregnancy / Birth History Alean Rinne, [place]; 06/01/2017 2:18 PM) Age at menarche   64 years. Age of menopause   [age] Contraceptive History   Intrauterine device, Oral contraceptives. Gravida   3 Irregular periods   Maternal age   25-25 Para   81  Other Problems Alean Rinne, [place]; 06/01/2017 2:17 PM) Cholelithiasis   Diverticulosis   Gastroesophageal Reflux Disease      Review of Systems Harrell Gave M. Trenten Watchman MD; 06/01/2017 3:37 PM) General Not Present- Appetite Loss, Chills, Fatigue, Fever, Night Sweats, Weight Gain and Weight Loss. Skin Not Present- Change in Wart/Mole, Dryness, Hives, Jaundice, New Lesions, Non-Healing Wounds, Rash and  Ulcer. HEENT Not Present- Earache, Hearing Loss, Hoarseness, Nose Bleed, Oral Ulcers, Ringing in the Ears, Seasonal Allergies, Sinus Pain, Sore  Throat, Visual Disturbances, Wears glasses/contact lenses and Yellow Eyes. Respiratory Not Present- Bloody sputum, Chronic Cough, Difficulty Breathing, Snoring and Wheezing. Breast Not Present- Breast Mass, Breast Pain, Nipple Discharge and Skin Changes. Cardiovascular Not Present- Chest Pain, Difficulty Breathing Lying Down, Leg Cramps, Palpitations, Rapid Heart Rate, Shortness of Breath and Swelling of Extremities. Gastrointestinal Present- Abdominal Pain. Not Present- Nausea and Vomiting. Female Genitourinary Not Present- Frequency, Nocturia, Painful Urination, Pelvic Pain and Urgency. Musculoskeletal Not Present- Back Pain, Joint Pain, Joint Stiffness, Muscle Pain, Muscle Weakness and Swelling of Extremities. Neurological Not Present- Decreased Memory, Fainting, Headaches, Numbness, Seizures, Tingling, Tremor, Trouble walking and Weakness. Psychiatric Not Present- Anxiety, Bipolar, Change in Sleep Pattern, Depression, Fearful and Frequent crying. Endocrine Present- Hot flashes. Not Present- Cold Intolerance, Excessive Hunger, Hair Changes, Heat Intolerance and New Diabetes. Hematology Not Present- Blood Thinners, Easy Bruising, Excessive bleeding, Gland problems, HIV and Persistent Infections.  Vitals Mardene Celeste King RMA; 06/01/2017 2:26 PM) 06/01/2017 2:26 PM Weight: 173 lb   Height: 64 in  Body Surface Area: 1.84 m   Body Mass Index: 29.7 kg/m   Temp.: 98.7 F    Pulse: 96 (Regular)    BP: 120/78 (Sitting, Left Arm, Standard)     Physical Exam Harrell Gave M. Ardean Melroy MD; 06/01/2017 3:38 PM) The physical exam findings are as follows: Note: Constitutional: No acute distress; conversant; no deformities Eyes: Moist conjunctiva; no lid lag; anicteric sclerae; pupils equal round and reactive to light Neck: Trachea midline; no palpable thyromegaly Lungs: Normal respiratory effort; no tactile fremitus CV: Regular rate and rhythm; no palpable thrill; no pitting edema GI: Abdomen soft, nontender,  nondistended; no palpable hepatosplenomegaly; RUQ scar c/w prior open cholecystectomy MSK: Normal gait; no clubbing/cyanosis Psychiatric: Appropriate affect; alert and oriented 3 Lymphatic: No palpable cervical or axillary lymphadenopathy   Assessment & Plan Harrell Gave M. Nikolaj Geraghty MD; 06/01/2017 3:44 PM) DIVERTICULITIS, COLON (K57.32) Impression: Ms. Baptista is a very pleasant 43yoF with hx of arthritis referred for evaluation of multiply recurrent attacks of acute uncomplicated sigmoid diverticulitis - at least 4 episodes in the past 14 months. This is now impacting her quality of life. -The anatomy and physiology of the GI tract was discussed at length with the patient and her husband. The pathophysiology of diverticulitis and indications was discussed at length with associated pictures and illustrations. -I worked to give her an understanding of the preoperative process, operative approaches and technical aspects of the procedure, potential need for stoma, expected postoperative course and recovery. We discussed the possible use of cystoscopy/stents-she has requested this and I agree that they're indicated -The planned procedure, material risks (including, but not limited to, pain, bleeding, infection, scarring, need for blood transfusion, damage to surrounding structures- blood vessels/nerves/viscus/organs, damage to ureter, urine leak, leak from anastomosis, need for additional procedures, need for stoma which may be permanent, hernia, recurrence, pneumonia, heart attack, stroke, death) benefits and alternatives to surgery were discussed at length. I noted a good probability that the procedure would help improve her symptoms. Her and her husband's questions were answered to their satisfaction and they elected to proceed with surgery. -65 minutes were spent in face to face contact with the patient addressing all of the above.  Signed electronically by Ileana Roup, MD (06/01/2017 3:45 PM)

## 2017-06-06 ENCOUNTER — Other Ambulatory Visit: Payer: Self-pay

## 2017-06-11 ENCOUNTER — Ambulatory Visit
Admission: RE | Admit: 2017-06-11 | Discharge: 2017-06-11 | Disposition: A | Discharge status: Home / Self Care | Payer: 59 | Source: Ambulatory Visit | Attending: Internal Medicine | Admitting: Internal Medicine

## 2017-06-11 DIAGNOSIS — N6310 Unspecified lump in the right breast, unspecified quadrant: Secondary | ICD-10-CM | POA: Insufficient documentation

## 2017-06-11 DIAGNOSIS — N6001 Solitary cyst of right breast: Secondary | ICD-10-CM | POA: Diagnosis not present

## 2017-06-12 ENCOUNTER — Other Ambulatory Visit: Payer: Self-pay | Admitting: Urology

## 2017-06-17 ENCOUNTER — Encounter: Payer: Self-pay | Admitting: Internal Medicine

## 2017-06-18 ENCOUNTER — Other Ambulatory Visit: Payer: Self-pay | Admitting: Internal Medicine

## 2017-06-18 MED ORDER — VARENICLINE TARTRATE 1 MG PO TABS: 1.0000 mg | ORAL_TABLET | Freq: Two times a day (BID) | ORAL | 2 refills | Status: DC

## 2017-06-18 MED ORDER — VARENICLINE TARTRATE 0.5 MG X 11 & 1 MG X 42 PO MISC: ORAL | 0 refills | Status: DC

## 2017-06-18 NOTE — Telephone Encounter (Signed)
rx has been printed, signed and faxed.  

## 2017-07-12 DIAGNOSIS — H9313 Tinnitus, bilateral: Secondary | ICD-10-CM | POA: Diagnosis not present

## 2017-07-26 NOTE — Patient Instructions (Addendum)
Pamela Lara  07/26/2017   Your procedure is scheduled on:  04/ 17/ 2019  (8:30am to 12:30am)  Report to [facility]  Entrance  Report to admitting at  6:30AM   Call this number if you have problems the morning of surgery [phone]   Remember: Do not eat food After Midnight with exception clear liquid diet until 5:30AM     CLEAR LIQUID DIET   Foods Allowed                                                                     Foods Excluded  Coffee and tea, regular and decaf                             liquids that you cannot  Plain Jell-O in any flavor                                             see through such as: Fruit ices (not with fruit pulp)                                     milk, soups, orange juice  Iced Popsicles                                    All solid food Carbonated beverages, regular and diet                                    Cranberry, grape and apple juices Sports drinks like Gatorade Lightly seasoned clear broth or consume(fat free) Sugar, honey syrup  Sample Menu Breakfast                                Lunch                                     Supper Cranberry juice                    Beef broth                            Chicken broth Jell-O                                     Grape juice                           Apple juice Coffee or tea  Jell-O                                      Popsicle                                                Coffee or tea                        Coffee or tea  _____________________________________________________________________  DRINK 2 PRESURGERY ENSURE DRINKS THE NIGHT BEFORE SURGERY AT  1000 PM AND 1 PRESURGERY DRINK THE DAY OF THE PROCEDURE 3 HOURS PRIOR TO SCHEDULED SURGERY. NO SOLIDS AFTER MIDNIGHT THE DAY PRIOR TO THE SURGERY. NOTHING BY MOUTH EXCEPT CLEAR LIQUIDS UNTIL THREE HOURS PRIOR TO SCHEDULED SURGERY. PLEASE FINISH PRESURGERY ENSURE DRINK PER SURGEON  ORDER 3 HOURS PRIOR TO SCHEDULED SURGERY TIME WHICH NEEDS TO BE COMPLETED AT ___5:30AM___.    Take these medicines the morning of surgery with A SIP OF WATER:   NONE                                You may not have any metal on your body including hair pins and              piercings  Do not wear jewelry, make-up, lotions, powders or perfumes, deodorant             Do not wear nail polish.  Do not shave  48 hours prior to surgery.                Do not bring valuables to the hospital. [place].  Contacts, dentures or bridgework may not be worn into surgery.  Leave suitcase in the car. After surgery it may be brought to your room.     Special Instructions:  Deep Breathing and Leg Exercises              Please read over the following fact sheets you were given: _____________________________________________________________________    Incentive Spirometer  An incentive spirometer is a tool that can help keep your lungs clear and active. This tool measures how well you are filling your lungs with each breath. Taking long deep breaths may help reverse or decrease the chance of developing breathing (pulmonary) problems (especially infection) following:  A long period of time when you are unable to move or be active. BEFORE THE PROCEDURE   If the spirometer includes an indicator to show your best effort, your nurse or respiratory therapist will set it to a desired goal.  If possible, sit up straight or lean slightly forward. Try not to slouch.  Hold the incentive spirometer in an upright position. INSTRUCTIONS FOR USE  1. Sit on the edge of your bed if possible, or sit up as far as you can in bed or on a chair. 2. Hold the incentive spirometer in an upright position. 3. Breathe out normally. 4. Place the mouthpiece in your mouth and seal your lips tightly around it. 5. Breathe in slowly and as deeply as possible, raising the piston or  the  ball toward the top of the column. 6. Hold your breath for 3-5 seconds or for as long as possible. Allow the piston or ball to fall to the bottom of the column. 7. Remove the mouthpiece from your mouth and breathe out normally. 8. Rest for a few seconds and repeat Steps 1 through 7 at least 10 times every 1-2 hours when you are awake. Take your time and take a few normal breaths between deep breaths. 9. The spirometer may include an indicator to show your best effort. Use the indicator as a goal to work toward during each repetition. 10. After each set of 10 deep breaths, practice coughing to be sure your lungs are clear. If you have an incision (the cut made at the time of surgery), support your incision when coughing by placing a pillow or rolled up towels firmly against it. Once you are able to get out of bed, walk around indoors and cough well. You may stop using the incentive spirometer when instructed by your caregiver.  RISKS AND COMPLICATIONS  Take your time so you do not get dizzy or light-headed.  If you are in pain, you may need to take or ask for pain medication before doing incentive spirometry. It is harder to take a deep breath if you are having pain. AFTER USE  Rest and breathe slowly and easily.  It can be helpful to keep track of a log of your progress. Your caregiver can provide you with a simple table to help with this. If you are using the spirometer at home, follow these instructions: [place] IF:   You are having difficultly using the spirometer.  You have trouble using the spirometer as often as instructed.  Your pain medication is not giving enough relief while using the spirometer.  You develop fever of 100.5 F (38.1 C) or higher. SEEK IMMEDIATE MEDICAL CARE IF:   You cough up bloody sputum that had not been present before.  You develop fever of 102 F (38.9 C) or greater.  You develop worsening pain at or near the incision site. MAKE SURE  YOU:   Understand these instructions.  Will watch your condition.  Will get help right away if you are not doing well or get worse. Document Released: 08/28/2006 Document Revised: 07/10/2011 Document Reviewed: 10/29/2006 ExitCare Patient Information 2014 ExitCare, [place].   ________________________________________________________________________  WHAT IS A BLOOD TRANSFUSION? Blood Transfusion Information  A transfusion is the replacement of blood or some of its parts. Blood is made up of multiple cells which provide different functions.  Red blood cells carry oxygen and are used for blood loss replacement.  White blood cells fight against infection.  Platelets control bleeding.  Plasma helps clot blood.  Other blood products are available for specialized needs, such as hemophilia or other clotting disorders. BEFORE THE TRANSFUSION  Who gives blood for transfusions?   Healthy volunteers who are fully evaluated to make sure their blood is safe. This is blood bank blood. Transfusion therapy is the safest it has ever been in the practice of medicine. Before blood is taken from a donor, a complete history is taken to make sure that person has no history of diseases nor engages in risky social behavior (examples are intravenous drug use or sexual activity with multiple partners). The donor's travel history is screened to minimize risk of transmitting infections, such as malaria. The donated blood is tested for signs of infectious diseases, such as HIV and hepatitis. The blood is  then tested to be sure it is compatible with you in order to minimize the chance of a transfusion reaction. If you or a relative donates blood, this is often done in anticipation of surgery and is not appropriate for emergency situations. It takes many days to process the donated blood. RISKS AND COMPLICATIONS Although transfusion therapy is very safe and saves many lives, the main dangers of transfusion include:    Getting an infectious disease.  Developing a transfusion reaction. This is an allergic reaction to something in the blood you were given. Every precaution is taken to prevent this. The decision to have a blood transfusion has been considered carefully by your caregiver before blood is given. Blood is not given unless the benefits outweigh the risks. AFTER THE TRANSFUSION  Right after receiving a blood transfusion, you will usually feel much better and more energetic. This is especially true if your red blood cells have gotten low (anemic). The transfusion raises the level of the red blood cells which carry oxygen, and this usually causes an energy increase.  The nurse administering the transfusion will monitor you carefully for complications. HOME CARE INSTRUCTIONS  No special instructions are needed after a transfusion. You may find your energy is better. Speak with your caregiver about any limitations on activity for underlying diseases you may have. SEEK MEDICAL CARE IF:   Your condition is not improving after your transfusion.  You develop redness or irritation at the intravenous (IV) site. SEEK IMMEDIATE MEDICAL CARE IF:  Any of the following symptoms occur over the next 12 hours:  Shaking chills.  You have a temperature by mouth above 102 F (38.9 C), not controlled by medicine.  Chest, back, or muscle pain.  People around you feel you are not acting correctly or are confused.  Shortness of breath or difficulty breathing.  Dizziness and fainting.  You get a rash or develop hives.  You have a decrease in urine output.  Your urine turns a dark color or changes to pink, red, or brown. Any of the following symptoms occur over the next 10 days:  You have a temperature by mouth above 102 F (38.9 C), not controlled by medicine.  Shortness of breath.  Weakness after normal activity.  The white part of the eye turns yellow (jaundice).  You have a decrease in the  amount of urine or are urinating less often.  Your urine turns a dark color or changes to pink, red, or brown. Document Released: 04/14/2000 Document Revised: 07/10/2011 Document Reviewed: 12/02/2007 ExitCare Patient Information 2014 [place].  _______________________________________________________________________          [facility] - Preparing for Surgery Before surgery, you can play an important role.  Because skin is not sterile, your skin needs to be as free of germs as possible.  You can reduce the number of germs on your skin by washing with CHG (chlorahexidine gluconate) soap before surgery.  CHG is an antiseptic cleaner which kills germs and bonds with the skin to continue killing germs even after washing. Please DO NOT use if you have an allergy to CHG or antibacterial soaps.  If your skin becomes reddened/irritated stop using the CHG and inform your nurse when you arrive at Short Stay. Do not shave (including legs and underarms) for at least 48 hours prior to the first CHG shower.  You may shave your face/neck. Please follow these instructions carefully:  1.  Shower with CHG Soap the night before surgery and the  morning of Surgery.  2.  If you choose to wash your hair, wash your hair first as usual with your  normal  shampoo.  3.  After you shampoo, rinse your hair and body thoroughly to remove the  shampoo.                           4.  Use CHG as you would any other liquid soap.  You can apply chg directly  to the skin and wash                       Gently with a scrungie or clean washcloth.  5.  Apply the CHG Soap to your body ONLY FROM THE NECK DOWN.   Do not use on face/ open                           Wound or open sores. Avoid contact with eyes, ears mouth and genitals (private parts).                       Wash face,  Genitals (private parts) with your normal soap.             6.  Wash thoroughly, paying special attention to the area where your surgery  will be  performed.  7.  Thoroughly rinse your body with warm water from the neck down.  8.  DO NOT shower/wash with your normal soap after using and rinsing off  the CHG Soap.                9.  Pat yourself dry with a clean towel.            10.  Wear clean pajamas.            11.  Place clean sheets on your bed the night of your first shower and do not  sleep with pets. Day of Surgery : Do not apply any lotions/deodorants the morning of surgery.  Please wear clean clothes to the hospital/surgery center.  FAILURE TO FOLLOW THESE INSTRUCTIONS MAY RESULT IN THE CANCELLATION OF YOUR SURGERY PATIENT SIGNATURE_________________________________  NURSE SIGNATURE__________________________________  ________________________________________________________________________

## 2017-07-27 ENCOUNTER — Other Ambulatory Visit: Payer: Self-pay

## 2017-07-27 ENCOUNTER — Encounter (HOSPITAL_COMMUNITY): Payer: Self-pay

## 2017-07-27 ENCOUNTER — Inpatient Hospital Stay (HOSPITAL_COMMUNITY): Admission: RE | Admit: 2017-07-27 | Payer: Self-pay | Source: Ambulatory Visit

## 2017-07-27 ENCOUNTER — Encounter (HOSPITAL_COMMUNITY)
Admission: RE | Admit: 2017-07-27 | Discharge: 2017-07-27 | Disposition: A | Discharge status: Home / Self Care | Payer: 59 | Source: Ambulatory Visit | Attending: Surgery | Admitting: Surgery

## 2017-07-27 DIAGNOSIS — K5792 Diverticulitis of intestine, part unspecified, without perforation or abscess without bleeding: Secondary | ICD-10-CM | POA: Diagnosis not present

## 2017-07-27 DIAGNOSIS — Z01810 Encounter for preprocedural cardiovascular examination: Secondary | ICD-10-CM | POA: Diagnosis not present

## 2017-07-27 DIAGNOSIS — Z01812 Encounter for preprocedural laboratory examination: Secondary | ICD-10-CM | POA: Insufficient documentation

## 2017-07-27 HISTORY — DX: Diverticulitis of large intestine without perforation or abscess without bleeding: K57.32

## 2017-07-27 HISTORY — DX: Congenital bowing of long bones of leg, unspecified: Q68.5

## 2017-07-27 HISTORY — DX: Personal history of other complications of pregnancy, childbirth and the puerperium: Z87.59

## 2017-07-27 HISTORY — DX: Personal history of other infectious and parasitic diseases: Z86.19

## 2017-07-27 HISTORY — DX: Personal history of other diseases of the digestive system: Z87.19

## 2017-07-27 LAB — COMPREHENSIVE METABOLIC PANEL
ALK PHOS: 61 U/L (ref 38–126)
ALT: 38 U/L (ref 14–54)
AST: 25 U/L (ref 15–41)
Albumin: 4.4 g/dL (ref 3.5–5.0)
Anion gap: 9 (ref 5–15)
BUN: 11 mg/dL (ref 6–20)
CALCIUM: 9.4 mg/dL (ref 8.9–10.3)
CHLORIDE: 107 mmol/L (ref 101–111)
CO2: 24 mmol/L (ref 22–32)
Creatinine, Ser: 0.66 mg/dL (ref 0.44–1.00)
GFR calc Af Amer: >60 mL/min (ref >60)
GFR calc non Af Amer: >60 mL/min (ref >60)
GLUCOSE: 93 mg/dL (ref 65–99)
Potassium: 4 mmol/L (ref 3.5–5.1)
SODIUM: 140 mmol/L (ref 135–145)
Total Bilirubin: 0.7 mg/dL (ref 0.3–1.2)
Total Protein: 7.4 g/dL (ref 6.5–8.1)

## 2017-07-27 LAB — CBC WITH DIFFERENTIAL/PLATELET
BASOS ABS: 0 10*3/uL (ref 0.0–0.1)
Basophils Relative: 0 %
EOS ABS: 0.1 10*3/uL (ref 0.0–0.7)
Eosinophils Relative: 1 %
HCT: 41.7 % (ref 36.0–46.0)
HEMOGLOBIN: 14.6 g/dL (ref 12.0–15.0)
LYMPHS PCT: 35 %
Lymphs Abs: 2.2 10*3/uL (ref 0.7–4.0)
MCH: 31.1 pg (ref 26.0–34.0)
MCHC: 35 g/dL (ref 30.0–36.0)
MCV: 88.9 fL (ref 78.0–100.0)
Monocytes Absolute: 0.6 10*3/uL (ref 0.1–1.0)
Monocytes Relative: 9 %
NEUTROS PCT: 55 %
Neutro Abs: 3.6 10*3/uL (ref 1.7–7.7)
PLATELETS: 207 10*3/uL (ref 150–400)
RBC: 4.69 MIL/uL (ref 3.87–5.11)
RDW: 12.6 % (ref 11.5–15.5)
WBC: 6.4 10*3/uL (ref 4.0–10.5)

## 2017-07-27 LAB — HEMOGLOBIN A1C
Hgb A1c MFr Bld: 4.7 % — ABNORMAL LOW (ref 4.8–5.6)
Mean Plasma Glucose: 88.19 mg/dL

## 2017-07-27 MED ORDER — CHLORHEXIDINE GLUCONATE CLOTH 2 % EX PADS
6.0000 | MEDICATED_PAD | Freq: Once | CUTANEOUS | Status: DC
  Filled 2017-07-27: qty 6

## 2017-07-27 NOTE — Consult Note (Addendum)
[facility] Nurse requested for preoperative stoma site marking  Discussed surgical procedure and stoma creation with patient.  Explained role of the [facility] nurse team.  Provided the patient with educational booklet and provided samples of pouching options.  Answered patient's questions.  Discussed that marking is "just in case" the surgeon would need to perform an ostomy during surgery.  Examined patient sitting and standing in order to place the marking in the patient's visual field, away from any creases or abdominal contour issues and within the rectus muscle.  Attempted to mark below the patient's belt line, but this was not possible since a significant crease appears on the lower abd when she leans forward and should be avoided if possible.   Marked for colostomy in the LLQ  __5__ cm to the left of the umbilicus and _1___QA above the umbilicus.  Marked for ileostomy in the RLQ  ___5_cm to the right of the umbilicus and  __4__ cm above the umbilicus.  Patient's abdomen cleansed with CHG wipes at site markings, allowed to air dry prior to marking.Covered mark with thin film transparent dressing to preserve mark until date of surgery. Gave patient the marking pen and instructed to re-color the mark in before surgery if the Tegaderm falls off or the mark begins to fade.  [facility] Nurse team will follow up with patient after surgery for continue ostomy care and teaching if ostomy surgery is required.  Please re-consult if further assistance is needed.  Thank-you,  Julien Girt MSN, [place], [place], [place], [place]

## 2017-07-30 NOTE — Progress Notes (Signed)
Final EKG dated 07-27-2017 in epic.

## 2017-08-13 ENCOUNTER — Other Ambulatory Visit: Payer: Self-pay | Admitting: *Deleted

## 2017-08-13 NOTE — Patient Outreach (Signed)
[facility]) Care Management  08/13/2017  Pamela Lara Sep 12, 1960 [id number]   Subjective: Telephone call to patient's home / mobile number, spoke with patient, and HIPAA verified.  Discussed [facility] Care Management UMR Transition of care follow up, preoperative call follow up, patient voiced understanding, and is in agreement to both types of follow up.   Patient states she is doing well, ready for surgery at [facility] on 4/171/19, estimated length of stay 2 -4 days, and husband will assist as needed post hospitalization.   States she has a follow up appointment with primary MD and surgeon on 09/18/17.   Patient voices understanding of medical diagnosis. Pending surgery, and treatment plan.  States she is accessing the following [facility] benefits: outpatient pharmacy, hospital indemnity (has received information form benefit specialist to file claim), and has family medical leave act (FMLA) in place.  Patient states she does not have any preoperative questions, care coordination, disease management, disease monitoring, transportation, community resource, or pharmacy needs at this time.   States she is very appreciative of the follow up and is in agreement to receive [facility] Management information post transition of care follow up.      Objective: Per KPN (Knowledge Performance Now, point of care tool) and chart review, patient to be admitted 08/15/17 for LAPAROSCOPIC VS [place] and CYSTOSCOPY WITH BILATERAL STENT PLACEMENT at [facility].   Patient also has a history of Arthritis (right shoulder),BPPV (benign paroxysmal positional vertigo), H. pylori infection, PONV (postoperative nausea and vomiting), and  Seasonal affective disorder.    Assessment: Received UMR Preoperative / Transition of care referral on 07/27/17.  Preoperative call completed, and transition of care follow up pending notification of patient  discharge.     Plan: RNCM will call patient for  telephone outreach attempt, transition of care follow up, within 3 business days of hospital discharge notification.      Stevie Charter H. Annia Friendly, BSN, [place] Management [facility] Telephonic CM Phone: [phone] Fax: [phone]

## 2017-08-15 ENCOUNTER — Encounter (HOSPITAL_COMMUNITY): Payer: Self-pay | Admitting: Emergency Medicine

## 2017-08-15 ENCOUNTER — Inpatient Hospital Stay (HOSPITAL_COMMUNITY): Payer: 59 | Admitting: Certified Registered Nurse Anesthetist

## 2017-08-15 ENCOUNTER — Inpatient Hospital Stay (HOSPITAL_COMMUNITY)
Admission: RE | Admit: 2017-08-15 | Discharge: 2017-08-17 | DRG: 331 | Disposition: A | Discharge status: Home / Self Care | Payer: 59 | Source: Ambulatory Visit | Attending: Surgery | Admitting: Surgery

## 2017-08-15 ENCOUNTER — Other Ambulatory Visit: Payer: Self-pay

## 2017-08-15 ENCOUNTER — Encounter (HOSPITAL_COMMUNITY)
Admission: RE | Disposition: A | Discharge status: Home / Self Care | Payer: Self-pay | Source: Ambulatory Visit | Attending: Surgery

## 2017-08-15 ENCOUNTER — Inpatient Hospital Stay (HOSPITAL_COMMUNITY): Payer: 59

## 2017-08-15 DIAGNOSIS — Z809 Family history of malignant neoplasm, unspecified: Secondary | ICD-10-CM

## 2017-08-15 DIAGNOSIS — F39 Unspecified mood [affective] disorder: Secondary | ICD-10-CM | POA: Diagnosis present

## 2017-08-15 DIAGNOSIS — K621 Rectal polyp: Secondary | ICD-10-CM | POA: Diagnosis present

## 2017-08-15 DIAGNOSIS — K5732 Diverticulitis of large intestine without perforation or abscess without bleeding: Secondary | ICD-10-CM | POA: Diagnosis not present

## 2017-08-15 DIAGNOSIS — Z8261 Family history of arthritis: Secondary | ICD-10-CM

## 2017-08-15 DIAGNOSIS — D125 Benign neoplasm of sigmoid colon: Secondary | ICD-10-CM | POA: Diagnosis not present

## 2017-08-15 DIAGNOSIS — K5792 Diverticulitis of intestine, part unspecified, without perforation or abscess without bleeding: Secondary | ICD-10-CM | POA: Diagnosis present

## 2017-08-15 DIAGNOSIS — Z888 Allergy status to other drugs, medicaments and biological substances status: Secondary | ICD-10-CM | POA: Diagnosis not present

## 2017-08-15 DIAGNOSIS — Z408 Encounter for other prophylactic surgery: Secondary | ICD-10-CM | POA: Diagnosis not present

## 2017-08-15 DIAGNOSIS — Z91041 Radiographic dye allergy status: Secondary | ICD-10-CM | POA: Diagnosis not present

## 2017-08-15 DIAGNOSIS — Z9049 Acquired absence of other specified parts of digestive tract: Secondary | ICD-10-CM | POA: Diagnosis not present

## 2017-08-15 DIAGNOSIS — K219 Gastro-esophageal reflux disease without esophagitis: Secondary | ICD-10-CM | POA: Diagnosis present

## 2017-08-15 DIAGNOSIS — F329 Major depressive disorder, single episode, unspecified: Secondary | ICD-10-CM | POA: Diagnosis not present

## 2017-08-15 DIAGNOSIS — Z885 Allergy status to narcotic agent status: Secondary | ICD-10-CM | POA: Diagnosis not present

## 2017-08-15 DIAGNOSIS — Z8249 Family history of ischemic heart disease and other diseases of the circulatory system: Secondary | ICD-10-CM | POA: Diagnosis not present

## 2017-08-15 DIAGNOSIS — Z87891 Personal history of nicotine dependence: Secondary | ICD-10-CM | POA: Diagnosis not present

## 2017-08-15 DIAGNOSIS — Z884 Allergy status to anesthetic agent status: Secondary | ICD-10-CM | POA: Diagnosis not present

## 2017-08-15 DIAGNOSIS — M1990 Unspecified osteoarthritis, unspecified site: Secondary | ICD-10-CM | POA: Diagnosis present

## 2017-08-15 DIAGNOSIS — K5730 Diverticulosis of large intestine without perforation or abscess without bleeding: Secondary | ICD-10-CM | POA: Diagnosis not present

## 2017-08-15 HISTORY — PX: LAPAROSCOPIC SIGMOID COLECTOMY: SHX5928

## 2017-08-15 HISTORY — PX: CYSTOSCOPY WITH STENT PLACEMENT: SHX5790

## 2017-08-15 LAB — CREATININE, SERUM: Creatinine, Ser: 0.98 mg/dL (ref 0.44–1.00)

## 2017-08-15 LAB — CBC
HEMATOCRIT: 40.6 % (ref 36.0–46.0)
HEMOGLOBIN: 14.2 g/dL (ref 12.0–15.0)
MCH: 30.7 pg (ref 26.0–34.0)
MCHC: 35 g/dL (ref 30.0–36.0)
MCV: 87.9 fL (ref 78.0–100.0)
Platelets: 215 10*3/uL (ref 150–400)
RBC: 4.62 MIL/uL (ref 3.87–5.11)
RDW: 12.3 % (ref 11.5–15.5)
WBC: 14.3 10*3/uL — ABNORMAL HIGH (ref 4.0–10.5)

## 2017-08-15 LAB — TYPE AND SCREEN
ABO/RH(D): B POS
ANTIBODY SCREEN: NEGATIVE

## 2017-08-15 LAB — ABO/RH: ABO/RH(D): B POS

## 2017-08-15 SURGERY — COLECTOMY, SIGMOID, LAPAROSCOPIC
Anesthesia: General | Site: Ureter

## 2017-08-15 MED ORDER — DIPHENHYDRAMINE HCL 50 MG/ML IJ SOLN: 12.5000 mg | Freq: Four times a day (QID) | INTRAMUSCULAR | Status: DC | PRN

## 2017-08-15 MED ORDER — PROMETHAZINE HCL 25 MG/ML IJ SOLN
INTRAMUSCULAR | Status: AC
  Administered 2017-08-15: 6.25 mg
  Filled 2017-08-15: qty 1

## 2017-08-15 MED ORDER — HYDROMORPHONE HCL 1 MG/ML IJ SOLN
0.2500 mg | INTRAMUSCULAR | Status: DC | PRN
  Administered 2017-08-15: 0.5 mg via INTRAVENOUS

## 2017-08-15 MED ORDER — SCOPOLAMINE 1 MG/3DAYS TD PT72
MEDICATED_PATCH | TRANSDERMAL | Status: AC
  Filled 2017-08-15: qty 1

## 2017-08-15 MED ORDER — FENTANYL CITRATE (PF) 250 MCG/5ML IJ SOLN
INTRAMUSCULAR | Status: AC
  Filled 2017-08-15: qty 5

## 2017-08-15 MED ORDER — HYDROMORPHONE HCL 1 MG/ML IJ SOLN
INTRAMUSCULAR | Status: AC
  Filled 2017-08-15: qty 1

## 2017-08-15 MED ORDER — ROCURONIUM BROMIDE 10 MG/ML (PF) SYRINGE
PREFILLED_SYRINGE | INTRAVENOUS | Status: AC
  Filled 2017-08-15: qty 5

## 2017-08-15 MED ORDER — TRAMADOL HCL 50 MG PO TABS: 50.0000 mg | ORAL_TABLET | Freq: Four times a day (QID) | ORAL | Status: DC | PRN

## 2017-08-15 MED ORDER — BUPIVACAINE-EPINEPHRINE 0.5% -1:200000 IJ SOLN
INTRAMUSCULAR | Status: DC | PRN
  Administered 2017-08-15: 15 mL

## 2017-08-15 MED ORDER — SODIUM CHLORIDE 0.9 % IJ SOLN
INTRAMUSCULAR | Status: AC
  Filled 2017-08-15: qty 50

## 2017-08-15 MED ORDER — ONDANSETRON HCL 4 MG/2ML IJ SOLN
INTRAMUSCULAR | Status: AC
  Filled 2017-08-15: qty 2

## 2017-08-15 MED ORDER — KETOROLAC TROMETHAMINE 15 MG/ML IJ SOLN
15.0000 mg | INTRAMUSCULAR | Status: DC
  Administered 2017-08-15: 15 mg via INTRAVENOUS
  Filled 2017-08-15: qty 1

## 2017-08-15 MED ORDER — BUPIVACAINE-EPINEPHRINE (PF) 0.5% -1:200000 IJ SOLN
INTRAMUSCULAR | Status: AC
  Filled 2017-08-15: qty 30

## 2017-08-15 MED ORDER — DEXAMETHASONE SODIUM PHOSPHATE 10 MG/ML IJ SOLN
INTRAMUSCULAR | Status: DC | PRN
  Administered 2017-08-15: 8 mg via INTRAVENOUS

## 2017-08-15 MED ORDER — LIDOCAINE 2% (20 MG/ML) 5 ML SYRINGE
INTRAMUSCULAR | Status: DC | PRN
  Administered 2017-08-15: 1.5 mg/kg/h via INTRAVENOUS

## 2017-08-15 MED ORDER — ONDANSETRON HCL 4 MG/2ML IJ SOLN
4.0000 mg | Freq: Once | INTRAMUSCULAR | Status: AC | PRN
  Administered 2017-08-15: 4 mg via INTRAVENOUS

## 2017-08-15 MED ORDER — ONDANSETRON HCL 4 MG PO TABS: 4.0000 mg | ORAL_TABLET | Freq: Four times a day (QID) | ORAL | Status: DC | PRN

## 2017-08-15 MED ORDER — SUGAMMADEX SODIUM 200 MG/2ML IV SOLN
INTRAVENOUS | Status: DC | PRN
  Administered 2017-08-15: 200 mg via INTRAVENOUS

## 2017-08-15 MED ORDER — MIDAZOLAM HCL 5 MG/5ML IJ SOLN
INTRAMUSCULAR | Status: DC | PRN
  Administered 2017-08-15: 2 mg via INTRAVENOUS

## 2017-08-15 MED ORDER — KETAMINE HCL 10 MG/ML IJ SOLN
INTRAMUSCULAR | Status: DC | PRN
  Administered 2017-08-15: 30 mg via INTRAVENOUS

## 2017-08-15 MED ORDER — FENTANYL CITRATE (PF) 250 MCG/5ML IJ SOLN
INTRAMUSCULAR | Status: DC | PRN
  Administered 2017-08-15: 50 ug via INTRAVENOUS
  Administered 2017-08-15: 150 ug via INTRAVENOUS
  Administered 2017-08-15: 50 ug via INTRAVENOUS

## 2017-08-15 MED ORDER — MIDAZOLAM HCL 2 MG/2ML IJ SOLN
INTRAMUSCULAR | Status: AC
  Filled 2017-08-15: qty 2

## 2017-08-15 MED ORDER — ALVIMOPAN 12 MG PO CAPS
12.0000 mg | ORAL_CAPSULE | Freq: Two times a day (BID) | ORAL | Status: DC
  Administered 2017-08-16 (×2): 12 mg via ORAL
  Filled 2017-08-15 (×2): qty 1

## 2017-08-15 MED ORDER — LACTATED RINGERS IV SOLN
INTRAVENOUS | Status: DC
  Administered 2017-08-15 (×2): via INTRAVENOUS

## 2017-08-15 MED ORDER — EPHEDRINE SULFATE-NACL 50-0.9 MG/10ML-% IV SOSY
PREFILLED_SYRINGE | INTRAVENOUS | Status: DC | PRN
  Administered 2017-08-15: 5 mg via INTRAVENOUS
  Administered 2017-08-15 (×2): 10 mg via INTRAVENOUS

## 2017-08-15 MED ORDER — LACTATED RINGERS IV SOLN
INTRAVENOUS | Status: DC
  Administered 2017-08-15 – 2017-08-16 (×3): via INTRAVENOUS

## 2017-08-15 MED ORDER — CEFOTETAN DISODIUM-DEXTROSE 2-2.08 GM-%(50ML) IV SOLR
2.0000 g | INTRAVENOUS | Status: AC
  Administered 2017-08-15: 2 g via INTRAVENOUS

## 2017-08-15 MED ORDER — SUCCINYLCHOLINE CHLORIDE 200 MG/10ML IV SOSY
PREFILLED_SYRINGE | INTRAVENOUS | Status: DC | PRN
  Administered 2017-08-15: 160 mg via INTRAVENOUS

## 2017-08-15 MED ORDER — IOPAMIDOL (ISOVUE-300) INJECTION 61%
INTRAVENOUS | Status: AC
  Filled 2017-08-15: qty 50

## 2017-08-15 MED ORDER — KETAMINE HCL 10 MG/ML IJ SOLN
INTRAMUSCULAR | Status: AC
  Filled 2017-08-15: qty 1

## 2017-08-15 MED ORDER — LIDOCAINE 2% (20 MG/ML) 5 ML SYRINGE
INTRAMUSCULAR | Status: DC | PRN
  Administered 2017-08-15: 100 mg via INTRAVENOUS

## 2017-08-15 MED ORDER — LIDOCAINE HCL 2 % IJ SOLN
INTRAMUSCULAR | Status: AC
  Filled 2017-08-15: qty 20

## 2017-08-15 MED ORDER — SODIUM CHLORIDE 0.9 % IR SOLN
Status: DC | PRN
  Administered 2017-08-15: 2000 mL

## 2017-08-15 MED ORDER — LACTATED RINGERS IR SOLN
Status: DC | PRN
  Administered 2017-08-15: 1000 mL

## 2017-08-15 MED ORDER — ACETAMINOPHEN 500 MG PO TABS
1000.0000 mg | ORAL_TABLET | Freq: Four times a day (QID) | ORAL | Status: AC
  Administered 2017-08-15 – 2017-08-16 (×4): 1000 mg via ORAL
  Filled 2017-08-15 (×4): qty 2

## 2017-08-15 MED ORDER — CEFOTETAN DISODIUM-DEXTROSE 2-2.08 GM-%(50ML) IV SOLR
INTRAVENOUS | Status: AC
  Filled 2017-08-15: qty 50

## 2017-08-15 MED ORDER — ALUM & MAG HYDROXIDE-SIMETH 200-200-20 MG/5ML PO SUSP: 30.0000 mL | Freq: Four times a day (QID) | ORAL | Status: DC | PRN

## 2017-08-15 MED ORDER — PROPOFOL 10 MG/ML IV BOLUS
INTRAVENOUS | Status: DC | PRN
  Administered 2017-08-15: 100 mg via INTRAVENOUS

## 2017-08-15 MED ORDER — ROCURONIUM BROMIDE 10 MG/ML (PF) SYRINGE
PREFILLED_SYRINGE | INTRAVENOUS | Status: DC | PRN
  Administered 2017-08-15: 50 mg via INTRAVENOUS
  Administered 2017-08-15: 30 mg via INTRAVENOUS

## 2017-08-15 MED ORDER — PHENYLEPHRINE 40 MCG/ML (10ML) SYRINGE FOR IV PUSH (FOR BLOOD PRESSURE SUPPORT)
PREFILLED_SYRINGE | INTRAVENOUS | Status: DC | PRN
  Administered 2017-08-15 (×4): 80 ug via INTRAVENOUS

## 2017-08-15 MED ORDER — METRONIDAZOLE 500 MG PO TABS
1000.0000 mg | ORAL_TABLET | ORAL | Status: DC
  Filled 2017-08-15: qty 2

## 2017-08-15 MED ORDER — ONDANSETRON HCL 4 MG/2ML IJ SOLN
4.0000 mg | Freq: Four times a day (QID) | INTRAMUSCULAR | Status: DC | PRN
Start: 2017-08-15 — End: 2017-08-17

## 2017-08-15 MED ORDER — HEPARIN SODIUM (PORCINE) 5000 UNIT/ML IJ SOLN
5000.0000 [IU] | Freq: Three times a day (TID) | INTRAMUSCULAR | Status: DC
  Administered 2017-08-15 – 2017-08-17 (×5): 5000 [IU] via SUBCUTANEOUS
  Filled 2017-08-15 (×6): qty 1

## 2017-08-15 MED ORDER — BUPIVACAINE LIPOSOME 1.3 % IJ SUSP
20.0000 mL | Freq: Once | INTRAMUSCULAR | Status: AC
  Administered 2017-08-15: 20 mL
  Filled 2017-08-15: qty 20

## 2017-08-15 MED ORDER — SUCCINYLCHOLINE CHLORIDE 200 MG/10ML IV SOSY
PREFILLED_SYRINGE | INTRAVENOUS | Status: AC
  Filled 2017-08-15: qty 10

## 2017-08-15 MED ORDER — LIDOCAINE 2% (20 MG/ML) 5 ML SYRINGE
INTRAMUSCULAR | Status: AC
  Filled 2017-08-15: qty 5

## 2017-08-15 MED ORDER — POLYETHYLENE GLYCOL 3350 17 GM/SCOOP PO POWD
1.0000 | Freq: Once | ORAL | Status: DC
  Filled 2017-08-15: qty 255

## 2017-08-15 MED ORDER — DIPHENHYDRAMINE HCL 12.5 MG/5ML PO ELIX: 12.5000 mg | ORAL_SOLUTION | Freq: Four times a day (QID) | ORAL | Status: DC | PRN

## 2017-08-15 MED ORDER — ACETAMINOPHEN 500 MG PO TABS
1000.0000 mg | ORAL_TABLET | ORAL | Status: AC
  Administered 2017-08-15: 1000 mg via ORAL
  Filled 2017-08-15: qty 2

## 2017-08-15 MED ORDER — DEXAMETHASONE SODIUM PHOSPHATE 10 MG/ML IJ SOLN
INTRAMUSCULAR | Status: AC
  Filled 2017-08-15: qty 1

## 2017-08-15 MED ORDER — ALVIMOPAN 12 MG PO CAPS
12.0000 mg | ORAL_CAPSULE | ORAL | Status: AC
  Administered 2017-08-15: 12 mg via ORAL
  Filled 2017-08-15: qty 1

## 2017-08-15 MED ORDER — HYDRALAZINE HCL 20 MG/ML IJ SOLN: 10.0000 mg | INTRAMUSCULAR | Status: DC | PRN

## 2017-08-15 MED ORDER — IBUPROFEN 200 MG PO TABS
600.0000 mg | ORAL_TABLET | Freq: Four times a day (QID) | ORAL | Status: DC
  Administered 2017-08-15 – 2017-08-17 (×7): 600 mg via ORAL
  Filled 2017-08-15 (×8): qty 3

## 2017-08-15 MED ORDER — HYDROMORPHONE HCL 1 MG/ML IJ SOLN
0.5000 mg | INTRAMUSCULAR | Status: DC | PRN
Start: 2017-08-15 — End: 2017-08-17
  Administered 2017-08-15: 0.5 mg via INTRAVENOUS
  Filled 2017-08-15: qty 0.5

## 2017-08-15 MED ORDER — GABAPENTIN 300 MG PO CAPS
300.0000 mg | ORAL_CAPSULE | ORAL | Status: AC
  Administered 2017-08-15: 300 mg via ORAL
  Filled 2017-08-15: qty 1

## 2017-08-15 MED ORDER — IOHEXOL 300 MG/ML  SOLN
INTRAMUSCULAR | Status: DC | PRN
  Administered 2017-08-15: 20 mL via URETHRAL

## 2017-08-15 MED ORDER — ACETAMINOPHEN 500 MG PO TABS: 1000.0000 mg | ORAL_TABLET | Freq: Four times a day (QID) | ORAL | Status: DC

## 2017-08-15 MED ORDER — GABAPENTIN 300 MG PO CAPS
300.0000 mg | ORAL_CAPSULE | Freq: Three times a day (TID) | ORAL | Status: DC
  Administered 2017-08-15 – 2017-08-17 (×5): 300 mg via ORAL
  Filled 2017-08-15 (×6): qty 1

## 2017-08-15 MED ORDER — SACCHAROMYCES BOULARDII 250 MG PO CAPS
250.0000 mg | ORAL_CAPSULE | Freq: Two times a day (BID) | ORAL | Status: DC
  Administered 2017-08-15 – 2017-08-17 (×4): 250 mg via ORAL
  Filled 2017-08-15 (×5): qty 1

## 2017-08-15 MED ORDER — MEPERIDINE HCL 50 MG/ML IJ SOLN: 6.2500 mg | INTRAMUSCULAR | Status: DC | PRN

## 2017-08-15 MED ORDER — LIDOCAINE HCL 2 % EX GEL
CUTANEOUS | Status: AC
  Filled 2017-08-15: qty 5

## 2017-08-15 MED ORDER — ENSURE SURGERY PO LIQD
237.0000 mL | Freq: Two times a day (BID) | ORAL | Status: DC
  Administered 2017-08-16 – 2017-08-17 (×3): 237 mL via ORAL
  Filled 2017-08-15 (×4): qty 237

## 2017-08-15 MED ORDER — SODIUM CHLORIDE 0.9 % IJ SOLN
INTRAMUSCULAR | Status: DC | PRN
  Administered 2017-08-15: 15 mL

## 2017-08-15 MED ORDER — LIDOCAINE 2% (20 MG/ML) 5 ML SYRINGE: INTRAMUSCULAR | Status: DC | PRN

## 2017-08-15 MED ORDER — PROPOFOL 10 MG/ML IV BOLUS
INTRAVENOUS | Status: AC
  Filled 2017-08-15: qty 20

## 2017-08-15 MED ORDER — SUGAMMADEX SODIUM 200 MG/2ML IV SOLN
INTRAVENOUS | Status: AC
  Filled 2017-08-15: qty 2

## 2017-08-15 MED ORDER — ONDANSETRON HCL 4 MG/2ML IJ SOLN
INTRAMUSCULAR | Status: DC | PRN
  Administered 2017-08-15: 8 mg via INTRAVENOUS

## 2017-08-15 MED ORDER — SCOPOLAMINE 1 MG/3DAYS TD PT72
MEDICATED_PATCH | TRANSDERMAL | Status: DC | PRN
  Administered 2017-08-15: 1 via TRANSDERMAL

## 2017-08-15 MED ORDER — SODIUM CHLORIDE 0.9 % IR SOLN
Status: DC | PRN
  Administered 2017-08-15: 3000 mL via INTRAVESICAL

## 2017-08-15 MED ORDER — NEOMYCIN SULFATE 500 MG PO TABS
1000.0000 mg | ORAL_TABLET | ORAL | Status: DC
  Filled 2017-08-15: qty 2

## 2017-08-15 SURGICAL SUPPLY — 66 items
APPLIER CLIP ROT 10 11.4 M/L (STAPLE) ×4
BAG URO CATCHER STRL LF (MISCELLANEOUS) ×4 IMPLANT
BLADE CLIPPER SURG (BLADE) IMPLANT
CATH INTERMIT  6FR 70CM (CATHETERS) ×6 IMPLANT
CELLS DAT CNTRL 66122 CELL SVR (MISCELLANEOUS) IMPLANT
CHLORAPREP W/TINT 26ML (MISCELLANEOUS) ×4 IMPLANT
CLIP APPLIE ROT 10 11.4 M/L (STAPLE) ×2 IMPLANT
CLOTH BEACON ORANGE TIMEOUT ST (SAFETY) ×4 IMPLANT
COVER FOOTSWITCH UNIV (MISCELLANEOUS) IMPLANT
COVER MAYO STAND STRL (DRAPES) ×8 IMPLANT
COVER SURGICAL LIGHT HANDLE (MISCELLANEOUS) ×12 IMPLANT
DERMABOND ADVANCED (GAUZE/BANDAGES/DRESSINGS) ×2
DERMABOND ADVANCED .7 DNX12 (GAUZE/BANDAGES/DRESSINGS) IMPLANT
DRAPE SURG IRRIG POUCH 19X23 (DRAPES) ×2 IMPLANT
DRAPE UTILITY XL STRL (DRAPES) ×4 IMPLANT
DRAPE WARM FLUID 44X44 (DRAPE) ×2 IMPLANT
DRSG OPSITE POSTOP 4X10 (GAUZE/BANDAGES/DRESSINGS) IMPLANT
DRSG OPSITE POSTOP 4X8 (GAUZE/BANDAGES/DRESSINGS) IMPLANT
ELECT CAUTERY BLADE 6.4 (BLADE) ×8 IMPLANT
ELECT REM PT RETURN 15FT ADLT (MISCELLANEOUS) ×4 IMPLANT
GLOVE BIOGEL M STRL SZ7.5 (GLOVE) ×12 IMPLANT
GLOVE BIOGEL PI IND STRL 8 (GLOVE) ×4 IMPLANT
GLOVE BIOGEL PI INDICATOR 8 (GLOVE) ×4
GOWN STRL REUS W/ TWL LRG LVL3 (GOWN DISPOSABLE) ×12 IMPLANT
GOWN STRL REUS W/ TWL XL LVL3 (GOWN DISPOSABLE) ×4 IMPLANT
GOWN STRL REUS W/TWL LRG LVL3 (GOWN DISPOSABLE) ×20 IMPLANT
GOWN STRL REUS W/TWL XL LVL3 (GOWN DISPOSABLE) ×4
GUIDEWIRE STR DUAL SENSOR (WIRE) ×4 IMPLANT
KIT BASIN OR (CUSTOM PROCEDURE TRAY) ×4 IMPLANT
LEGGING LITHOTOMY PAIR STRL (DRAPES) ×4 IMPLANT
MANIFOLD NEPTUNE II (INSTRUMENTS) ×4 IMPLANT
NDL INSUFFLATION 14GA 120MM (NEEDLE) IMPLANT
NEEDLE INSUFFLATION 14GA 120MM (NEEDLE) ×4 IMPLANT
PACK COLON (CUSTOM PROCEDURE TRAY) ×4 IMPLANT
PACK CYSTO (CUSTOM PROCEDURE TRAY) ×4 IMPLANT
RETRACTOR WND ALEXIS 18 MED (MISCELLANEOUS) IMPLANT
RTRCTR WOUND ALEXIS 18CM MED (MISCELLANEOUS)
SCISSORS LAP 5X35 DISP (ENDOMECHANICALS) ×4 IMPLANT
SEALER TISSUE G2 STRG ARTC 35C (ENDOMECHANICALS) ×2 IMPLANT
SET IRRIG TUBING LAPAROSCOPIC (IRRIGATION / IRRIGATOR) IMPLANT
SHEARS HARMONIC ACE PLUS 36CM (ENDOMECHANICALS) ×2 IMPLANT
SLEEVE ADV FIXATION 5X100MM (TROCAR) ×6 IMPLANT
SLEEVE ENDOPATH XCEL 5M (ENDOMECHANICALS) ×4 IMPLANT
STAPLER CUT CVD 40MM BLUE (STAPLE) ×2 IMPLANT
STAPLER TA28 THK CONTR EEA XL (STAPLE) ×2 IMPLANT
STAPLER VISISTAT 35W (STAPLE) ×4 IMPLANT
SUCTION POOLE TIP (SUCTIONS) ×4 IMPLANT
SUT PDS AB 1 TP1 96 (SUTURE) ×8 IMPLANT
SUT PROLENE 2 0 CT2 30 (SUTURE) IMPLANT
SUT PROLENE 2 0 KS (SUTURE) IMPLANT
SUT SILK 2 0 SH CR/8 (SUTURE) ×4 IMPLANT
SUT SILK 3 0 SH CR/8 (SUTURE) ×4 IMPLANT
SYR BULB IRRIGATION 50ML (SYRINGE) ×4 IMPLANT
SYS LAPSCP GELPORT 120MM (MISCELLANEOUS)
SYSTEM LAPSCP GELPORT 120MM (MISCELLANEOUS) IMPLANT
TOWEL OR 17X26 10 PK STRL BLUE (TOWEL DISPOSABLE) ×8 IMPLANT
TRAY FOLEY CATH 14FR (SET/KITS/TRAYS/PACK) ×2 IMPLANT
TROCAR ADV FIXATION 5X100MM (TROCAR) ×4 IMPLANT
TROCAR BLADELESS OPT 5 100 (ENDOMECHANICALS) ×4 IMPLANT
TROCAR XCEL 12X100 BLDLESS (ENDOMECHANICALS) IMPLANT
TROCAR XCEL BLUNT TIP 100MML (ENDOMECHANICALS) IMPLANT
TROCAR XCEL NON-BLD 11X100MML (ENDOMECHANICALS) IMPLANT
TUBING CONNECTING 10 (TUBING) ×4 IMPLANT
TUBING CONNECTING 10' (TUBING) ×2
TUBING INSUF HEATED (TUBING) ×4 IMPLANT
YANKAUER SUCT BULB TIP NO VENT (SUCTIONS) ×8 IMPLANT

## 2017-08-15 NOTE — Progress Notes (Signed)
Another Dilaudid 0.5 mg given, for a total of 1 mg in pacu.

## 2017-08-15 NOTE — H&P (Signed)
H&P Physician requesting consult: Nadeen Landau, MD  Chief Complaint: Recurrent diverticulitis  History of Present Illness: 57 year old female with a history of recurrent diverticulitis presents today for colectomy.  Dr. Dema Severin requested intraoperative ureteral stent placement bilaterally.  Patient has no complaints today.  Past Medical History:  Diagnosis Date  . Arthritis    right shoulder  . BPPV (benign paroxysmal positional vertigo)   . Congenital bowed legs    s/p  surgical correction  . Diverticulitis, colon    recurrent  . GERD (gastroesophageal reflux disease)    watch diet (as needed takes apple cider vinager)  . History of colonic diverticulitis    hx multiple acute diverticulitis episodes treated medically  . History of ectopic pregnancy   . History of Helicobacter pylori infection 2013  approx.  Marland Kitchen PONV (postoperative nausea and vomiting)   . Seasonal affective disorder [facility])    Past Surgical History:  Procedure Laterality Date  . CHOLECYSTECTOMY OPEN  1984  . COLONOSCOPY WITH PROPOFOL N/A 04/19/2017   Procedure: COLONOSCOPY WITH PROPOFOL;  Surgeon: Lucilla Lame, MD;  Location: [facility];  Service: Endoscopy;  Laterality: N/A;  . EXCISIONAL SENTINAL LYMPH NODE BIOPSY, NECK  2016  approx.   per pt thyroid area , benign  . LAPAROSCOPY FOR ECTOPIC PREGNANCY  2001   also  BILATERAL TUBAL LIGATION  . OSTEOTOMY Bilateral 1980 and 1981   correction congenital bow legged  . TONSILLECTOMY      Home Medications:  Medications Prior to Admission  Medication Sig Dispense Refill Last Dose  . calcium carbonate (TUMS - DOSED IN MG ELEMENTAL CALCIUM) 500 MG chewable tablet Chew 1 tablet by mouth as needed for indigestion or heartburn.   Past Month at Unknown time  . Cholecalciferol (VITAMIN D3) 1000 UNITS CAPS Take 1,000 Units by mouth daily.    08/10/2017  . FIBER PO Take 625 mg by mouth daily.    08/10/2017  . [place] Ginseng 1000 MG TABS Take 1,000 mg by mouth  daily.    08/10/2017  . metroNIDAZOLE (FLAGYL) 500 MG tablet Take 1,000 mg by mouth See admin instructions. Take 1000 mg by mouth at 1400, 1500 and 2200 prior to surgery  0 08/14/2017 at Unknown time  . neomycin (MYCIFRADIN) 500 MG tablet Take 1,000 mg by mouth See admin instructions. Take 1000 mg by mouth at 1400, 1500 and 2200 prior to surgery  0 08/14/2017 at Unknown time  . Omega-3 Fatty Acids (FISH OIL) 1000 MG CAPS Take 1,000 mg by mouth daily.    08/10/2017  . polyethylene glycol powder (GLYCOLAX/MIRALAX) powder Take 0.5 Containers by mouth once.   08/14/2017 at Unknown time  . varenicline (CHANTIX CONTINUING MONTH PAK) 1 MG tablet Take 1 tablet (1 mg total) by mouth 2 (two) times daily. KEEP ON FILE FOR 2ND MONTH/FUTURE REFILL (Patient not taking: Reported on 07/24/2017) 60 tablet 2 Completed Course at Unknown time  . varenicline (CHANTIX STARTING MONTH PAK) 0.5 MG X 11 & 1 MG X 42 tablet Take one 0.5 mg tablet by mouth once daily for 3 days, then increase to one 0.5 mg tablet twice daily for 4 days, then increase to one 1 mg tablet twice daily. (Patient not taking: Reported on 07/24/2017) 53 tablet 0 Completed Course at Unknown time   Allergies:  Allergies  Allergen Reactions  . Ivp Dye [Iodinated Diagnostic Agents] Shortness Of Breath  . Morphine And Related Nausea And Vomiting  . Other     General anesthesia makes her  nauseated   . Wellbutrin [Bupropion] Other (See Comments)    Joint pain    Family History  Problem Relation Age of Onset  . Cancer Mother   . Arthritis Mother   . Hypertension Mother   . Cancer Father 9       Mesothelioma  . Arthritis Father   . Hypertension Father   . Breast cancer Neg Hx    Social History:  reports that she quit smoking about 6 weeks ago. Her smoking use included cigarettes. She has a 9.50 pack-year smoking history. She has never used smokeless tobacco. She reports that she drinks alcohol. She reports that she does not use drugs.  ROS: A  complete review of systems was performed.  All systems are negative except for pertinent findings as noted. ROS   Physical Exam:  Vital signs in last 24 hours: Temp:  [98.4 F (36.9 C)] 98.4 F (36.9 C) (04/17 0640) Pulse Rate:  [74] 74 (04/17 0640) Resp:  [18] 18 (04/17 0640) BP: (114)/(74) 114/74 (04/17 0640) SpO2:  [100 %] 100 % (04/17 0640) Weight:  [76.1 kg (167 lb 12.8 oz)] 76.1 kg (167 lb 12.8 oz) (04/17 0730) General:  Alert and oriented, No acute distress HEENT: Normocephalic, atraumatic Neck: No JVD or lymphadenopathy Cardiovascular: Regular rate and rhythm Lungs: Regular rate and effort Abdomen: Soft, nontender, nondistended, no abdominal masses Back: No CVA tenderness Extremities: No edema Neurologic: Grossly intact  Laboratory Data:  Results for orders placed or performed during the hospital encounter of 08/15/17 (from the past 24 hour(s))  Type and screen [place]     Status: None   Collection Time: 08/15/17  7:13 AM  Result Value Ref Range   ABO/RH(D) B POS    Antibody Screen NEG    Sample Expiration      08/18/2017 Performed at [facility], [place] [address], [place], [place] [address]    No results found for this or any previous visit (from the past 240 hour(s)). Creatinine: No results for input(s): CREATININE in the last 168 hours.  Impression/Assessment:  Recurrent diverticulitis  Plan:  Proceed with cystoscopy with bilateral retrograde pyelogram, bilateral ureteral stent placement with open-ended ureteral catheters.  Marton Redwood, III 08/15/2017, 8:29 AM

## 2017-08-15 NOTE — H&P (Signed)
CC: Here today for surgery  HPI: Pamela Lara is a very pleasant 24yoF with hx of arthritis who presented to clinic for further evaluation regarding recurrent attacks of diverticulitis. She first experienced sharp, crampy LLQ abdominal pain in 03/2016. She presented to the emergency room for further evaluation and was found to have a WBC 14 and underwent CT which showed findings compatible with acute diverticulitis of the distal descending colon/proximal sigmoid colon. There was no evidence of abscess or frank perforation. She was subsequently discharged with antibiotics and improved. Since that attack she had 3 subsequent attacks- managed as an outpatient - each with sharp left lower quadrant pain, nausea and subsequent resolution after oral antibiotics. She had an attack 02/28/17 which brought her to the ED- subsequent CT abdomen and pelvis which showed acute uncomplicated diverticulitis at the junction of the descending/sigmoid colon. She was again treated with antibiotics with subsequent resolution. 2 months later she had a recurrent attack managed as an outpatient with antibiotics. She states the frequency of her attacks occur about every 2 months and are interfering with her quality of life.  Today, she denies any significant abdominal pain, fevers/chills/nausea/vomiting. Colonoscopy 04/19/17 Pamela Lara - small hyperplastic polyp in the rectum removed; small hyperplastic polyp tubular adenoma removed from the sigmoid colon. Diverticulosis of the sigmoid colon. Colonoscopy prior to this 03/2012 was only remarkable for hemorrhoids.  Past Medical History:  Diagnosis Date  . Arthritis    right shoulder  . BPPV (benign paroxysmal positional vertigo)   . Congenital bowed legs    s/p  surgical correction  . Diverticulitis, colon    recurrent  . GERD (gastroesophageal reflux disease)    watch diet (as needed takes apple cider vinager)  . History of colonic diverticulitis    hx multiple acute  diverticulitis episodes treated medically  . History of ectopic pregnancy   . History of Helicobacter pylori infection 2013  approx.  Marland Kitchen PONV (postoperative nausea and vomiting)   . Seasonal affective disorder [facility])     Past Surgical History:  Procedure Laterality Date  . CHOLECYSTECTOMY OPEN  1984  . COLONOSCOPY WITH PROPOFOL N/A 04/19/2017   Procedure: COLONOSCOPY WITH PROPOFOL;  Surgeon: Lucilla Lame, MD;  Location: [place];  Service: Endoscopy;  Laterality: N/A;  . EXCISIONAL SENTINAL LYMPH NODE BIOPSY, NECK  2016  approx.   per pt thyroid area , benign  . LAPAROSCOPY FOR ECTOPIC PREGNANCY  2001   also  BILATERAL TUBAL LIGATION  . OSTEOTOMY Bilateral 1980 and 1981   correction congenital bow legged  . TONSILLECTOMY      Family History  Problem Relation Age of Onset  . Cancer Mother   . Arthritis Mother   . Hypertension Mother   . Cancer Father 58       Mesothelioma  . Arthritis Father   . Hypertension Father   . Breast cancer Neg Hx     Social:  reports that she quit smoking about 6 weeks ago. Her smoking use included cigarettes. She has a 9.50 pack-year smoking history. She has never used smokeless tobacco. She reports that she drinks alcohol. She reports that she does not use drugs.  Allergies:  Allergies  Allergen Reactions  . Ivp Dye [Iodinated Diagnostic Agents] Shortness Of Breath  . Morphine And Related Nausea And Vomiting  . Other     General anesthesia makes her nauseated   . Wellbutrin [Bupropion] Other (See Comments)    Joint pain    Medications: I have reviewed  the patient's current medications.  No results found for this or any previous visit (from the past 48 hour(s)).  No results found.  ROS - all of the below systems have been reviewed with the patient and positives are indicated with bold text General: chills, fever or night sweats Eyes: blurry vision or double vision ENT: epistaxis or sore throat Allergy/Immunology:  itchy/watery eyes or nasal congestion Hematologic/Lymphatic: bleeding problems, blood clots or swollen lymph nodes Endocrine: temperature intolerance or unexpected weight changes Breast: new or changing breast lumps or nipple discharge Resp: cough, shortness of breath, or wheezing CV: chest pain or dyspnea on exertion GI: as per HPI GU: dysuria, trouble voiding, or hematuria MSK: joint pain or joint stiffness Neuro: TIA or stroke symptoms Derm: pruritus and skin lesion changes Psych: anxiety and depression  PE Blood pressure 114/74, pulse 74, temperature 98.4 F (36.9 C), temperature source Oral, resp. rate 18, last menstrual period 10/03/2015, SpO2 100 %. Constitutional: NAD; conversant; no deformities Eyes: Moist conjunctiva; no lid lag; anicteric; PERRL Neck: Trachea midline; no thyromegaly Lungs: Normal respiratory effort; no tactile fremitus CV: RRR; no palpable thrills; no pitting edema GI: Abd soft, NT/ND; no palpable hepatosplenomegaly MSK: Normal gait; no clubbing/cyanosis Psychiatric: Appropriate affect; alert and oriented x3 Lymphatic: No palpable cervical or axillary lymphadenopathy  A/P: Pamela Lara is a very pleasant 14yoF with hx of arthritis referred for evaluation of multiply recurrent attacks of acute uncomplicated sigmoid diverticulitis - at least 4 episodes in the past 14 months. This is now impacting her quality of life.  -The anatomy and physiology of the GI tract was discussed at length with the patient again today. The pathophysiology of diverticulitis and indications was discussed. -I worked to give her an understanding of the preoperative process, operative approaches and technical aspects of the procedure, potential need for stoma, expected postoperative course and recovery. We discussed the use of cystoscopy/stents-she has requested this as well -The planned procedure, material risks (including, but not limited to, pain, bleeding, infection, scarring, need  for blood transfusion, damage to surrounding structures- blood vessels/nerves/viscus/organs/bladder, damage to ureter, urine leak, leak from anastomosis, need for additional procedures, need for stoma which may be permanent, hernia, recurrence, pneumonia, heart attack, stroke, death) benefits and alternatives to surgery were discussed at length. I noted a good probability that the procedure would help improve her symptoms. Her questions were answered to her satisfaction, she expressed understanding and elected to proceed with surgery.  Pamela Lara, M.D. General and Colorectal Surgery [facility]

## 2017-08-15 NOTE — Transfer of Care (Signed)
Immediate Anesthesia Transfer of Care Note  Patient: Pamela Lara  Procedure(s) Performed: LAPAROSCOPIC VS OPEN SIGMOID COLECTOMY  ERAS PATHWAY (N/A Abdomen) CYSTOSCOPY WITH BILATERAL STENT AND FOLEY PLACEMENT (Bilateral Ureter)  Patient Location: PACU  Anesthesia Type:General  Level of Consciousness: awake, alert , oriented and patient cooperative  Airway & Oxygen Therapy: Patient Spontanous Breathing and Patient connected to face mask oxygen  Post-op Assessment: Report given to RN, Post -op Vital signs reviewed and stable and Patient moving all extremities  Post vital signs: Reviewed and stable  Last Vitals:  Vitals Value Taken Time  BP 126/81 08/15/2017 11:45 AM  Temp    Pulse 77 08/15/2017 11:46 AM  Resp 15 08/15/2017 11:46 AM  SpO2 100 % 08/15/2017 11:46 AM  Vitals shown include unvalidated device data.  Last Pain:  Vitals:   08/15/17 0720  TempSrc:   PainSc: 0-No pain      Patients Stated Pain Goal: 4 (90/24/09 7353)  Complications: No apparent anesthesia complications

## 2017-08-15 NOTE — Anesthesia Postprocedure Evaluation (Signed)
Anesthesia Post Note  Patient: Pamela Lara  Procedure(s) Performed: LAPAROSCOPIC VS OPEN SIGMOID COLECTOMY  ERAS PATHWAY (N/A Abdomen) CYSTOSCOPY WITH BILATERAL STENT AND FOLEY PLACEMENT (Bilateral Ureter)     Patient location during evaluation: PACU Anesthesia Type: General Level of consciousness: awake and alert Pain management: pain level controlled Vital Signs Assessment: post-procedure vital signs reviewed and stable Respiratory status: spontaneous breathing, nonlabored ventilation, respiratory function stable and patient connected to nasal cannula oxygen Cardiovascular status: blood pressure returned to baseline and stable Postop Assessment: no apparent nausea or vomiting Anesthetic complications: no    Last Vitals:  Vitals:   08/15/17 1315 08/15/17 1335  BP: 122/71 124/70  Pulse: 75 69  Resp: 10 16  Temp:  36.4 C  SpO2: 99% 100%    Last Pain:  Vitals:   08/15/17 1421  TempSrc:   PainSc: 4                  Porshia Blizzard DAVID

## 2017-08-15 NOTE — Progress Notes (Signed)
Dr Conrad Menasha informed pt w/ N/V in pacu. Order received to give phenergan 6.25.

## 2017-08-15 NOTE — Anesthesia Procedure Notes (Signed)
Procedure Name: Intubation Date/Time: 08/15/2017 8:47 AM Performed by: Mitzie Na, CRNA Pre-anesthesia Checklist: Patient identified, Emergency Drugs available, Suction available, Patient being monitored and Timeout performed Patient Re-evaluated:Patient Re-evaluated prior to induction Oxygen Delivery Method: Circle system utilized Preoxygenation: Pre-oxygenation with 100% oxygen Induction Type: IV induction, Rapid sequence and Cricoid Pressure applied Laryngoscope Size: Mac and 3 Grade View: Grade I Tube type: Oral Tube size: 7.0 mm Number of attempts: 1 Airway Equipment and Method: Stylet Placement Confirmation: ETT inserted through vocal cords under direct vision,  positive ETCO2 and breath sounds checked- equal and bilateral Secured at: 22 cm Tube secured with: Tape Dental Injury: Teeth and Oropharynx as per pre-operative assessment

## 2017-08-15 NOTE — Op Note (Signed)
Operative Note  Preoperative diagnosis:  1.  Recurrent diverticulitis  Post operative diagnosis: 1.  Recurrent diverticulitis Procedure(s): 1.  Cystoscopy with bilateral retrograde pyelogram and bilateral open-ended ureteral catheter placement  Surgeon: Link Snuffer, MD  Assistants: None  Anesthesia: General  Complications: None immediate  EBL: Minimal  Specimens: 1.  None  Drains/Catheters: 1.  Bilateral open-ended ureteral catheters  Intraoperative findings: 1.  Normal urethra and bladder 2.  Bilateral retrograde pyelogram revealed normal ureter and kidney without any filling defects Indication: 57 year old female with a history of recurrent diverticulitis presents for colectomy.  Intraoperative stent placement was requested.  Description of procedure:  The patient was identified and consent was obtained.  The patient was taken to the operating room and placed in the supine position.  The patient was placed under general anesthesia.  Perioperative antibiotics were administered.  The patient was placed in dorsal lithotomy.  Patient was prepped and draped in a standard sterile fashion and a timeout was performed.  A 21 French rigid cystoscope was advanced into the urethra and into the bladder.  The left distal most portion of the ureter was cannulated with an open-ended ureteral catheter.  Retrograde pyelogram was performed with the findings noted above.  A sensor wire was then advanced up to the kidney under fluoroscopic guidance.  The open-ended ureteral catheter was advanced over the wire up to the kidney and then the scope and wire were withdrawn keeping the open-ended ureteral catheter in place.   The right distal most portion of the ureter was cannulated with an open-ended ureteral catheter.  Retrograde pyelogram was performed with the findings noted above.  A sensor wire was then advanced up to the kidney under fluoroscopic guidance.  The open-ended ureteral catheter was  advanced over the wire up to the kidney and then the scope and wire were withdrawn keeping the open-ended ureteral catheter in place.Foley catheter was placed and both open-ended ureteral catheters were secured and threaded through into a drainage bag.  This concluded my portion of the operation.  Patient tolerated procedure well and the case was handed over to general surgery.  Plan: Per general surgery.  Urology will be available as needed.

## 2017-08-15 NOTE — Interval H&P Note (Signed)
History and Physical Interval Note:  08/15/2017 8:30 AM  Pamela Lara  has presented today for surgery, with the diagnosis of Recurrent diverticulitis  The various methods of treatment have been discussed with the patient and family. After consideration of risks, benefits and other options for treatment, the patient has consented to  Procedure(s): LAPAROSCOPIC VS Palmer (N/A) CYSTOSCOPY WITH BILATERAL STENT PLACEMENT (Bilateral) as a surgical intervention .  The patient's history has been reviewed, patient examined, no change in status, stable for surgery.  I have reviewed the patient's chart and labs.  Questions were answered to the patient's satisfaction.     Marton Redwood, III

## 2017-08-15 NOTE — Op Note (Addendum)
PATIENT: Pamela Lara  57 y.o. female  Patient Care Team: Crecencio Mc, MD as PCP - General (Internal Medicine) Serena Colonel, RN as [facility]  PREOP DIAGNOSIS: Recurrent diverticulitis  POSTOP DIAGNOSIS: Recurrent diverticulitis  PROCEDURE:  1. Laparoscopic segmental colectomy with double stapled colorectal anastomosis 2. Flexible sigmoidoscopy  SURGEON: Sharon Mt. Mack[place]ie Pilson, MD  ASSISTANT: Leighton Ruff, MD - An assist was necessary for this case for safe, complex retraction and exposure of critical structures.  ANESTHESIA: General endotracheal  EBL: 50cc Total I/O In: 1000 [I.V.:1000] Out: 250 [Urine:200; Blood:50]  DRAINS: None  SPECIMEN: Sigmoid colon, staple line marks distal margin  COUNTS: Sponge, needle and instrument counts were reported correct x2  FINDINGS: Proximal sigmoid/distal descending colon with evidence of prior attacks of diverticulitis. Double stapled colorectal anastomosis overlying sacral promontory, at a location where tenia splay and appendices epiploicae disappear.  Leak test was performed using flexible sigmoidoscopy and the anastomosis was pink, patent, hemostatic and airtight.  STATEMENT OF MEDICAL NECESSITY: [name] is a very pleasant 57yoF with hx of arthritis who presented to clinic for further evaluation regarding recurrent attacks of diverticulitis. She first experienced sharp, crampy LLQ abdominal pain in 03/2016. She presented to the emergency room for further evaluation and was found to have a WBC 14 and underwent CT which showed findings compatible with acute diverticulitis of the distal descending colon/proximal sigmoid colon. There was no evidence of abscess or frank perforation. She was subsequently discharged with antibiotics and improved. Since that attack she had 3 subsequent attacks- managed as an outpatient - each with sharp left lower quadrant pain, nausea and subsequent resolution  after oral antibiotics. She had an attack 02/28/17 which brought her to the ED- subsequent CT abdomen and pelvis which showed acute uncomplicated diverticulitis at the junction of the descending/sigmoid colon. She was again treated with antibiotics with subsequent resolution. 2 months later she had a recurrent attack managed as an outpatient with antibiotics. She states the frequency of her attacks occur about every 2 months and are interfering with her quality of life.  Colonoscopy 04/19/17 Dr. Allen Norris - small hyperplastic polyp in the rectum removed; small hyperplastic polyp tubular adenoma removed from the sigmoid colon. Diverticulosis of the sigmoid colon. Colonoscopy prior to this 03/2012 was only remarkable for hemorrhoids.  She was recommended laparoscopic vs open sigmoidectomy with cysto/stents. Please refer to H&P for details regarding this.  NARRATIVE: Informed consent was verified. The patient was taken to the operating room, placed supine on the operating table and SCD's were applied. General endotracheal anesthesia was induced. The patient was then positioned in the lithotomy position with Allen stirrups. Pressure points were rechecked and padded.  Dr. Gloriann Loan from urology then scrubbed; the patient was prepped and draped for placement of ureteral stents and cystoscopy.  Antibiotics had been administered.  Cystoscopy/stents was then performed - please refer to his dictation for details regarding this portion of the procedure.  Hair on the abdomen was then clipped. The patient's abdomen was then prepped and draped in the standard sterile fashion. Surgical timeout confirmed our plan.   Abdominal entry was gained using  a Veress needle in the left upper quadrant at Palmer's point.  Intraperitoneal location was confirmed with aspiration and the saline drop test.  The abdomen was then insufflated with CO2 to a maximum pressure of 15 mmHg.  Under direct visualization, an Optiview trocar was used to  access the left upper quadrant.  The laparoscope was then used  to inspect the peritoneal cavity and there is no evidence of trocar site complication. Three additional 49mm ports were carefully placed under direct laparoscopic visualization - one at the umbilicus and two in the right abdomen.   The patient was positioned in Trendelenburg. The omentum and small bowel was reflected cephalad out of the pelvis.  The area of diseased sigmoid was readily apparent at the descending/sigmoid junction.  A medial to lateral approach was then utilized.  The sigmoid colon was elevated and the proximal rectum identified.  The peritoneum at this level overlying the sacral promontory was then incised.  The avascular plane was gained.  Working under the superior hemorrhoidal artery and towards the IMA the avascular plane was developed cephalad.  The left ureter was readily identified and protected "down" working above this plane and cephalad.  The gonadal vessels were identified and also preserved free of injury.  The IMA was readily identified and circumferentially dissected after the ureter had been confirmed down.  The midportion of the IMA was then ligated using an Enseal device, again confirming location of the left ureter before dividing any tissue.  The IMA stump was then inspected and hemostasis was verified.  Continuing with the medial to lateral approach, additional mobilization along the left colon continued cephalad.  Following this, the descending colon up to the level of the splenic flexure was mobilized along the Tommie Bohlken line of Toldt.  Her descending colon was quite redundant and easily reached into the pelvis without any tension.  It did so without retracting.  The IMA pedicle was again revisualized and noted to be hemostatic.   Attention was then turned to extracorporeal portion of the procedure.  A Pfannenstiel incision was made approximately 2 finger breadths above the pubic symphysis and carried down to the  anterior rectus fascia.  The pyramidalis muscle was divided.  The anterior rectus fascia was then opened transversely.  Kocher clamps were placed on the fascia and the fibroconnective attachments to the anterior rectus fascia were taken down with electrocautery.  Hemostasis was verified.  The midline peritoneum was then incised and the peritoneal cavity entered.  Care was taken to protect the bladder from injury during opening of the peritoneum.  An [place] wound protector was then placed.  The sigmoid colon was grasped and eviscerated from the peritoneal cavity.  The proximal and distal sites of transection were identified.  The proximal site of transection being at the level of the descending colon, well above the diseased segment.  The distal point of transection being at the proximal rectum.  This corresponded to the location where the tenia splayed, the epiploica appendages terminated and was overlying the sacral promontory.  The distal site of transection was done using a contoured stapler, blue load.  A pursestring device was then applied to the descending colon and a 2-0 Prolene passed through the device using a Keith needle.  Just distal to this, the colon was divided and the sigmoid colon was passed off as specimen with staple line marking distal margin. EEA sizers were then passed in the descending colon and a 49mm Covidien EEA was selected. The anvil was placed and the pursestring tied.  Both ends were then prepared for anastomosis.  The cath was placed on the wound protector and the abdomen reinsufflated with CO2.   Under direct visualization, a 28 mm EEA stapler was carefully passed up the rectum under.  The spike was deployed just anterior to the staple line.  The components were mated  and orientation of the proximal limb of colon confirmed there was no twisting of the colon nor mesentery and all small bowel in appropriate position in the right abdomen. The stapler was then closed and held. Care was  taken to ensure no other structures were included in the staple line.  The stapler was then fired.  We then turned our attention to performing a leak test.  The colon proximal to the anastomosis was occluded and a flexible sigmoidoscopy was then performed. The pelvis was filled with sterile saline. Air insufflation via  flexible sigmoidoscopy revealed no evidence of air leak.  Both ends of the bowel at the level of the anastomosis were pink and it was widely patent and hemostatic.  The irrigation was evacuated.  Hemostasis was verified in the abdomen. The omentum was brought back down into the pelvis.  The Alexis wound protector and 5 mm ports were all removed.  Attention was then turned to closing the abdomen.  The abdomen was redraped with sterile drapes.  All equipment was exchanged for clean equipment.  Gloves and gowns of all scrubbed participants were changed.  The wounds were then irrigated with sterile saline. Counts were reported correct x2. The peritoneum was closed with a running 2-0 Vicryl suture.  Care was taken to again avoid any injury to the bladder.  The rectus fascia was then approximated with running #1 PDS suture.  The skin of all incision sites was closed with 4-0 Monocryl subcuticular sutures.  Dermabond was applied to all incisions. The ureteral stents at the end of the case were then removed and the Foley catheter left in place.  DISPOSITION: PACU in satisfactory condition  Note: This dictation was prepared with Dragon/digital dictation along with [employer]. Any transcriptional errors that result from this process are unintentional.

## 2017-08-15 NOTE — Anesthesia Preprocedure Evaluation (Signed)
Anesthesia Evaluation  Patient identified by MRN, date of birth, ID band Patient awake    Reviewed: Allergy & Precautions, NPO status , Patient's Chart, lab work & pertinent test results  History of Anesthesia Complications (+) PONV  Airway Mallampati: I  TM Distance: >3 FB Neck ROM: Full    Dental   Pulmonary former smoker,    Pulmonary exam normal        Cardiovascular Normal cardiovascular exam     Neuro/Psych Depression    GI/Hepatic GERD  Poorly Controlled,  Endo/Other    Renal/GU      Musculoskeletal   Abdominal   Peds  Hematology   Anesthesia Other Findings   Reproductive/Obstetrics                             Anesthesia Physical Anesthesia Plan  ASA: II  Anesthesia Plan: General   Post-op Pain Management:    Induction: Intravenous, Rapid sequence and Cricoid pressure planned  PONV Risk Score and Plan: 4 or greater and Ondansetron, Dexamethasone and Midazolam  Airway Management Planned: Oral ETT  Additional Equipment:   Intra-op Plan:   Post-operative Plan: Extubation in OR  Informed Consent: I have reviewed the patients History and Physical, chart, labs and discussed the procedure including the risks, benefits and alternatives for the proposed anesthesia with the patient or authorized representative who has indicated his/her understanding and acceptance.     Plan Discussed with: CRNA and Surgeon  Anesthesia Plan Comments:         Anesthesia Quick Evaluation

## 2017-08-16 ENCOUNTER — Encounter (HOSPITAL_COMMUNITY): Payer: Self-pay | Admitting: Surgery

## 2017-08-16 LAB — PHOSPHORUS: Phosphorus: 3.4 mg/dL (ref 2.5–4.6)

## 2017-08-16 LAB — BASIC METABOLIC PANEL
ANION GAP: 11 (ref 5–15)
BUN: 5 mg/dL — AB (ref 6–20)
CALCIUM: 8.8 mg/dL — AB (ref 8.9–10.3)
CO2: 22 mmol/L (ref 22–32)
Chloride: 103 mmol/L (ref 101–111)
Creatinine, Ser: 0.71 mg/dL (ref 0.44–1.00)
GFR calc Af Amer: >60 mL/min (ref >60)
GFR calc non Af Amer: >60 mL/min (ref >60)
GLUCOSE: 116 mg/dL — AB (ref 65–99)
Potassium: 3.9 mmol/L (ref 3.5–5.1)
Sodium: 136 mmol/L (ref 135–145)

## 2017-08-16 LAB — CBC
HEMATOCRIT: 36.3 % (ref 36.0–46.0)
Hemoglobin: 12.5 g/dL (ref 12.0–15.0)
MCH: 30.3 pg (ref 26.0–34.0)
MCHC: 34.4 g/dL (ref 30.0–36.0)
MCV: 87.9 fL (ref 78.0–100.0)
Platelets: 223 10*3/uL (ref 150–400)
RBC: 4.13 MIL/uL (ref 3.87–5.11)
RDW: 12.5 % (ref 11.5–15.5)
WBC: 17.6 10*3/uL — AB (ref 4.0–10.5)

## 2017-08-16 LAB — MAGNESIUM: Magnesium: 1.5 mg/dL — ABNORMAL LOW (ref 1.7–2.4)

## 2017-08-16 MED ORDER — TRAMADOL HCL 50 MG PO TABS: 50.0000 mg | ORAL_TABLET | Freq: Four times a day (QID) | ORAL | 0 refills | Status: AC | PRN

## 2017-08-16 MED ORDER — ENSURE ENLIVE PO LIQD
237.0000 mL | Freq: Two times a day (BID) | ORAL | Status: DC
  Administered 2017-08-16: 237 mL via ORAL

## 2017-08-16 MED ORDER — MAGNESIUM SULFATE 4 GM/100ML IV SOLN
4.0000 g | Freq: Once | INTRAVENOUS | Status: AC
Start: 2017-08-16 — End: 2017-08-16
  Administered 2017-08-16: 4 g via INTRAVENOUS
  Filled 2017-08-16: qty 100

## 2017-08-16 NOTE — Consult Note (Signed)
   [facility] CM Inpatient Consult   08/16/2017  Pamela Lara 20-Apr-1961 [id number]    Came to visit Pamela Lara at bedside on behalf of [place] [employer] for [facility] employees/dependents with [facility] insurance.   She denies having any [facility] Care Management needs. Aware of post hospital discharge call.   Provided 24-hr nurse advice line magnet and contact information.   Appreciative of visit.    Marthenia Rolling, MSN-Ed, RN,BSN [facility] Liaison [phone]

## 2017-08-16 NOTE — Discharge Instructions (Signed)
POST OP INSTRUCTIONS  1. DIET: As tolerated. Be sure to include lots of fluids daily.  Avoid fast food or heavy meals as your are more likely to get nauseated.  2. Take your usually prescribed home medications unless otherwise directed.  3. PAIN CONTROL: a. Pain is best controlled by a usual combination of three different methods TOGETHER: i. Ice/Heat ii. Over the counter pain medication iii. Prescription pain medication b. Most patients will experience some swelling and bruising around the surgical site.  Ice packs or heating pads (30-60 minutes up to 6 times a day) will help. Some people prefer to use ice alone, heat alone, alternating between ice & heat.  Experiment to what works for you.  Swelling and bruising can take several weeks to resolve.   c. It is helpful to take an over-the-counter pain medication regularly for the first few weeks: i. Ibuprofen (Motrin/Advil) - 200mg  tabs - take 3 tabs (600mg ) every 6 hours as needed for pain ii. Acetaminophen (Tylenol) - you may take 650mg  every 6 hours as needed. You can take this with motrin as they act differently on the body. If you are taking a narcotic pain medication that has acetaminophen in it, do not take over the counter tylenol at the same time.  Iii. NOTE: You may take both of these medications together - most patients  find it most helpful when alternating between the two (i.e. Ibuprofen at 6am,  tylenol at 9am, ibuprofen at 12pm ...) d. A  prescription for pain medication should be given to you upon discharge.  Take your pain medication as prescribed if your pain is not adequatly controlled with the over-the-counter pain reliefs mentioned above.  4. Avoid getting constipated.  Between the surgery and the pain medications, it is common to experience some constipation.  Increasing fluid intake and taking a fiber supplement (such as Metamucil, Citrucel, FiberCon, MiraLax, etc) 1-2 times a day regularly will usually help prevent this  problem from occurring.  A mild laxative (prune juice, Milk of Magnesia, MiraLax, etc) should be taken according to package directions if there are no bowel movements after 48 hours.    5. Dressing: Your incisions are covered in Dermabond which is like sterile superglue for the skin. This will come off on it's own in a couple weeks. It is waterproof and you may bathe normally starting the day after your surgery in a shower. Avoid baths/pools/lakes/oceans until your wounds have fully healed.  6. ACTIVITIES as tolerated:   a. Avoid heavy lifting (>10lbs or 1 gallon of milk) for the next 6 weeks. b. You may resume regular (light) daily activities beginning the next day--such as daily self-care, walking, climbing stairs--gradually increasing activities as tolerated.  If you can walk 30 minutes without difficulty, it is safe to try more intense activity such as jogging, treadmill, bicycling, low-impact aerobics.  c. DO NOT PUSH THROUGH PAIN.  Let pain be your guide: If it hurts to do something, don't do it. d. Dennis Bast may drive when you are no longer taking prescription pain medication, you can comfortably wear a seatbelt, and you can safely maneuver your car and apply brakes. e. Dennis Bast may have sexual intercourse when it is comfortable.   7. FOLLOW UP in our office a. Please call [facility] at [phone] to set up an appointment to see your surgeon in the office for a follow-up appointment approximately 2 weeks after your surgery. b. Make sure that you call for this appointment the day you arrive  home to insure a convenient appointment time.  9. If you have disability or family leave forms that need to be completed, you may have them completed by your primary care physician's office; for return to work instructions, please ask our office staff and they will be happy to assist you in obtaining this documentation   When to call us [phone]: 1. Poor pain control 2. Reactions / problems with new medications  (rash/itching, etc)  3. Fever over 101.5 F (38.5 C) 4. Inability to urinate 5. Nausea/vomiting 6. Worsening swelling or bruising 7. Continued bleeding from incision. 8. Increased pain, redness, or drainage from the incision  The clinic staff is available to answer your questions during regular business hours (8:30am-5pm).  Please dont hesitate to call and ask to speak to one of our nurses for clinical concerns.   A surgeon from [facility] is always on call at the hospitals   If you have a medical emergency, go to the nearest emergency room or call 911.  [facility], [place] [address], [place], [place], [place]  [address] MAIN: [phone] FAX: [phone] [email or web address]

## 2017-08-16 NOTE — Progress Notes (Addendum)
Subjective No acute events. Feeling well. Abdominal pain well controlled with tylenol and ibuprofen alone. Denies n/v. Ambulating. Tolerating clear liquids without issue. +Flatus; no substantial BM as of yet.  Objective: Vital signs in last 24 hours: Temp:  [97.6 F (36.4 C)-98.9 F (37.2 C)] 97.9 F (36.6 C) (04/18 0549) Pulse Rate:  [66-86] 66 (04/18 0549) Resp:  [10-18] 18 (04/18 0549) BP: (111-130)/(56-81) 119/65 (04/18 0549) SpO2:  [95 %-100 %] 97 % (04/18 0549) Weight:  [81.3 kg (179 lb 3.7 oz)] 81.3 kg (179 lb 3.7 oz) (04/18 0405)    Intake/Output from previous day: 04/17 0701 - 04/18 0700 In: 3387.5 [P.O.:1060; I.V.:2277.5; IV Piggyback:50] Out: 1950 [Urine:1900; Blood:50] Intake/Output this shift: No intake/output data recorded.  Gen: NAD, comfortable CV: RRR Pulm: Normal work of breathing Abd: Soft, NT/ND; incisions with dermabond, c/d/i without erythema Ext: SCDs in place GU: Urine clear in foley bag  Lab Results: CBC  Recent Labs    08/15/17 1403 08/16/17 0444  WBC 14.3* 17.6*  HGB 14.2 12.5  HCT 40.6 36.3  PLT 215 223   BMET Recent Labs    08/15/17 1403 08/16/17 0444  NA  --  136  K  --  3.9  CL  --  103  CO2  --  22  GLUCOSE  --  116*  BUN  --  5*  CREATININE 0.98 0.71  CALCIUM  --  8.8*   Anti-infectives: Anti-infectives (From admission, onward)   Start     Dose/Rate Route Frequency Ordered Stop   08/15/17 0822  cefoTEtan in Dextrose 5% (CEFOTAN) 2-2.08 GM-%(50ML) IVPB    Note to Pharmacy:  Caryl Pina   : cabinet override      08/15/17 0822 08/15/17 0849   08/15/17 0648  cefoTEtan in Dextrose 5% (CEFOTAN) IVPB 2 g     2 g Intravenous On call to O.R. 08/15/17 6160 08/15/17 0849   08/15/17 0648  neomycin (MYCIFRADIN) tablet 1,000 mg  Status:  Discontinued     1,000 mg Oral 3 times per day 08/15/17 0648 08/15/17 1325   08/15/17 0648  metroNIDAZOLE (FLAGYL) tablet 1,000 mg  Status:  Discontinued     1,000 mg Oral 3 times per day  08/15/17 0648 08/15/17 1325       Assessment/Plan: Patient Active Problem List   Diagnosis Date Noted  . Diverticulitis 08/15/2017  . Colon cancer screening   . Polyp of sigmoid colon   . Rectal polyp   . Hepatic steatosis 08/12/2016  . Diverticulitis of colon without hemorrhage 04/11/2016  . Elevated ALT measurement 05/16/2015  . Left thyroid nodule 04/30/2014  . Cervical cancer screening 04/05/2013  . GERD (gastroesophageal reflux disease) 04/24/2012  . External hemorrhoid 04/24/2012  . Encounter for preventive health examination 03/29/2012  . Tobacco abuse 12/21/2010   s/p Procedure(s): LAPAROSCOPIC SIGMOID COLECTOMY  ERAS PATHWAY CYSTOSCOPY WITH BILATERAL STENT AND FOLEY PLACEMENT 08/15/2017  POD#1 -Advance to full liquids; continue MIVF until reliably tolerating fulls; decrease rate to 50/hr -Hypomagnesemia - replace with 4g IV today -Continue Entereg until BM -Ambulate 5x/day; IS 10x/hr while awake -D/C foley today -Trend labs -If tolerates fulls, soft diet tomorrow and possible discharge later in the day if doing well, labs normalizing, having BMs and tolerating diet   LOS: 1 day   Sharon Mt. Dema Severin, M.D. [facility]  Note: This dictation was prepared with Dragon/digital dictation along with [employer]. Any transcriptional errors that result from this process are unintentional.

## 2017-08-17 LAB — BASIC METABOLIC PANEL WITH GFR
Anion gap: 9 (ref 5–15)
BUN: 8 mg/dL (ref 6–20)
CO2: 24 mmol/L (ref 22–32)
Calcium: 8.7 mg/dL — ABNORMAL LOW (ref 8.9–10.3)
Chloride: 104 mmol/L (ref 101–111)
Creatinine, Ser: 0.72 mg/dL (ref 0.44–1.00)
GFR calc Af Amer: >60 mL/min
GFR calc non Af Amer: >60 mL/min
Glucose, Bld: 111 mg/dL — ABNORMAL HIGH (ref 65–99)
Potassium: 3.7 mmol/L (ref 3.5–5.1)
Sodium: 137 mmol/L (ref 135–145)

## 2017-08-17 LAB — CBC
HCT: 37.5 % (ref 36.0–46.0)
Hemoglobin: 12.9 g/dL (ref 12.0–15.0)
MCH: 30.6 pg (ref 26.0–34.0)
MCHC: 34.4 g/dL (ref 30.0–36.0)
MCV: 88.9 fL (ref 78.0–100.0)
PLATELETS: 214 10*3/uL (ref 150–400)
RBC: 4.22 MIL/uL (ref 3.87–5.11)
RDW: 12.7 % (ref 11.5–15.5)
WBC: 9.8 10*3/uL (ref 4.0–10.5)

## 2017-08-17 LAB — PHOSPHORUS: PHOSPHORUS: 2.4 mg/dL — AB (ref 2.5–4.6)

## 2017-08-17 LAB — MAGNESIUM: Magnesium: 2.2 mg/dL (ref 1.7–2.4)

## 2017-08-17 MED ORDER — TRAMADOL HCL 50 MG PO TABS: 50.0000 mg | ORAL_TABLET | Freq: Four times a day (QID) | ORAL | 1 refills | Status: DC | PRN

## 2017-08-17 MED FILL — traMADol HCL 50 MG TABS: 50 | 5 days supply | Qty: 20 | Fill #0

## 2017-08-17 NOTE — Progress Notes (Signed)
2 Days Post-Op   Subjective/Chief Complaint: Feels good. Ready to go home   Objective: Vital signs in last 24 hours: Temp:  [98.4 F (36.9 C)-99.1 F (37.3 C)] 98.4 F (36.9 C) (04/19 0609) Pulse Rate:  [61-74] 61 (04/19 0609) Resp:  [16-18] 16 (04/19 0609) BP: (114-121)/(63-77) 120/77 (04/19 0609) SpO2:  [96 %] 96 % (04/19 0609) Weight:  [81 kg (178 lb 9.2 oz)] 81 kg (178 lb 9.2 oz) (04/19 0500)    Intake/Output from previous day: 04/18 0701 - 04/19 0700 In: 1944.6 [P.O.:1440; I.V.:504.6] Out: 6901 [Urine:6900; Stool:1] Intake/Output this shift: Total I/O In: -  Out: 300 [Urine:300]  General appearance: alert and cooperative Resp: clear to auscultation bilaterally Cardio: regular rate and rhythm GI: soft, minimal tenderness. good bs. incisions look good  Lab Results:  Recent Labs    08/16/17 0444 08/17/17 0501  WBC 17.6* 9.8  HGB 12.5 12.9  HCT 36.3 37.5  PLT 223 214   BMET Recent Labs    08/16/17 0444 08/17/17 0501  NA 136 137  K 3.9 3.7  CL 103 104  CO2 22 24  GLUCOSE 116* 111*  BUN 5* 8  CREATININE 0.71 0.72  CALCIUM 8.8* 8.7*   PT/INR No results for input(s): LABPROT, INR in the last 72 hours. ABG No results for input(s): PHART, HCO3 in the last 72 hours.  Invalid input(s): PCO2, PO2  Studies/Results: No results found.  Anti-infectives: Anti-infectives (From admission, onward)   Start     Dose/Rate Route Frequency Ordered Stop   08/15/17 0822  cefoTEtan in Dextrose 5% (CEFOTAN) 2-2.08 GM-%(50ML) IVPB    Note to Pharmacy:  Caryl Pina   : cabinet override      08/15/17 0822 08/15/17 0849   08/15/17 0648  cefoTEtan in Dextrose 5% (CEFOTAN) IVPB 2 g     2 g Intravenous On call to O.R. 08/15/17 5027 08/15/17 0849   08/15/17 0648  neomycin (MYCIFRADIN) tablet 1,000 mg  Status:  Discontinued     1,000 mg Oral 3 times per day 08/15/17 0648 08/15/17 1325   08/15/17 0648  metroNIDAZOLE (FLAGYL) tablet 1,000 mg  Status:  Discontinued     1,000 mg Oral 3 times per day 08/15/17 0648 08/15/17 1325      Assessment/Plan: s/p Procedure(s): LAPAROSCOPIC SIGMOID COLECTOMY  ERAS PATHWAY (N/A) CYSTOSCOPY WITH  FOLEY PLACEMENT WITH BILATERAL open ended catheter placement.  catheters removed at the end of the case. (Bilateral) Advance diet Discharge  LOS: 2 days    TOTH III,Myangel Summons S 08/17/2017

## 2017-08-17 NOTE — Progress Notes (Signed)
Pharmacy Brief Note - Alvimopan (Entereg)  The standing order set for alvimopan (Entereg) now includes an automatic order to discontinue the drug after the patient has had a bowel movement. The change was approved by the [place] and the Medical Executive Committee.   This patient has had bowel movements documented by nursing. Therefore, alvimopan has been discontinued. If there are questions, please contact the pharmacy at [phone].   Thank you-  Dia Sitter, PharmD, BCPS 08/17/2017 11:12 AM

## 2017-08-17 NOTE — Progress Notes (Signed)
Discharge instructions discussed with patient and family, verbalized agreement and understanding 

## 2017-08-20 ENCOUNTER — Other Ambulatory Visit: Payer: Self-pay | Admitting: *Deleted

## 2017-08-20 ENCOUNTER — Encounter: Payer: Self-pay | Admitting: *Deleted

## 2017-08-20 NOTE — Patient Outreach (Signed)
[facility]) Care Management  08/20/2017  Pamela Lara 12-29-1960 [id number]   Initial attempt to reach [name] to complete transition of care assessment. [name] was hospitalized from 4/17-4/19 at [facility] for laparoscopic sigmoid colectomy for recurrent diverticulitis and cystoscopy with bilateral retrograde pyelogram and bilateral open ended ureteral catheter placement .She was discharged home on 4/19 and did not require home health or durable medial equipment.  Message left on mobile number requesting return call.  Per transition of care protocol, will mail unsuccessful outreach letter and attempt another outreach within 3-4 business days.  Barrington Ellison RN,CCM,CDE [place] Management Coordinator Office Phone [phone] Office Fax [phone]

## 2017-08-21 ENCOUNTER — Other Ambulatory Visit: Payer: Self-pay | Admitting: *Deleted

## 2017-08-21 NOTE — Patient Outreach (Signed)
Pamela Lara) Care Management  08/21/2017  Pamela Lara [birth year] [id number]  Pamela Lara returned call th this RNCM and post hospital discharge transition of care call completed. See transition of care template for details. Pamela Lara was hospitalized at [facility] from 4/17-4/19 for laparoscopic sigmoid colectomy for recurrent diverticulitis and cystoscopy with bilateral retrograde pyelogram and bilateral open ended ureteral catheter placement for surgical identification. The stents were removed after the sigmoid colectomy was completed. She was discharged home on 4/19 and did not require home health or durable medial equipment.  No care management needs were identified so will close case to [place] [facility] Management services. Patient asked that a successful outreach letter not be mailed to her because she received the information and a 24 hours nurse line magnet from [employer] the [place] [facility] hospital  liaison nurse.  Barrington Ellison RN,CCM,CDE [place] Management Coordinator Office Phone [phone] Office Fax [phone]

## 2017-08-23 NOTE — Discharge Summary (Addendum)
Patient ID: Pamela Lara MRN: [id number] DOB/AGE: 57-Jun-1962 57 y.o.  Admit date: 08/15/2017 Discharge date: 08/17/2017  Discharge Diagnoses Patient Active Problem List   Diagnosis Date Noted  . Diverticulitis 08/15/2017  . Colon cancer screening   . Polyp of sigmoid colon   . Rectal polyp   . Hepatic steatosis 08/12/2016  . Diverticulitis of colon without hemorrhage 04/11/2016  . Elevated ALT measurement 05/16/2015  . Left thyroid nodule 04/30/2014  . Cervical cancer screening 04/05/2013  . GERD (gastroesophageal reflux disease) 04/24/2012  . External hemorrhoid 04/24/2012  . Encounter for preventive health examination 03/29/2012  . Tobacco abuse 12/21/2010    Consultants None  Procedures Laparoscopic sigmoidectomy, flexible sigmoidoscopy; with cysto/stents (urology)  Hospital Course: The patient was admitted to the hospital following her surgery for monitoring. She did well postoperatively. She tolerated liquids on POD#0 and was advanced to full liquids the following day which she tolerated. On POD#2, she was having bowel movements and gas, tolerating a soft diet without n/v, pain controlled on oral analgesics and was deemed stable for discharge home.   Allergies as of 08/17/2017      Reactions   Ivp Dye [iodinated Diagnostic Agents] Shortness Of Breath   Morphine And Related Nausea And Vomiting   Other    General anesthesia makes her nauseated with vomiting    Wellbutrin [bupropion] Other (See Comments)   Joint pain      Medication List    STOP taking these medications   metroNIDAZOLE 500 MG tablet Commonly known as:  FLAGYL   neomycin 500 MG tablet Commonly known as:  MYCIFRADIN     TAKE these medications   calcium carbonate 500 MG chewable tablet Commonly known as:  TUMS - dosed in mg elemental calcium Chew 1 tablet by mouth as needed for indigestion or heartburn.   FIBER PO Take 625 mg by mouth daily.   Fish Oil 1000 MG Caps Take 1,000 mg by  mouth daily.   [place] Ginseng 1000 MG Tabs Take 1,000 mg by mouth daily.   polyethylene glycol powder powder Commonly known as:  GLYCOLAX/MIRALAX Take 0.5 Containers by mouth once.   traMADol 50 MG tablet Commonly known as:  ULTRAM Take 1-2 tablets (50-100 mg total) by mouth every 6 (six) hours as needed.   varenicline 0.5 MG X 11 & 1 MG X 42 tablet Commonly known as:  CHANTIX STARTING MONTH PAK Take one 0.5 mg tablet by mouth once daily for 3 days, then increase to one 0.5 mg tablet twice daily for 4 days, then increase to one 1 mg tablet twice daily.   varenicline 1 MG tablet Commonly known as:  CHANTIX CONTINUING MONTH PAK Take 1 tablet (1 mg total) by mouth 2 (two) times daily. KEEP ON FILE FOR 2ND MONTH/FUTURE REFILL   Vitamin D3 1000 units Caps Take 1,000 Units by mouth daily.     ASK your doctor about these medications   traMADol 50 MG tablet Commonly known as:  ULTRAM Take 1 tablet (50 mg total) by mouth every 6 (six) hours as needed for up to 5 days (pain not controlled with ibuprofen and tylenol). Ask about: Should I take this medication?        Follow-up Information    Ileana Roup, MD Follow up in 2 week(s).   Specialty:  General Surgery Contact information: [place] [address] [phone]           Jakhiya Brower M. Dema Severin, M.D. [facility]

## 2017-09-18 ENCOUNTER — Other Ambulatory Visit (HOSPITAL_COMMUNITY)
Admission: RE | Admit: 2017-09-18 | Discharge: 2017-09-18 | Disposition: A | Discharge status: Home / Self Care | Payer: 59 | Source: Ambulatory Visit | Attending: Internal Medicine | Admitting: Internal Medicine

## 2017-09-18 ENCOUNTER — Encounter: Payer: Self-pay | Admitting: Internal Medicine

## 2017-09-18 ENCOUNTER — Ambulatory Visit (INDEPENDENT_AMBULATORY_CARE_PROVIDER_SITE_OTHER): Payer: 59 | Admitting: Internal Medicine

## 2017-09-18 VITALS — BP 106/78 | HR 74 | Temp 98.3°F | Resp 15 | Ht 64.5 in | Wt 171.8 lb

## 2017-09-18 DIAGNOSIS — R928 Other abnormal and inconclusive findings on diagnostic imaging of breast: Secondary | ICD-10-CM

## 2017-09-18 DIAGNOSIS — Z124 Encounter for screening for malignant neoplasm of cervix: Secondary | ICD-10-CM

## 2017-09-18 DIAGNOSIS — Z87891 Personal history of nicotine dependence: Secondary | ICD-10-CM

## 2017-09-18 DIAGNOSIS — Z0000 Encounter for general adult medical examination without abnormal findings: Secondary | ICD-10-CM

## 2017-09-18 DIAGNOSIS — Z9049 Acquired absence of other specified parts of digestive tract: Secondary | ICD-10-CM | POA: Diagnosis not present

## 2017-09-18 DIAGNOSIS — E785 Hyperlipidemia, unspecified: Secondary | ICD-10-CM | POA: Diagnosis not present

## 2017-09-18 DIAGNOSIS — R740 Nonspecific elevation of levels of transaminase and lactic acid dehydrogenase [LDH]: Secondary | ICD-10-CM

## 2017-09-18 DIAGNOSIS — R7401 Elevation of levels of liver transaminase levels: Secondary | ICD-10-CM

## 2017-09-18 NOTE — Patient Instructions (Signed)
Congratulations on quitting smoking!     Health Maintenance for Postmenopausal Women Menopause is a normal process in which your reproductive ability comes to an end. This process happens gradually over a span of months to years, usually between the ages of 49 and 25. Menopause is complete when you have missed 12 consecutive menstrual periods. It is important to talk with your health care provider about some of the most common conditions that affect postmenopausal women, such as heart disease, cancer, and bone loss (osteoporosis). Adopting a healthy lifestyle and getting preventive care can help to promote your health and wellness. Those actions can also lower your chances of developing some of these common conditions. What should I know about menopause? During menopause, you may experience a number of symptoms, such as:  Moderate-to-severe hot flashes.  Night sweats.  Decrease in sex drive.  Mood swings.  Headaches.  Tiredness.  Irritability.  Memory problems.  Insomnia.  Choosing to treat or not to treat menopausal changes is an individual decision that you make with your health care provider. What should I know about hormone replacement therapy and supplements? Hormone therapy products are effective for treating symptoms that are associated with menopause, such as hot flashes and night sweats. Hormone replacement carries certain risks, especially as you become older. If you are thinking about using estrogen or estrogen with progestin treatments, discuss the benefits and risks with your health care provider. What should I know about heart disease and stroke? Heart disease, heart attack, and stroke become more likely as you age. This may be due, in part, to the hormonal changes that your body experiences during menopause. These can affect how your body processes dietary fats, triglycerides, and cholesterol. Heart attack and stroke are both medical emergencies. There are many things  that you can do to help prevent heart disease and stroke:  Have your blood pressure checked at least every 1-2 years. High blood pressure causes heart disease and increases the risk of stroke.  If you are 36-33 years old, ask your health care provider if you should take aspirin to prevent a heart attack or a stroke.  Do not use any tobacco products, including cigarettes, chewing tobacco, or electronic cigarettes. If you need help quitting, ask your health care provider.  It is important to eat a healthy diet and maintain a healthy weight. ? Be sure to include plenty of vegetables, fruits, low-fat dairy products, and lean protein. ? Avoid eating foods that are high in solid fats, added sugars, or salt (sodium).  Get regular exercise. This is one of the most important things that you can do for your health. ? Try to exercise for at least 150 minutes each week. The type of exercise that you do should increase your heart rate and make you sweat. This is known as moderate-intensity exercise. ? Try to do strengthening exercises at least twice each week. Do these in addition to the moderate-intensity exercise.  Know your numbers.Ask your health care provider to check your cholesterol and your blood glucose. Continue to have your blood tested as directed by your health care provider.  What should I know about cancer screening? There are several types of cancer. Take the following steps to reduce your risk and to catch any cancer development as early as possible. Breast Cancer  Practice breast self-awareness. ? This means understanding how your breasts normally appear and feel. ? It also means doing regular breast self-exams. Let your health care provider know about any changes, no  matter how small.  If you are 68 or older, have a clinician do a breast exam (clinical breast exam or CBE) every year. Depending on your age, family history, and medical history, it may be recommended that you also have  a yearly breast X-ray (mammogram).  If you have a family history of breast cancer, talk with your health care provider about genetic screening.  If you are at high risk for breast cancer, talk with your health care provider about having an MRI and a mammogram every year.  Breast cancer (BRCA) gene test is recommended for women who have family members with BRCA-related cancers. Results of the assessment will determine the need for genetic counseling and BRCA1 and for BRCA2 testing. BRCA-related cancers include these types: ? Breast. This occurs in males or females. ? Ovarian. ? Tubal. This may also be called fallopian tube cancer. ? Cancer of the abdominal or pelvic lining (peritoneal cancer). ? Prostate. ? Pancreatic.  Cervical, Uterine, and Ovarian Cancer Your health care provider may recommend that you be screened regularly for cancer of the pelvic organs. These include your ovaries, uterus, and vagina. This screening involves a pelvic exam, which includes checking for microscopic changes to the surface of your cervix (Pap test).  For women ages 21-65, health care providers may recommend a pelvic exam and a Pap test every three years. For women ages 20-65, they may recommend the Pap test and pelvic exam, combined with testing for human papilloma virus (HPV), every five years. Some types of HPV increase your risk of cervical cancer. Testing for HPV may also be done on women of any age who have unclear Pap test results.  Other health care providers may not recommend any screening for nonpregnant women who are considered low risk for pelvic cancer and have no symptoms. Ask your health care provider if a screening pelvic exam is right for you.  If you have had past treatment for cervical cancer or a condition that could lead to cancer, you need Pap tests and screening for cancer for at least 20 years after your treatment. If Pap tests have been discontinued for you, your risk factors (such as  having a new sexual partner) need to be reassessed to determine if you should start having screenings again. Some women have medical problems that increase the chance of getting cervical cancer. In these cases, your health care provider may recommend that you have screening and Pap tests more often.  If you have a family history of uterine cancer or ovarian cancer, talk with your health care provider about genetic screening.  If you have vaginal bleeding after reaching menopause, tell your health care provider.  There are currently no reliable tests available to screen for ovarian cancer.  Lung Cancer Lung cancer screening is recommended for adults 6-32 years old who are at high risk for lung cancer because of a history of smoking. A yearly low-dose CT scan of the lungs is recommended if you:  Currently smoke.  Have a history of at least 30 pack-years of smoking and you currently smoke or have quit within the past 15 years. A pack-year is smoking an average of one pack of cigarettes per day for one year.  Yearly screening should:  Continue until it has been 15 years since you quit.  Stop if you develop a health problem that would prevent you from having lung cancer treatment.  Colorectal Cancer  This type of cancer can be detected and can often be prevented.  Routine colorectal cancer screening usually begins at age 64 and continues through age 42.  If you have risk factors for colon cancer, your health care provider may recommend that you be screened at an earlier age.  If you have a family history of colorectal cancer, talk with your health care provider about genetic screening.  Your health care provider may also recommend using home test kits to check for hidden blood in your stool.  A small camera at the end of a tube can be used to examine your colon directly (sigmoidoscopy or colonoscopy). This is done to check for the earliest forms of colorectal cancer.  Direct  examination of the colon should be repeated every 5-10 years until age 57. However, if early forms of precancerous polyps or small growths are found or if you have a family history or genetic risk for colorectal cancer, you may need to be screened more often.  Skin Cancer  Check your skin from head to toe regularly.  Monitor any moles. Be sure to tell your health care provider: ? About any new moles or changes in moles, especially if there is a change in a mole's shape or color. ? If you have a mole that is larger than the size of a pencil eraser.  If any of your family members has a history of skin cancer, especially at a young age, talk with your health care provider about genetic screening.  Always use sunscreen. Apply sunscreen liberally and repeatedly throughout the day.  Whenever you are outside, protect yourself by wearing long sleeves, pants, a wide-brimmed hat, and sunglasses.  What should I know about osteoporosis? Osteoporosis is a condition in which bone destruction happens more quickly than new bone creation. After menopause, you may be at an increased risk for osteoporosis. To help prevent osteoporosis or the bone fractures that can happen because of osteoporosis, the following is recommended:  If you are 9-75 years old, get at least 1,000 mg of calcium and at least 600 mg of vitamin D per day.  If you are older than age 61 but younger than age 61, get at least 1,200 mg of calcium and at least 600 mg of vitamin D per day.  If you are older than age 13, get at least 1,200 mg of calcium and at least 800 mg of vitamin D per day.  Smoking and excessive alcohol intake increase the risk of osteoporosis. Eat foods that are rich in calcium and vitamin D, and do weight-bearing exercises several times each week as directed by your health care provider. What should I know about how menopause affects my mental health? Depression may occur at any age, but it is more common as you become  older. Common symptoms of depression include:  Low or sad mood.  Changes in sleep patterns.  Changes in appetite or eating patterns.  Feeling an overall lack of motivation or enjoyment of activities that you previously enjoyed.  Frequent crying spells.  Talk with your health care provider if you think that you are experiencing depression. What should I know about immunizations? It is important that you get and maintain your immunizations. These include:  Tetanus, diphtheria, and pertussis (Tdap) booster vaccine.  Influenza every year before the flu season begins.  Pneumonia vaccine.  Shingles vaccine.  Your health care provider may also recommend other immunizations. This information is not intended to replace advice given to you by your health care provider. Make sure you discuss any questions you have with  your health care provider. Document Released: 06/09/2005 Document Revised: 11/05/2015 Document Reviewed: 01/19/2015 Elsevier Interactive Patient Education  2018 [employer].

## 2017-09-18 NOTE — Progress Notes (Signed)
Patient ID: Pamela Lara, female    DOB: 1960/05/14  Age: 57 y.o. MRN: [id number]  The patient is here for annual preventive examination and management of other chronic and acute problems.   OVERDUE FOR PAP LAST ONE 2014 COLONOSCOPY DEC 2018 5 YR FOLLOW UP MAMMOGRAM DIAGNOSTIC AND RIGHT BREAST ULTRASOUND  DUE June 1 SIGMOID COLECTOY DONE during admission to [facility] on April 17  Discharged April 25  TOBACCO ABUSE quit in February using altoids  Lab Results  Component Value Date   CHOL 260 (H) 09/18/2017   HDL 47.10 09/18/2017   LDLCALC 129 (H) 08/04/2016   LDLDIRECT 159.0 09/18/2017   TRIG (H) 09/18/2017    427.0 Triglyceride is over 400; calculations on Lipids are invalid.   CHOLHDL 6 09/18/2017     The risk factors are reflected in the social history.  The roster of all physicians providing medical care to patient - is listed in the Snapshot section of the chart.  Activities of daily living:  The patient is 100% independent in all ADLs: dressing, toileting, feeding as well as independent mobility  Home safety : The patient has smoke detectors in the home. They wear seatbelts.  There are no firearms at home. There is no violence in the home.   There is no risks for hepatitis, STDs or HIV. There is no   history of blood transfusion. They have no travel history to infectious disease endemic areas of the world.  The patient has seen their dentist in the last six month. They have seen their eye doctor in the last year. They admit to slight hearing difficulty with regard to whispered voices and some television programs.  They have deferred audiologic testing in the last year.  They do not  have excessive sun exposure. Discussed the need for sun protection: hats, long sleeves and use of sunscreen if there is significant sun exposure.   Diet: the importance of a healthy diet is discussed. They do have a healthy diet.  The benefits of regular aerobic exercise were discussed. She walks 4  times per week ,  20 minutes.   Depression screen: there are no signs or vegative symptoms of depression- irritability, change in appetite, anhedonia, sadness/tearfullness.  The following portions of the patient's history were reviewed and updated as appropriate: allergies, current medications, past family history, past medical history,  past surgical history, past social history  and problem list.  Visual acuity was not assessed per patient preference since she has regular follow up with her ophthalmologist. Hearing and body mass index were assessed and reviewed.   During the course of the visit the patient was educated and counseled about appropriate screening and preventive services including : fall prevention , diabetes screening, nutrition counseling, colorectal cancer screening, and recommended immunizations.    CC: The primary encounter diagnosis was Hypocalcemia. Diagnoses of Encounter for preventive health examination, Abnormal finding on radiological examination of breast, Cervical cancer screening, Hyperlipidemia LDL goal <100, Elevated ALT measurement, History of tobacco abuse, and S/P partial colectomy were also pertinent to this visit.  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   She has a past surgical history that includes Colonoscopy with propofol (N/A, 04/19/2017); Cholecystectomy open (1984); Tonsillectomy; Laparoscopy for ectopic pregnancy (2001);  Osteotomy (Bilateral, 1980 and 1981); EXCISIONAL SENTINAL LYMPH NODE BIOPSY, NECK (N/A, 2016  approx.); Laparoscopic sigmoid colectomy (N/A, 08/15/2017); and Cystoscopy with stent placement  (Bilateral, 08/15/2017).   Her family history includes Arthritis in her father and mother; Cancer in her mother; Cancer (age of onset: 56) in her father; Hypertension in her father and mother.She reports that she quit smoking about 2 months ago. Her smoking use included cigarettes. She has a 9.50 pack-year smoking history. She has never used smokeless tobacco. She reports that she drinks alcohol. She reports that she does not use drugs.  Outpatient Medications Prior to Visit  Medication Sig Dispense Refill  . calcium carbonate (TUMS - DOSED IN MG ELEMENTAL CALCIUM) 500 MG chewable tablet Chew 1 tablet by mouth as needed for indigestion or heartburn.    . Cholecalciferol (VITAMIN D3) 1000 UNITS CAPS Take 1,000 Units by mouth daily.     Marland Kitchen FIBER PO Take 625 mg by mouth daily.     Coralyn Pear Ginseng 1000 MG TABS Take 1,000 mg by mouth daily.     . Omega-3 Fatty Acids (FISH OIL) 1000 MG CAPS Take 1,000 mg by mouth daily.     . Psyllium (METAMUCIL FIBER PO) Take by mouth.    . polyethylene glycol powder (GLYCOLAX/MIRALAX) powder Take 0.5 Containers by mouth once.    . traMADol (ULTRAM) 50 MG tablet Take 1-2 tablets (50-100 mg total) by mouth every 6 (six) hours as needed. (Patient not taking: Reported on 09/18/2017) 20 tablet 1  . varenicline (CHANTIX CONTINUING MONTH PAK) 1 MG tablet Take 1 tablet (1 mg total) by mouth 2 (two) times daily. KEEP ON FILE FOR 2ND MONTH/FUTURE REFILL (Patient not taking: Reported on 07/24/2017) 60 tablet 2  . varenicline (CHANTIX STARTING MONTH PAK) 0.5 MG X 11 & 1 MG X 42 tablet Take one 0.5 mg tablet by mouth once daily for 3 days, then increase to one 0.5 mg tablet twice daily for 4 days, then increase to one 1 mg tablet twice daily. (Patient not taking: Reported on 07/24/2017) 53 tablet 0   No facility-administered medications prior to visit.     Review of Systems   Patient denies headache, fevers, malaise, unintentional weight loss, skin rash, eye pain, sinus congestion  and sinus pain, sore throat, dysphagia,  hemoptysis , cough, dyspnea, wheezing, chest pain, palpitations, orthopnea, edema, abdominal pain, nausea, melena, diarrhea, constipation, flank pain, dysuria, hematuria, urinary  Frequency, nocturia, numbness, tingling, seizures,  Focal weakness, Loss of consciousness,  Tremor, insomnia, depression, anxiety, and suicidal ideation.     Objective:  BP 106/78 (BP Location: Left Arm, Patient Position: Sitting, Cuff Size: Normal)   Pulse 74   Temp 98.3 F (36.8 C) (Oral)   Resp 15   Ht 5' 4.5" (1.638 m)   Wt 171 lb 12.8 oz (77.9 kg)   LMP 10/03/2015 (Approximate)   SpO2 97%   BMI 29.03 kg/m   Physical Exam   General Appearance:    Alert, cooperative, no distress, appears stated age  Head:    Normocephalic, without obvious abnormality, atraumatic  Eyes:    PERRL, conjunctiva/corneas clear, EOM's intact, fundi    benign, both eyes  Ears:    Normal TM's and external ear canals, both ears  Nose:   Nares normal, septum midline, mucosa normal, no drainage    or sinus tenderness  Throat:   Lips, mucosa, and tongue normal; teeth and gums normal  Neck:   Supple, symmetrical, trachea midline, no adenopathy;  thyroid:  no enlargement/tenderness/nodules; no carotid   bruit or JVD  Back:     Symmetric, no curvature, ROM normal, no CVA tenderness  Lungs:     Clear to auscultation bilaterally, respirations unlabored  Chest Wall:    No tenderness or deformity   Heart:    Regular rate and rhythm, S1 and S2 normal, no murmur, rub   or gallop  Breast Exam:    No tenderness, masses, or nipple abnormality  Abdomen:     Soft, non-tender, bowel sounds active all four quadrants,    Well healing horizontal surgical scar suprapubic area no masses, no organomegaly  Genitalia:    Pelvic: cervix normal in appearance, external genitalia normal, no adnexal masses or tenderness, no cervical motion tenderness, rectovaginal septum normal, uterus normal size, shape, and  consistency and vagina normal without discharge  Extremities:   Extremities normal, atraumatic, no cyanosis or edema  Pulses:   2+ and symmetric all extremities  Skin:   Skin color, texture, turgor normal, no rashes or lesions  Lymph nodes:   Cervical, supraclavicular, and axillary nodes normal  Neurologic:   CNII-XII intact, normal strength, sensation and reflexes    throughout   Assessment & Plan:   Problem List Items Addressed This Visit    Cervical cancer screening   Relevant Orders   Cytology - PAP   S/P partial colectomy   History of tobacco abuse    Smoking cessation instruction/counseling given: commended patient for quitting  And encouraged  Patient to continue  abstinence      Encounter for preventive health examination    Annual comprehensive preventive exam was done as well as an evaluation and management of chronic conditions .  During the course of the visit the patient was educated and counseled about appropriate screening and preventive services including :  diabetes screening, lipid analysis with projected  10 year  risk for CAD , nutrition counseling, breast, cervical and colorectal cancer screening, and recommended immunizations.  Printed recommendations for health maintenance screenings was given      Elevated ALT measurement    Resolved. On repeat testing   Lab Results  Component Value Date   ALT 38 07/27/2017   AST 25 07/27/2017   ALKPHOS 61 07/27/2017   BILITOT 0.7 07/27/2017          Other Visit Diagnoses    Hypocalcemia    -  Primary   Relevant Orders   Renal function panel (Completed)   Abnormal finding on radiological examination of breast       Relevant Orders   MM Digital Diagnostic Bilat   US BREAST LTD UNI RIGHT INC AXILLA   Hyperlipidemia LDL goal <100       Relevant Orders   Lipid panel (Completed)      I have discontinued Delorus N. Proch's varenicline, varenicline, polyethylene glycol powder, and traMADol. I am also having her  maintain her Vitamin D3, Fish Oil, Korean Ginseng, FIBER PO, calcium carbonate, and Psyllium (METAMUCIL FIBER PO).  No orders of the defined types were placed in this encounter.   Medications Discontinued During This Encounter  Medication Reason  . polyethylene glycol powder (GLYCOLAX/MIRALAX) powder Completed Course  . traMADol (ULTRAM) 50 MG tablet Completed Course  . varenicline (CHANTIX CONTINUING MONTH PAK) 1 MG tablet Prescription never filled  . varenicline (CHANTIX STARTING MONTH PAK) 0.5 MG X 11 & 1 MG X 42 tablet Prescription never filled    Follow-up: Return in about 6 months (around 03/21/2018).  Crecencio Mc, MD

## 2017-09-19 ENCOUNTER — Encounter: Payer: Self-pay | Admitting: Internal Medicine

## 2017-09-19 LAB — RENAL FUNCTION PANEL
ALBUMIN: 4.4 g/dL (ref 3.5–5.2)
BUN: 11 mg/dL (ref 6–23)
CALCIUM: 9.6 mg/dL (ref 8.4–10.5)
CO2: 25 meq/L (ref 19–32)
CREATININE: 0.83 mg/dL (ref 0.40–1.20)
Chloride: 103 mEq/L (ref 96–112)
GFR: 75.43 mL/min (ref >60.00)
Glucose, Bld: 96 mg/dL (ref 70–99)
Phosphorus: 4.4 mg/dL (ref 2.3–4.6)
Potassium: 3.9 mEq/L (ref 3.5–5.1)
Sodium: 137 mEq/L (ref 135–145)

## 2017-09-19 LAB — LIPID PANEL
Cholesterol: 260 mg/dL — ABNORMAL HIGH (ref 0–200)
HDL: 47.1 mg/dL (ref >39.00)
Total CHOL/HDL Ratio: 6
Triglycerides: 427 mg/dL — ABNORMAL HIGH (ref 0.0–149.0)

## 2017-09-19 LAB — LDL CHOLESTEROL, DIRECT: Direct LDL: 159 mg/dL

## 2017-09-19 NOTE — Assessment & Plan Note (Signed)
Resolved. On repeat testing   Lab Results  Component Value Date   ALT 38 07/27/2017   AST 25 07/27/2017   ALKPHOS 61 07/27/2017   BILITOT 0.7 07/27/2017

## 2017-09-19 NOTE — Assessment & Plan Note (Signed)
Smoking cessation instruction/counseling given: commended patient for quitting  And encouraged  Patient to continue  abstinence

## 2017-09-19 NOTE — Assessment & Plan Note (Signed)
Annual comprehensive preventive exam was done as well as an evaluation and management of chronic conditions .  During the course of the visit the patient was educated and counseled about appropriate screening and preventive services including :  diabetes screening, lipid analysis with projected  10 year  risk for CAD , nutrition counseling, breast, cervical and colorectal cancer screening, and recommended immunizations.  Printed recommendations for health maintenance screenings was given 

## 2017-09-28 LAB — CYTOLOGY - PAP
Diagnosis: NEGATIVE
HPV: NOT DETECTED

## 2017-10-03 DIAGNOSIS — H5203 Hypermetropia, bilateral: Secondary | ICD-10-CM | POA: Diagnosis not present

## 2017-10-09 ENCOUNTER — Other Ambulatory Visit: Payer: Self-pay

## 2017-10-17 ENCOUNTER — Ambulatory Visit
Admission: RE | Admit: 2017-10-17 | Discharge: 2017-10-17 | Disposition: A | Discharge status: Home / Self Care | Payer: 59 | Source: Ambulatory Visit | Attending: Internal Medicine | Admitting: Internal Medicine

## 2017-10-17 DIAGNOSIS — R928 Other abnormal and inconclusive findings on diagnostic imaging of breast: Secondary | ICD-10-CM | POA: Diagnosis not present

## 2017-10-17 DIAGNOSIS — N6313 Unspecified lump in the right breast, lower outer quadrant: Secondary | ICD-10-CM | POA: Diagnosis not present

## 2017-10-17 DIAGNOSIS — R922 Inconclusive mammogram: Secondary | ICD-10-CM | POA: Diagnosis not present

## 2018-01-31 ENCOUNTER — Encounter: Payer: Self-pay | Admitting: Internal Medicine

## 2018-01-31 ENCOUNTER — Ambulatory Visit (INDEPENDENT_AMBULATORY_CARE_PROVIDER_SITE_OTHER): Payer: 59 | Admitting: Internal Medicine

## 2018-01-31 VITALS — BP 118/70 | HR 72 | Temp 98.2°F | Wt 178.0 lb

## 2018-01-31 DIAGNOSIS — B9789 Other viral agents as the cause of diseases classified elsewhere: Secondary | ICD-10-CM | POA: Diagnosis not present

## 2018-01-31 DIAGNOSIS — J069 Acute upper respiratory infection, unspecified: Secondary | ICD-10-CM

## 2018-01-31 NOTE — Patient Instructions (Signed)
Upper Respiratory Infection, Adult Most upper respiratory infections (URIs) are caused by a virus. A URI affects the nose, throat, and upper air passages. The most common type of URI is often called "the common cold." Follow these instructions at home:  Take medicines only as told by your doctor.  Gargle warm saltwater or take cough drops to comfort your throat as told by your doctor.  Use a warm mist humidifier or inhale steam from a shower to increase air moisture. This may make it easier to breathe.  Drink enough fluid to keep your pee (urine) clear or pale yellow.  Eat soups and other clear broths.  Have a healthy diet.  Rest as needed.  Go back to work when your fever is gone or your doctor says it is okay. ? You may need to stay home longer to avoid giving your URI to others. ? You can also wear a face mask and wash your hands often to prevent spread of the virus.  Use your inhaler more if you have asthma.  Do not use any tobacco products, including cigarettes, chewing tobacco, or electronic cigarettes. If you need help quitting, ask your doctor. Contact a doctor if:  You are getting worse, not better.  Your symptoms are not helped by medicine.  You have chills.  You are getting more short of breath.  You have brown or red mucus.  You have yellow or brown discharge from your nose.  You have pain in your face, especially when you bend forward.  You have a fever.  You have puffy (swollen) neck glands.  You have pain while swallowing.  You have white areas in the back of your throat. Get help right away if:  You have very bad or constant: ? Headache. ? Ear pain. ? Pain in your forehead, behind your eyes, and over your cheekbones (sinus pain). ? Chest pain.  You have long-lasting (chronic) lung disease and any of the following: ? Wheezing. ? Long-lasting cough. ? Coughing up blood. ? A change in your usual mucus.  You have a stiff neck.  You have  changes in your: ? Vision. ? Hearing. ? Thinking. ? Mood. This information is not intended to replace advice given to you by your health care provider. Make sure you discuss any questions you have with your health care provider. Document Released: 10/04/2007 Document Revised: 12/19/2015 Document Reviewed: 07/23/2013 Elsevier Interactive Patient Education  2018 [employer]  

## 2018-01-31 NOTE — Progress Notes (Signed)
HPI  Pt presents to the clinic today with c/o cough, chest congestion and shortness of breath. She reports this started yesterday. The cough is non productive. She reports associated headache but denies runny nose, nasal congestion, ear pain or sore throat. She has had a low grade fever, chills and body aches. She has tried Tylenol and Ibuprofen with some relief. She has no history of allergies or breathing problems. She has not had sick contacts.  Review of Systems      Past Medical History:  Diagnosis Date  . Arthritis    right shoulder  . BPPV (benign paroxysmal positional vertigo)   . Congenital bowed legs    s/p  surgical correction  . Diverticulitis, colon    recurrent  . GERD (gastroesophageal reflux disease)    watch diet (as needed takes apple cider vinager)  . History of colonic diverticulitis    hx multiple acute diverticulitis episodes treated medically  . History of ectopic pregnancy   . History of Helicobacter pylori infection 2013  approx.  Marland Kitchen PONV (postoperative nausea and vomiting)   . Seasonal affective disorder ([place])     Family History  Problem Relation Age of Onset  . Cancer Mother   . Arthritis Mother   . Hypertension Mother   . Cancer Father 61       Mesothelioma  . Arthritis Father   . Hypertension Father   . Breast cancer Neg Hx     Social History   Socioeconomic History  . Marital status: Married    Spouse name: Not on file  . Number of children: Not on file  . Years of education: Not on file  . Highest education level: Not on file  Occupational History  . Not on file  Social Needs  . Financial resource strain: Not on file  . Food insecurity:    Worry: Not on file    Inability: Not on file  . Transportation needs:    Medical: Not on file    Non-medical: Not on file  Tobacco Use  . Smoking status: Former Smoker    Packs/day: 0.25    Years: 38.00    Pack years: 9.50    Types: Cigarettes    Last attempt to quit: 06/28/2017    Years  since quitting: 0.5  . Smokeless tobacco: Never Used  . Tobacco comment: since age 28/  07-27-2017 per pt quit smoking 02/ 28/ 2019 cold [place]  Substance and Sexual Activity  . Alcohol use: Yes    Comment: occasional  . Drug use: No  . Sexual activity: Not on file  Lifestyle  . Physical activity:    Days per week: Not on file    Minutes per session: Not on file  . Stress: Not on file  Relationships  . Social connections:    Talks on phone: Not on file    Gets together: Not on file    Attends religious service: Not on file    Active member of club or organization: Not on file    Attends meetings of clubs or organizations: Not on file    Relationship status: Not on file  . Intimate partner violence:    Fear of current or ex partner: Not on file    Emotionally abused: Not on file    Physically abused: Not on file    Forced sexual activity: Not on file  Other Topics Concern  . Not on file  Social History Narrative  . Not on file  Allergies  Allergen Reactions  . Ivp Dye [Iodinated Diagnostic Agents] Shortness Of Breath  . Morphine And Related Nausea And Vomiting  . Other     General anesthesia makes her nauseated with vomiting    . Wellbutrin [Bupropion] Other (See Comments)    Joint pain     Constitutional: Positive headache, fatigue and fever. Denies abrupt weight changes.  HEENT:  Denies eye redness, eye pain, pressure behind the eyes, facial pain, nasal congestion, ear pain, ringing in the ears, wax buildup, runny nose or sore throat. Respiratory: Positive cough and shortness of breath. Denies difficulty breathing.  Cardiovascular: Denies chest pain, chest tightness, palpitations or swelling in the hands or feet.   No other specific complaints in a complete review of systems (except as listed in HPI above).  Objective:   BP 118/70   Pulse 72   Temp 98.2 F (36.8 C) (Oral)   Wt 178 lb (80.7 kg)   LMP 10/03/2015 (Approximate)   SpO2 97%   BMI 30.08 kg/m   Wt Readings from Last 3 Encounters:  01/31/18 178 lb (80.7 kg)  09/18/17 171 lb 12.8 oz (77.9 kg)  08/17/17 178 lb 9.2 oz (81 kg)     General: Appears her stated age, well developed, well nourished in NAD. HEENT: Head: normal shape and size, no sinus tenderness noted; Ears: Tm's gray and intact, normal light reflex; Nose: mucosa pink and moist, septum midline; Throat/Mouth: + PND. Teeth present, mucosa erythematous and moist, no exudate noted, no lesions or ulcerations noted.  Neck: No cervical lymphadenopathy.  Cardiovascular: Normal rate and rhythm. S1,S2 noted.  No murmur, rubs or gallops noted.  Pulmonary/Chest: Normal effort and positive vesicular breath sounds. No respiratory distress. No wheezes, rales or ronchi noted.       Assessment & Plan:   Viral Upper Respiratory Infection with Cough:  Get some rest and drink plenty of water Tylenol/Ibuprofen for fever, body aches Delsym as needed for cough  RTC as needed or if symptoms persist.   Webb Silversmith, NP

## 2018-02-07 ENCOUNTER — Ambulatory Visit (INDEPENDENT_AMBULATORY_CARE_PROVIDER_SITE_OTHER): Payer: Self-pay | Admitting: Family

## 2018-02-07 DIAGNOSIS — Z23 Encounter for immunization: Secondary | ICD-10-CM

## 2018-02-07 NOTE — Patient Instructions (Signed)

## 2018-02-07 NOTE — Progress Notes (Signed)
Pt presents here today for visit to receive Influenza shot vaccine. Allergies reviewed, vaccine given left deltoid IM, vaccine information statement provided, tolerated well.  Vassie Loll, [place] Registered Medical Assistant [place] [phone]

## 2018-03-15 ENCOUNTER — Other Ambulatory Visit: Payer: Self-pay | Admitting: Internal Medicine

## 2018-03-15 DIAGNOSIS — E781 Pure hyperglyceridemia: Secondary | ICD-10-CM

## 2018-03-27 ENCOUNTER — Ambulatory Visit: Payer: Self-pay | Admitting: Internal Medicine

## 2018-04-03 ENCOUNTER — Other Ambulatory Visit (INDEPENDENT_AMBULATORY_CARE_PROVIDER_SITE_OTHER): Payer: 59

## 2018-04-03 DIAGNOSIS — E781 Pure hyperglyceridemia: Secondary | ICD-10-CM | POA: Diagnosis not present

## 2018-04-03 LAB — LIPID PANEL
CHOLESTEROL: 209 mg/dL — AB (ref 0–200)
HDL: 49.3 mg/dL (ref >39.00)
NonHDL: 159.67
TRIGLYCERIDES: 260 mg/dL — AB (ref 0.0–149.0)
Total CHOL/HDL Ratio: 4
VLDL: 52 mg/dL — ABNORMAL HIGH (ref 0.0–40.0)

## 2018-04-03 LAB — COMPREHENSIVE METABOLIC PANEL
ALBUMIN: 4.3 g/dL (ref 3.5–5.2)
ALK PHOS: 60 U/L (ref 39–117)
ALT: 57 U/L — AB (ref 0–35)
AST: 26 U/L (ref 0–37)
BUN: 11 mg/dL (ref 6–23)
CALCIUM: 9.4 mg/dL (ref 8.4–10.5)
CO2: 23 mEq/L (ref 19–32)
CREATININE: 0.7 mg/dL (ref 0.40–1.20)
Chloride: 107 mEq/L (ref 96–112)
GFR: 91.64 mL/min (ref >60.00)
Glucose, Bld: 105 mg/dL — ABNORMAL HIGH (ref 70–99)
Potassium: 4.1 mEq/L (ref 3.5–5.1)
SODIUM: 138 meq/L (ref 135–145)
Total Bilirubin: 0.7 mg/dL (ref 0.2–1.2)
Total Protein: 7.3 g/dL (ref 6.0–8.3)

## 2018-04-03 LAB — LDL CHOLESTEROL, DIRECT: Direct LDL: 135 mg/dL

## 2018-04-05 ENCOUNTER — Other Ambulatory Visit: Payer: Self-pay | Admitting: Internal Medicine

## 2018-04-05 DIAGNOSIS — R7401 Elevation of levels of liver transaminase levels: Secondary | ICD-10-CM

## 2018-04-05 DIAGNOSIS — R740 Nonspecific elevation of levels of transaminase and lactic acid dehydrogenase [LDH]: Principal | ICD-10-CM

## 2018-04-08 ENCOUNTER — Encounter: Payer: Self-pay | Admitting: Internal Medicine

## 2018-04-08 ENCOUNTER — Ambulatory Visit (INDEPENDENT_AMBULATORY_CARE_PROVIDER_SITE_OTHER): Payer: 59 | Admitting: Internal Medicine

## 2018-04-08 VITALS — BP 116/68 | HR 78 | Temp 98.1°F | Resp 14 | Ht 64.5 in | Wt 182.0 lb

## 2018-04-08 DIAGNOSIS — R740 Nonspecific elevation of levels of transaminase and lactic acid dehydrogenase [LDH]: Secondary | ICD-10-CM | POA: Diagnosis not present

## 2018-04-08 DIAGNOSIS — R7301 Impaired fasting glucose: Secondary | ICD-10-CM

## 2018-04-08 DIAGNOSIS — D0361 Melanoma in situ of right upper limb, including shoulder: Secondary | ICD-10-CM | POA: Diagnosis not present

## 2018-04-08 DIAGNOSIS — Z1159 Encounter for screening for other viral diseases: Secondary | ICD-10-CM | POA: Diagnosis not present

## 2018-04-08 DIAGNOSIS — Z1283 Encounter for screening for malignant neoplasm of skin: Secondary | ICD-10-CM | POA: Diagnosis not present

## 2018-04-08 DIAGNOSIS — D1800 Hemangioma unspecified site: Secondary | ICD-10-CM | POA: Diagnosis not present

## 2018-04-08 DIAGNOSIS — D2260 Melanocytic nevi of unspecified upper limb, including shoulder: Secondary | ICD-10-CM | POA: Diagnosis not present

## 2018-04-08 DIAGNOSIS — D485 Neoplasm of uncertain behavior of skin: Secondary | ICD-10-CM | POA: Diagnosis not present

## 2018-04-08 DIAGNOSIS — D225 Melanocytic nevi of trunk: Secondary | ICD-10-CM | POA: Diagnosis not present

## 2018-04-08 DIAGNOSIS — R635 Abnormal weight gain: Secondary | ICD-10-CM | POA: Diagnosis not present

## 2018-04-08 DIAGNOSIS — Z23 Encounter for immunization: Secondary | ICD-10-CM | POA: Diagnosis not present

## 2018-04-08 DIAGNOSIS — L812 Freckles: Secondary | ICD-10-CM | POA: Diagnosis not present

## 2018-04-08 DIAGNOSIS — L719 Rosacea, unspecified: Secondary | ICD-10-CM | POA: Diagnosis not present

## 2018-04-08 DIAGNOSIS — D2270 Melanocytic nevi of unspecified lower limb, including hip: Secondary | ICD-10-CM | POA: Diagnosis not present

## 2018-04-08 DIAGNOSIS — L578 Other skin changes due to chronic exposure to nonionizing radiation: Secondary | ICD-10-CM | POA: Diagnosis not present

## 2018-04-08 DIAGNOSIS — R7401 Elevation of levels of liver transaminase levels: Secondary | ICD-10-CM

## 2018-04-08 NOTE — Progress Notes (Signed)
Subjective:  Patient ID: Pamela Lara, female    DOB: 14-Mar-1961  Age: 57 y.o. MRN: [id number]  CC: The primary encounter diagnosis was Elevated ALT measurement. Diagnoses of Need for hepatitis B screening test, Need for hepatitis A vaccination, Impaired fasting glucose, and Weight gain were also pertinent to this visit.  HPI Pamela Lara presents for FOLLOW UP ON ABNORMAL ALT.   She has a history of fatty liver , presumed based on negative screening serologies for other causes,  And a CT scan which suggested the diagnosis  Last year.  She recalls having an episode of  Hepatitis while  In  college, but acute hepatitis panel was negative in 2017.  She denies excessive alcohol use but does drink more beer in the summertime.  She   Has gained 15 lbs since April when she lost weight after her  partial colectomy  For recurrent diverticulitis  She denies pruritus , nausea,  Dark colored urine. Does not use tylenol on a daily basis.   Lab Results  Component Value Date   ALT 57 (H) 04/03/2018   AST 26 04/03/2018   ALKPHOS 60 04/03/2018   BILITOT 0.7 04/03/2018   Lab Results  Component Value Date   TSH 1.02 08/04/2016    Lab Results  Component Value Date   HGBA1C 4.7 (L) 07/27/2017     Outpatient Medications Prior to Visit  Medication Sig Dispense Refill  . calcium carbonate (TUMS - DOSED IN MG ELEMENTAL CALCIUM) 500 MG chewable tablet Chew 1 tablet by mouth as needed for indigestion or heartburn.    . Cholecalciferol (VITAMIN D3) 1000 UNITS CAPS Take 1,000 Units by mouth daily.     Marland Kitchen FIBER PO Take 625 mg by mouth daily.     Coralyn Pear Ginseng 1000 MG TABS Take 1,000 mg by mouth daily.     . Omega-3 Fatty Acids (FISH OIL) 1000 MG CAPS Take 1,000 mg by mouth daily.     . Psyllium (METAMUCIL FIBER PO) Take by mouth.     No facility-administered medications prior to visit.     Review of Systems;  Patient denies headache, fevers, malaise, unintentional weight loss, skin  rash, eye pain, sinus congestion and sinus pain, sore throat, dysphagia,  hemoptysis , cough, dyspnea, wheezing, chest pain, palpitations, orthopnea, edema, abdominal pain, nausea, melena, diarrhea, constipation, flank pain, dysuria, hematuria, urinary  Frequency, nocturia, numbness, tingling, seizures,  Focal weakness, Loss of consciousness,  Tremor, insomnia, depression, anxiety, and suicidal ideation.      Objective:  BP 116/68 (BP Location: Left Arm, Patient Position: Sitting, Cuff Size: Large)   Pulse 78   Temp 98.1 F (36.7 C) (Oral)   Resp 14   Ht 5' 4.5" (1.638 m)   Wt 182 lb (82.6 kg)   LMP 10/03/2015 (Approximate)   SpO2 97%   BMI 30.76 kg/m   BP Readings from Last 3 Encounters:  04/08/18 116/68  01/31/18 118/70  09/18/17 106/78    Wt Readings from Last 3 Encounters:  04/08/18 182 lb (82.6 kg)  01/31/18 178 lb (80.7 kg)  09/18/17 171 lb 12.8 oz (77.9 kg)    General appearance: alert, cooperative and appears stated age Ears: normal TM's and external ear canals both ears Throat: lips, mucosa, and tongue normal; teeth and gums normal Neck: no adenopathy, no carotid bruit, supple, symmetrical, trachea midline and thyroid not enlarged, symmetric, no tenderness/mass/nodules Back: symmetric, no curvature. ROM normal. No CVA tenderness. Lungs: clear to auscultation bilaterally Heart:  regular rate and rhythm, S1, S2 normal, no murmur, click, rub or gallop Abdomen: soft, non-tender; bowel sounds normal; no masses,  no organomegaly Pulses: 2+ and symmetric Skin: Skin color, texture, turgor normal. No rashes or lesions Lymph nodes: Cervical, supraclavicular, and axillary nodes normal.  Lab Results  Component Value Date   HGBA1C 4.7 (L) 07/27/2017    Lab Results  Component Value Date   CREATININE 0.70 04/03/2018   CREATININE 0.83 09/18/2017   CREATININE 0.72 08/17/2017    Lab Results  Component Value Date   WBC 9.8 08/17/2017   HGB 12.9 08/17/2017   HCT 37.5  08/17/2017   PLT 214 08/17/2017   GLUCOSE 105 (H) 04/03/2018   CHOL 209 (H) 04/03/2018   TRIG 260.0 (H) 04/03/2018   HDL 49.30 04/03/2018   LDLDIRECT 135.0 04/03/2018   LDLCALC 129 (H) 08/04/2016   ALT 57 (H) 04/03/2018   AST 26 04/03/2018   NA 138 04/03/2018   K 4.1 04/03/2018   CL 107 04/03/2018   CREATININE 0.70 04/03/2018   BUN 11 04/03/2018   CO2 23 04/03/2018   TSH 1.02 08/04/2016   INR 1.01 04/02/2013   HGBA1C 4.7 (L) 07/27/2017    US Breast [place] Axilla  Result Date: 10/17/2017 CLINICAL DATA:  Follow-up of probably benign right breast 8 o'clock mass, first identified in May 2018. EXAM: DIGITAL DIAGNOSTIC BILATERAL MAMMOGRAM WITH CAD AND TOMO ULTRASOUND RIGHT BREAST COMPARISON:  Previous exam(s). ACR Breast Density Category c: The breast tissue is heterogeneously dense, which may obscure small masses. FINDINGS: Mammographically, there are no suspicious masses, areas of architectural distortion or microcalcifications in either breast. There is a less prominent in size and density subcentimeter nodule in the right 8 o'clock breast, posterior depth. Mammographic images were processed with CAD. On physical exam, no suspicious masses are palpated. Targeted ultrasound is performed, showing right breast 8 o'clock 4 cm from the nipple benign-appearing hypoechoic circumscribed horizontally oriented nodule measuring 1.0 x 0.9 x 0.3 cm. The nodule measured 1.0 x 0.4 x 1.1 cm on the prior ultrasound dated 06/11/2017. IMPRESSION: Stable to decreased in size right breast 8 o'clock benign-appearing nodule. Continued short-term follow-up is recommended. RECOMMENDATION: Diagnostic mammogram and possibly ultrasound of the right breast in 6 months. (Code:DM-R-56M) I have discussed the findings and recommendations with the patient. Results were also provided in writing at the conclusion of the visit. If applicable, a reminder letter will be sent to the patient regarding the next appointment.  BI-RADS CATEGORY  3: Probably benign. Electronically Signed   By: Fidela Salisbury M.D.   On: 10/17/2017 16:00   Mm Diag Breast Tomo Bilateral  Result Date: 10/17/2017 CLINICAL DATA:  Follow-up of probably benign right breast 8 o'clock mass, first identified in May 2018. EXAM: DIGITAL DIAGNOSTIC BILATERAL MAMMOGRAM WITH CAD AND TOMO ULTRASOUND RIGHT BREAST COMPARISON:  Previous exam(s). ACR Breast Density Category c: The breast tissue is heterogeneously dense, which may obscure small masses. FINDINGS: Mammographically, there are no suspicious masses, areas of architectural distortion or microcalcifications in either breast. There is a less prominent in size and density subcentimeter nodule in the right 8 o'clock breast, posterior depth. Mammographic images were processed with CAD. On physical exam, no suspicious masses are palpated. Targeted ultrasound is performed, showing right breast 8 o'clock 4 cm from the nipple benign-appearing hypoechoic circumscribed horizontally oriented nodule measuring 1.0 x 0.9 x 0.3 cm. The nodule measured 1.0 x 0.4 x 1.1 cm on the prior ultrasound dated 06/11/2017. IMPRESSION: Stable  to decreased in size right breast 8 o'clock benign-appearing nodule. Continued short-term follow-up is recommended. RECOMMENDATION: Diagnostic mammogram and possibly ultrasound of the right breast in 6 months. (Code:DM-R-46M) I have discussed the findings and recommendations with the patient. Results were also provided in writing at the conclusion of the visit. If applicable, a reminder letter will be sent to the patient regarding the next appointment. BI-RADS CATEGORY  3: Probably benign. Electronically Signed   By: Fidela Salisbury M.D.   On: 10/17/2017 16:00    Assessment & Plan:   Problem List Items Addressed This Visit    Elevated ALT measurement - Primary    In the setting of hepatic steatosis and recent weight gain. Encouraged to change diet to low GI, start exercising  abstian from  alcohol and use of acetominophen . May need revaccination for Hep A and B,  Will repeat hepatic panel and antibody titers to Hep A and B in a few weeks       Weight gain    Will rule out thyroid disorder,  Encouraged to resume a low GI diet and regular participation in exercise.       Relevant Orders   TSH    Other Visit Diagnoses    Need for hepatitis B screening test       Relevant Orders   Hepatitis B surface antibody,qualitative   Need for hepatitis A vaccination       Relevant Orders   Hepatitis A Ab, Total   Impaired fasting glucose       Relevant Orders   Hemoglobin A1c    A total of 25 minutes of face to face time was spent with patient more than half of which was spent in counselling about the above mentioned conditions  and coordination of care   I am having Laniesha N. Sharlett Iles maintain her Vitamin D3, Fish Oil, [place] Ginseng, FIBER PO, calcium carbonate, and Psyllium (METAMUCIL FIBER PO).  No orders of the defined types were placed in this encounter.   There are no discontinued medications.  Follow-up: Return for nonfasting labs 2 weeks .   Crecencio Mc, MD

## 2018-04-08 NOTE — Patient Instructions (Signed)
Living with fatty liver means protecting your liver from insults caused by:  Viruses Too much tylenol Too much alcohol    when you return for labs in 2 weeks  We'll  Check your Hep A and B titers to confirm your immunity is intact   Nonalcoholic Fatty Liver Disease Diet Nonalcoholic fatty liver disease is a condition that causes fat to accumulate in and around the liver. The disease makes it harder for the liver to work the way that it should. Following a healthy diet can help to keep nonalcoholic fatty liver disease under control. It can also help to prevent or improve conditions that are associated with the disease, such as heart disease, diabetes, high blood pressure, and abnormal cholesterol levels. Along with regular exercise, this diet:  Promotes weight loss.  Helps to control blood sugar levels.  Helps to improve the way that the body uses insulin.  What do I need to know about this diet?  Use the glycemic index (GI) to plan your meals. The index tells you how quickly a food will raise your blood sugar. Choose low-GI foods. These foods take a longer time to raise blood sugar.  Keep track of how many calories you take in. Eating the right amount of calories will help you to achieve a healthy weight.  You may want to follow a Mediterranean diet. This diet includes a lot of vegetables, lean meats or fish, whole grains, fruits, and healthy oils and fats. What foods can I eat? Grains Whole grains, such as whole-wheat or whole-grain breads, crackers, tortillas, cereals, and pasta. Stone-ground whole wheat. Pumpernickel bread. Unsweetened oatmeal. Bulgur. Barley. Quinoa. Brown or wild rice. Corn or whole-wheat flour tortillas. Vegetables Lettuce. Spinach. Peas. Beets. Cauliflower. Cabbage. Broccoli. Carrots. Tomatoes. Squash. Eggplant. Herbs. Peppers. Onions. Cucumbers. Brussels sprouts. Yams and sweet potatoes. Beans. Lentils. Fruits Bananas. Apples. Oranges. Grapes. Papaya. Mango.  Pomegranate. Kiwi. Grapefruit. Cherries. Meats and Other Protein Sources Seafood and shellfish. Lean meats. Poultry. Tofu. Dairy Low-fat or fat-free dairy products, such as yogurt, cottage cheese, and cheese. Beverages Water. Sugar-free drinks. Tea. Coffee. Low-fat or skim milk. Milk alternatives, such as soy or almond milk. Real fruit juice. Condiments Mustard. Relish. Low-fat, low-sugar ketchup and barbecue sauce. Low-fat or fat-free mayonnaise. Sweets and Desserts Sugar-free sweets. Fats and Oils Avocado. Canola or olive oil. Nuts and nut butters. Seeds. The items listed above may not be a complete list of recommended foods or beverages. Contact your dietitian for more options. What foods are not recommended? Palm oil and coconut oil. Processed foods. Fried foods. Sweetened drinks, such as sweet tea, milkshakes, snow cones, iced sweet drinks, and sodas. Alcohol. Sweets. Foods that contain a lot of salt or sodium. The items listed above may not be a complete list of foods and beverages to avoid. Contact your dietitian for more information. This information is not intended to replace advice given to you by your health care provider. Make sure you discuss any questions you have with your health care provider. Document Released: 09/01/2014 Document Revised: 09/23/2015 Document Reviewed: 05/12/2014 Elsevier Interactive Patient Education  [employer].

## 2018-04-09 DIAGNOSIS — R635 Abnormal weight gain: Secondary | ICD-10-CM | POA: Insufficient documentation

## 2018-04-09 NOTE — Assessment & Plan Note (Signed)
Will rule out thyroid disorder,  Encouraged to resume a low GI diet and regular participation in exercise.

## 2018-04-09 NOTE — Assessment & Plan Note (Addendum)
In the setting of hepatic steatosis and recent weight gain. Encouraged to change diet to low GI, start exercising  abstian from alcohol and use of acetominophen . May need revaccination for Hep A and B,  Will repeat hepatic panel and antibody titers to Hep A and B in a few weeks

## 2018-04-15 ENCOUNTER — Other Ambulatory Visit
Admission: RE | Admit: 2018-04-15 | Discharge: 2018-04-15 | Disposition: A | Discharge status: Home / Self Care | Payer: 59 | Source: Ambulatory Visit | Attending: Internal Medicine | Admitting: Internal Medicine

## 2018-04-15 DIAGNOSIS — R740 Nonspecific elevation of levels of transaminase and lactic acid dehydrogenase [LDH]: Secondary | ICD-10-CM | POA: Diagnosis not present

## 2018-04-15 DIAGNOSIS — Z1159 Encounter for screening for other viral diseases: Secondary | ICD-10-CM | POA: Insufficient documentation

## 2018-04-15 DIAGNOSIS — R635 Abnormal weight gain: Secondary | ICD-10-CM | POA: Diagnosis not present

## 2018-04-15 DIAGNOSIS — R7301 Impaired fasting glucose: Secondary | ICD-10-CM | POA: Insufficient documentation

## 2018-04-15 DIAGNOSIS — R7401 Elevation of levels of liver transaminase levels: Secondary | ICD-10-CM

## 2018-04-15 DIAGNOSIS — Z23 Encounter for immunization: Secondary | ICD-10-CM | POA: Diagnosis not present

## 2018-04-15 LAB — HEPATIC FUNCTION PANEL
ALT: 58 U/L — ABNORMAL HIGH (ref 0–44)
AST: 31 U/L (ref 15–41)
Albumin: 4.1 g/dL (ref 3.5–5.0)
Alkaline Phosphatase: 60 U/L (ref 38–126)
Total Bilirubin: 0.5 mg/dL (ref 0.3–1.2)
Total Protein: 7 g/dL (ref 6.5–8.1)

## 2018-04-15 LAB — CK: Total CK: 146 U/L (ref 38–234)

## 2018-04-15 LAB — TSH: TSH: 1.326 u[IU]/mL (ref 0.350–4.500)

## 2018-04-16 LAB — HEMOGLOBIN A1C
Hgb A1c MFr Bld: 5.1 % (ref 4.8–5.6)
MEAN PLASMA GLUCOSE: 99.67 mg/dL

## 2018-04-17 LAB — HEPATITIS B SURFACE ANTIBODY,QUALITATIVE: Hep B S Ab: REACTIVE

## 2018-04-17 LAB — HEPATITIS A ANTIBODY, TOTAL: HEP A TOTAL AB: NEGATIVE

## 2018-04-19 DIAGNOSIS — D0361 Melanoma in situ of right upper limb, including shoulder: Secondary | ICD-10-CM | POA: Diagnosis not present

## 2018-04-19 DIAGNOSIS — C439 Malignant melanoma of skin, unspecified: Secondary | ICD-10-CM

## 2018-04-19 HISTORY — DX: Malignant melanoma of skin, unspecified: C43.9

## 2018-04-22 ENCOUNTER — Ambulatory Visit (INDEPENDENT_AMBULATORY_CARE_PROVIDER_SITE_OTHER): Payer: Self-pay | Admitting: Physician Assistant

## 2018-04-22 ENCOUNTER — Encounter: Payer: Self-pay | Admitting: Physician Assistant

## 2018-04-22 VITALS — BP 114/70 | HR 87 | Temp 98.4°F | Ht 66.0 in | Wt 184.0 lb

## 2018-04-22 DIAGNOSIS — R05 Cough: Secondary | ICD-10-CM

## 2018-04-22 DIAGNOSIS — R059 Cough, unspecified: Secondary | ICD-10-CM

## 2018-04-22 DIAGNOSIS — J40 Bronchitis, not specified as acute or chronic: Secondary | ICD-10-CM

## 2018-04-22 MED ORDER — BENZONATATE 200 MG PO CAPS: 200.0000 mg | ORAL_CAPSULE | Freq: Two times a day (BID) | ORAL | 0 refills | Status: DC | PRN

## 2018-04-22 MED ORDER — ALBUTEROL SULFATE (2.5 MG/3ML) 0.083% IN NEBU
2.5000 mg | INHALATION_SOLUTION | Freq: Once | RESPIRATORY_TRACT | Status: AC
  Administered 2018-04-22: 2.5 mg via RESPIRATORY_TRACT

## 2018-04-22 MED ORDER — ALBUTEROL SULFATE HFA 108 (90 BASE) MCG/ACT IN AERS: 2.0000 | INHALATION_SPRAY | RESPIRATORY_TRACT | 0 refills | Status: DC | PRN

## 2018-04-22 MED ORDER — PREDNISONE 50 MG PO TABS: 50.0000 mg | ORAL_TABLET | Freq: Every day | ORAL | 0 refills | Status: AC

## 2018-04-22 NOTE — Progress Notes (Signed)
Patient ID: Pamela Lara DOB: [birth year] AGE: 57 y.o. MRN: [id number]   PCP: Crecencio Mc, MD   Chief Complaint:  Chief Complaint  Patient presents with  . Cough    x2wk     Subjective:    HPI:  Pamela Lara is a 57 y.o. female presents for evaluation  Chief Complaint  Patient presents with  . Cough    x52wk    57 year old female presents to [facility] with two week history of cough. Began with one week history of URI symptoms; rhinorrhea, nasal congestion, sinus pressure/congestion, and cough. URI symptoms resolved other than cough. Cough past week worsening. Primarily dry. Will have coughing fits; results in shortness of breath/catching breath sensation. Nonproductive. Triggered by cold weather. Improves with using OTC throat lozenges/cough drops. Has taken Mucinex and Delsym with minimal relief. Denies fever, chills, headache, ear pain, sinus pain, sore throat, chest pain, wheezing, chest congestion/heaviness/pressure, post-tussive vomiting. Former cigarette smoker; quit in February 2019, smoked since age 15. No asthma, COPD, or emphysema diagnosis. States has not been prescribed albuterol for bronchitis in years. Patient with GERD. Diet controlled. Denies recent flare-up.  A limited review of symptoms was performed, pertinent positives and negatives as mentioned in HPI.  The following portions of the patient's history were reviewed and updated as appropriate: allergies, current medications and past medical history.  Patient Active Problem List   Diagnosis Date Noted  . Weight gain 04/09/2018  . Colon cancer screening   . Polyp of sigmoid colon   . Rectal polyp   . Hepatic steatosis 08/12/2016  . S/P partial colectomy 04/11/2016  . Elevated ALT measurement 05/16/2015  . Left thyroid nodule 04/30/2014  . Cervical cancer screening 04/05/2013  . GERD (gastroesophageal reflux disease) 04/24/2012  . External hemorrhoid 04/24/2012  . Encounter for  preventive health examination 03/29/2012  . History of tobacco abuse 12/21/2010    Allergies  Allergen Reactions  . Ivp Dye [Iodinated Diagnostic Agents] Shortness Of Breath  . Morphine And Related Nausea And Vomiting  . Other     General anesthesia makes her nauseated with vomiting    . Wellbutrin [Bupropion] Other (See Comments)    Joint pain    Current Outpatient Medications on File Prior to Visit  Medication Sig Dispense Refill  . calcium carbonate (TUMS - DOSED IN MG ELEMENTAL CALCIUM) 500 MG chewable tablet Chew 1 tablet by mouth as needed for indigestion or heartburn.    . Cholecalciferol (VITAMIN D3) 1000 UNITS CAPS Take 1,000 Units by mouth daily.     Marland Kitchen FIBER PO Take 625 mg by mouth daily.     Coralyn Pear Ginseng 1000 MG TABS Take 1,000 mg by mouth daily.     . mupirocin ointment (BACTROBAN) 2 % Place 1 application into the nose 2 (two) times daily.    . Omega-3 Fatty Acids (FISH OIL) 1000 MG CAPS Take 1,000 mg by mouth daily.     . Psyllium (METAMUCIL FIBER PO) Take by mouth.     No current facility-administered medications on file prior to visit.        Objective:   Vitals:   04/22/18 1210  BP: 114/70  Pulse: 87  Temp: 98.4 F (36.9 C)  SpO2: 97%     Wt Readings from Last 3 Encounters:  04/22/18 184 lb (83.5 kg)  04/08/18 182 lb (82.6 kg)  01/31/18 178 lb (80.7 kg)    Physical Exam:   General Appearance:  Patient sitting comfortably  on examination table. Conversational. [name] self-historian. In no acute distress. Afebrile.   Head:  Normocephalic, without obvious abnormality, atraumatic  Eyes:  PERRL, conjunctiva/corneas clear, EOM's intact  Ears:  Bilateral ear canals WNL. No erythema or edema. No discharge/drainage. Bilateral TMs WNL. No erythema, injection, or serous effusion. No scar tissue.  Nose: Nares normal, septum midline. No discharge. Normal mucosa. No sinus tenderness with percussion/palpation.  Throat: Lips, mucosa, and tongue normal; teeth and  gums normal. Throat reveals no erythema. Tonsils surgically absent.  Neck: Supple, symmetrical, trachea midline, no adenopathy  Lungs:   Clear to auscultation bilaterally, respirations unlabored. Good aeration. No wheezing. No wheezing with forced expiration. No rhonchi, crackles, or rales. Tight cough elicited with deep inspiration.  Heart:  Regular rate and rhythm, S1 and S2 normal, no murmur, rub, or gallop  Extremities: Extremities normal, atraumatic, no cyanosis or edema  Pulses: 2+ and symmetric  Skin: Skin color, texture, turgor normal, no rashes or lesions  Lymph nodes: Cervical, supraclavicular, and axillary nodes normal  Neurologic: Normal    Assessment & Plan:    Exam findings, diagnosis etiology and medication use and indications reviewed with patient. Follow-Up and discharge instructions provided. No emergent/urgent issues found on exam.  Patient education was provided.   Patient verbalized understanding of information provided and agrees with plan of care (POC), all questions answered. The patient is advised to call or return to clinic if condition does not see an improvement in symptoms, or to seek the care of the closest emergency department if condition worsens with the below plan.   Patient received albuterol nebulizer treatment: no subjective symptom improvement. Improved aeration. Continued clear lung sounds. Patient able to take deep inspiration without triggering cough. Pulse ox from 97% to 98%.   1. Bronchitis  - benzonatate (TESSALON) 200 MG capsule; Take 1 capsule (200 mg total) by mouth 2 (two) times daily as needed for cough.  Dispense: 20 capsule; Refill: 0 - predniSONE (DELTASONE) 50 MG tablet; Take 1 tablet (50 mg total) by mouth daily with breakfast for 5 days.  Dispense: 5 tablet; Refill: 0 - albuterol (PROVENTIL HFA;VENTOLIN HFA) 108 (90 Base) MCG/ACT inhaler; Inhale 2 puffs into the lungs every 4 (four) hours as needed.  Dispense: 1 Inhaler; Refill: 0  2.  Cough  - albuterol (PROVENTIL) (2.5 MG/3ML) 0.083% nebulizer solution 2.5 mg  Patient with two week history of cough; lingering after URI. Afebrile. Normal lung sounds. VSS. Will treat for viral bronchitis with prednisone blast, albuterol inhaler, and tessalon perles. Advised patient to follow-up with PCP or urgent care if cough not improving in 4-5 days; at that time, CXR may be warranted. Patient agrees with plan.   Darlin Priestly, MHS, PA-C Montey Hora, MHS, PA-C Advanced Practice Provider [facility]  [address], [facility], [place], [place] [address] (p):  [phone] [email or web address]

## 2018-04-22 NOTE — Patient Instructions (Signed)
Thank you for choosing [facility] for your health care needs.  You have been diagnosed with bronchitis (chest cold).  Recommend increase fluids; water, Gatorade, hot tea with lemon/honey, or orange juice. Rest. May use over the counter Delsym for cough. Use cool mist humidifier in bedroom.  Take prescription medication as prescribed:  - benzonatate (TESSALON) 200 MG capsule; Take 1 capsule (200 mg total) by mouth 2 (two) times daily as needed for cough.  Dispense: 20 capsule; Refill: 0 - predniSONE (DELTASONE) 50 MG tablet; Take 1 tablet (50 mg total) by mouth daily with breakfast for 5 days.  Dispense: 5 tablet; Refill: 0 - albuterol (PROVENTIL HFA;VENTOLIN HFA) 108 (90 Base) MCG/ACT inhaler; Inhale 2 puffs into the lungs every 4 (four) hours as needed.  Dispense: 1 Inhaler; Refill: 0  Follow-up with family physician or urgent care in 4-5 days if symptoms not improving. Follow-up sooner if any worsening symptoms.  Acute Bronchitis, Adult Acute bronchitis is when air tubes (bronchi) in the lungs suddenly get swollen. The condition can make it hard to breathe. It can also cause these symptoms:  A cough.  Coughing up clear, yellow, or green mucus.  Wheezing.  Chest congestion.  Shortness of breath.  A fever.  Body aches.  Chills.  A sore throat. Follow these instructions at home:  Medicines  Take over-the-counter and prescription medicines only as told by your doctor.  If you were prescribed an antibiotic medicine, take it as told by your doctor. Do not stop taking the antibiotic even if you start to feel better. General instructions  Rest.  Drink enough fluids to keep your pee (urine) pale yellow.  Avoid smoking and secondhand smoke. If you smoke and you need help quitting, ask your doctor. Quitting will help your lungs heal faster.  Use an inhaler, cool mist vaporizer, or humidifier as told by your doctor.  Keep all follow-up visits as told by your doctor. This is  important. How is this prevented? To lower your risk of getting this condition again:  Wash your hands often with soap and water. If you cannot use soap and water, use hand sanitizer.  Avoid contact with people who have cold symptoms.  Try not to touch your hands to your mouth, nose, or eyes.  Make sure to get the flu shot every year. Contact a doctor if:  Your symptoms do not get better in 2 weeks. Get help right away if:  You cough up blood.  You have chest pain.  You have very bad shortness of breath.  You become dehydrated.  You faint (pass out) or keep feeling like you are going to pass out.  You keep throwing up (vomiting).  You have a very bad headache.  Your fever or chills gets worse. This information is not intended to replace advice given to you by your health care provider. Make sure you discuss any questions you have with your health care provider. Document Released: 10/04/2007 Document Revised: 11/29/2016 Document Reviewed: 10/06/2015 Elsevier Interactive Patient Education  2019 [employer].

## 2018-04-25 ENCOUNTER — Ambulatory Visit
Admission: RE | Admit: 2018-04-25 | Discharge: 2018-04-25 | Disposition: A | Discharge status: Home / Self Care | Payer: 59 | Source: Ambulatory Visit | Attending: Internal Medicine | Admitting: Internal Medicine

## 2018-04-25 ENCOUNTER — Other Ambulatory Visit: Payer: Self-pay | Admitting: Internal Medicine

## 2018-04-25 DIAGNOSIS — N6489 Other specified disorders of breast: Secondary | ICD-10-CM | POA: Diagnosis not present

## 2018-04-25 DIAGNOSIS — R921 Mammographic calcification found on diagnostic imaging of breast: Secondary | ICD-10-CM

## 2018-04-25 DIAGNOSIS — N6310 Unspecified lump in the right breast, unspecified quadrant: Secondary | ICD-10-CM | POA: Diagnosis not present

## 2018-04-25 DIAGNOSIS — R928 Other abnormal and inconclusive findings on diagnostic imaging of breast: Secondary | ICD-10-CM | POA: Diagnosis not present

## 2018-04-29 ENCOUNTER — Encounter: Payer: Self-pay | Admitting: Family

## 2018-04-29 ENCOUNTER — Telehealth: Payer: 59 | Admitting: Family

## 2018-04-29 DIAGNOSIS — J208 Acute bronchitis due to other specified organisms: Secondary | ICD-10-CM

## 2018-04-29 DIAGNOSIS — B9689 Other specified bacterial agents as the cause of diseases classified elsewhere: Secondary | ICD-10-CM

## 2018-04-29 MED ORDER — AZITHROMYCIN 250 MG PO TABS: ORAL_TABLET | ORAL | 0 refills | Status: DC

## 2018-04-29 MED ORDER — BENZONATATE 100 MG PO CAPS: 100.0000 mg | ORAL_CAPSULE | Freq: Three times a day (TID) | ORAL | 0 refills | Status: DC | PRN

## 2018-04-29 NOTE — Progress Notes (Signed)
Thank you for the details you included in the comment boxes. Those details are very helpful in determining the best course of treatment for you and help [place] to provide the best care. A chest xray would be beneficial, yet there is always the risk/benefit of radiation exposure. As you are not in severe distress at this point, we can forego the xray and treat this based on symptoms. See plan below.  We are sorry that you are not feeling well.  Here is how we plan to help!  Based on your presentation I believe you most likely have A cough due to bacteria.  When patients have a fever and a productive cough with a change in color or increased sputum production, we are concerned about bacterial bronchitis.  If left untreated it can progress to pneumonia.  If your symptoms do not improve with your treatment plan it is important that you contact your provider.   I have prescribed Azithromyin 250 mg: two tablets now and then one tablet daily for 4 additonal days    In addition you may use A non-prescription cough medication called Mucinex DM: take 2 tablets every 12 hours. and A prescription cough medication called Tessalon Perles 100mg . You may take 1-2 capsules every 8 hours as needed for your cough.  I refilled the tessalon above.   From your responses in the eVisit questionnaire you describe inflammation in the upper respiratory tract which is causing a significant cough.  This is commonly called Bronchitis and has four common causes:    Allergies  Viral Infections  Acid Reflux  Bacterial Infection Allergies, viruses and acid reflux are treated by controlling symptoms or eliminating the cause. An example might be a cough caused by taking certain blood pressure medications. You stop the cough by changing the medication. Another example might be a cough caused by acid reflux. Controlling the reflux helps control the cough.  USE OF BRONCHODILATOR ("RESCUE") INHALERS: There is a risk from using your  bronchodilator too frequently.  The risk is that over-reliance on a medication which only relaxes the muscles surrounding the breathing tubes can reduce the effectiveness of medications prescribed to reduce swelling and congestion of the tubes themselves.  Although you feel brief relief from the bronchodilator inhaler, your asthma may actually be worsening with the tubes becoming more swollen and filled with mucus.  This can delay other crucial treatments, such as oral steroid medications. If you need to use a bronchodilator inhaler daily, several times per day, you should discuss this with your provider.  There are probably better treatments that could be used to keep your asthma under control.     HOME CARE . Only take medications as instructed by your medical team. . Complete the entire course of an antibiotic. . Drink plenty of fluids and get plenty of rest. . Avoid close contacts especially the very young and the elderly . Cover your mouth if you cough or cough into your sleeve. . Always remember to wash your hands . A steam or ultrasonic humidifier can help congestion.   GET HELP RIGHT AWAY IF: . You develop worsening fever. . You become short of breath . You cough up blood. . Your symptoms persist after you have completed your treatment plan MAKE SURE YOU   Understand these instructions.  Will watch your condition.  Will get help right away if you are not doing well or get worse.  Your e-visit answers were reviewed by a board certified advanced clinical practitioner  to complete your personal care plan.  Depending on the condition, your plan could have included both over the counter or prescription medications. If there is a problem please reply  once you have received a response from your provider. Your safety is important to [place].  If you have drug allergies check your prescription carefully.    You can use MyChart to ask questions about today's visit, request a non-urgent call  back, or ask for a work or school excuse for 24 hours related to this e-Visit. If it has been greater than 24 hours you will need to follow up with your provider, or enter a new e-Visit to address those concerns. You will get an e-mail in the next two days asking about your experience.  I hope that your e-visit has been valuable and will speed your recovery. Thank you for using e-visits.

## 2018-04-30 NOTE — Telephone Encounter (Signed)
Sarah, CMA stated that she spoke with the pt to schedule an appt and she stated that the pt told her that she did an E visit and she is feeling better and her urine is back to normal. Pt declined appt.

## 2018-05-24 IMAGING — US US BREAST*R* LIMITED INC AXILLA
1 series · 7 of 7 positions shown · non-contrast
Comparison: Right breast ultrasound 10/11/2016.

CLINICAL DATA: Approximate 8 month interval follow-up of a likely
benign complex cyst involving the lower outer quadrant of the right
abnormality in the lower outer quadrant at far posterior depth which
she initially noted in the last 1-2 weeks.

EXAM:
ULTRASOUND OF THE RIGHT BREAST

[Series 1: us breast*right* limited inc axilla · 0.06mm/px · 7 of 7 slices shown]
[im 1/7]
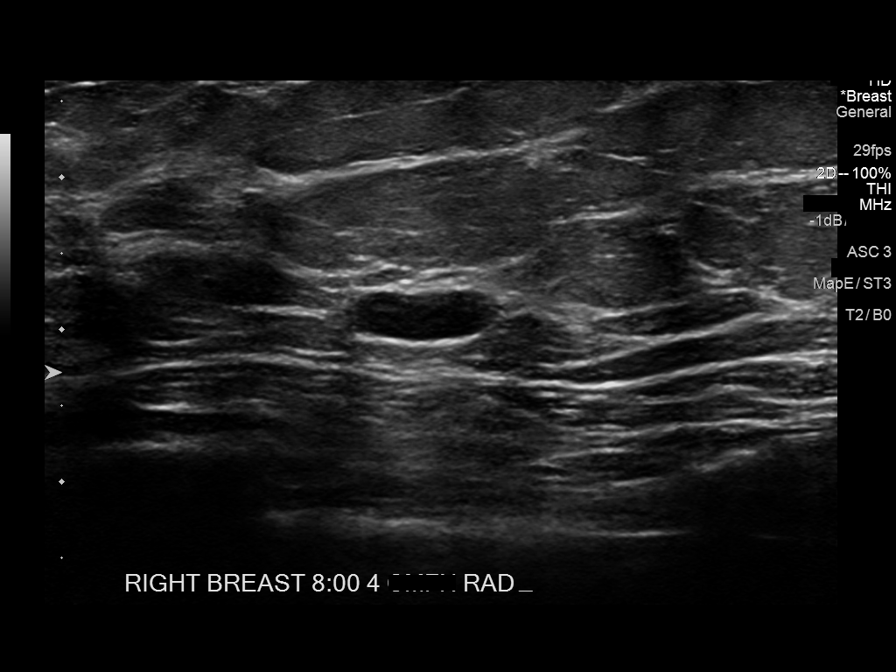
[im 2/7]
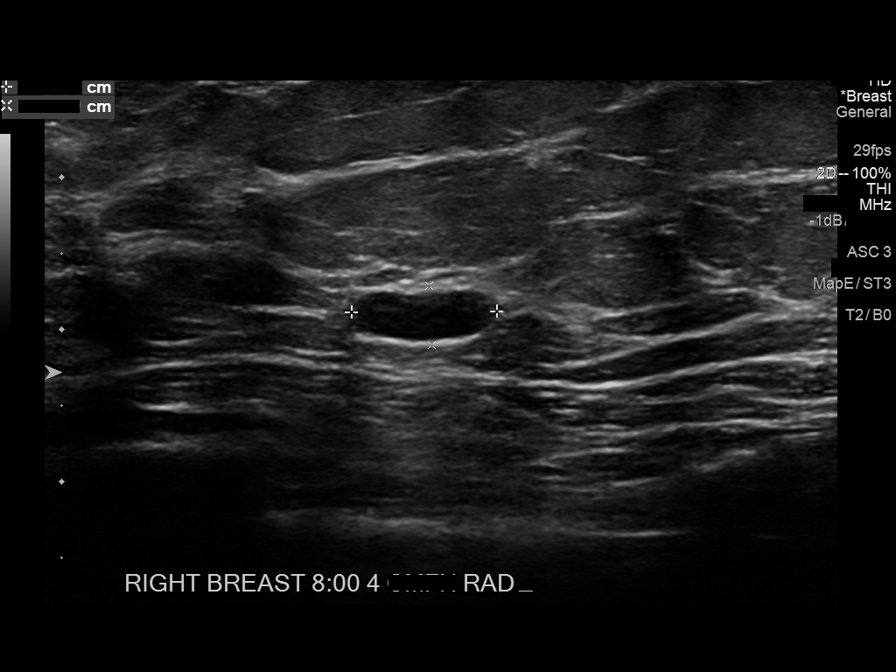
[im 3/7]
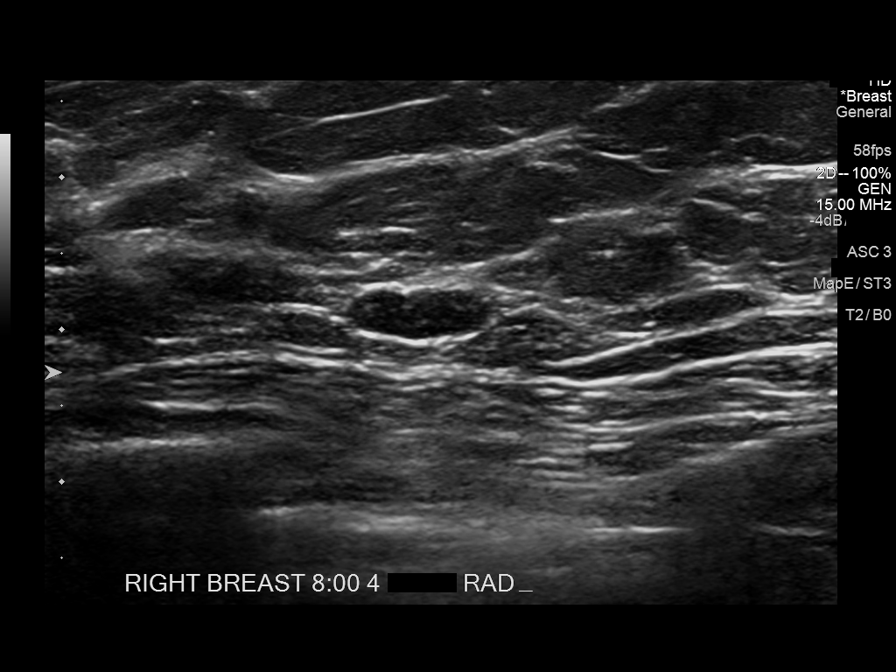
[im 4/7]
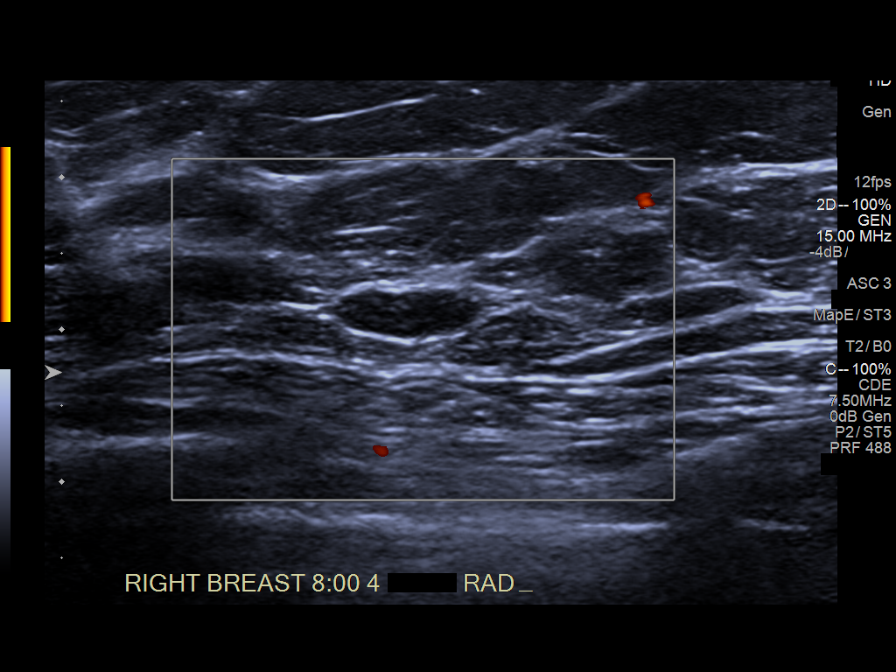
[im 5/7]
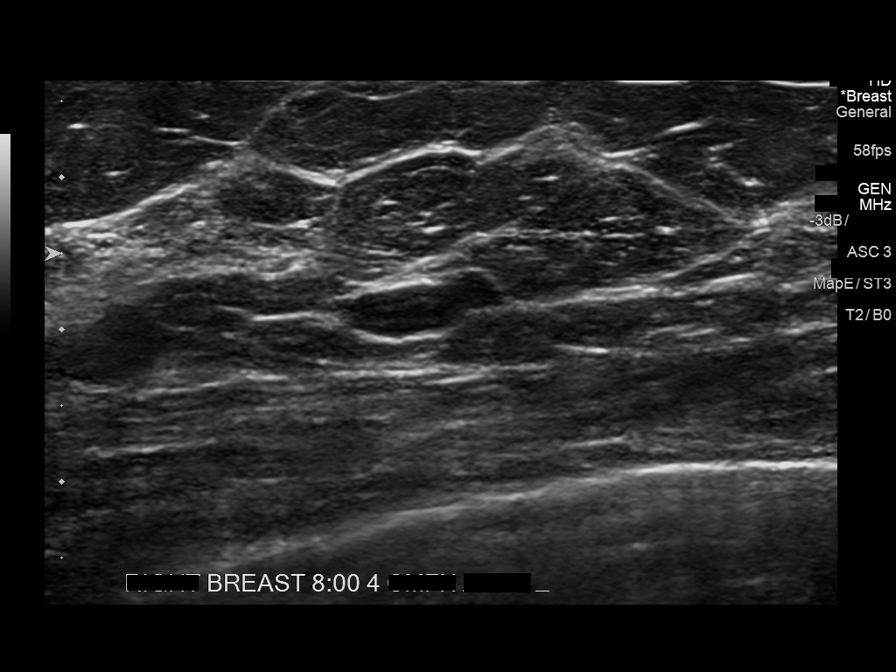
[im 6/7]
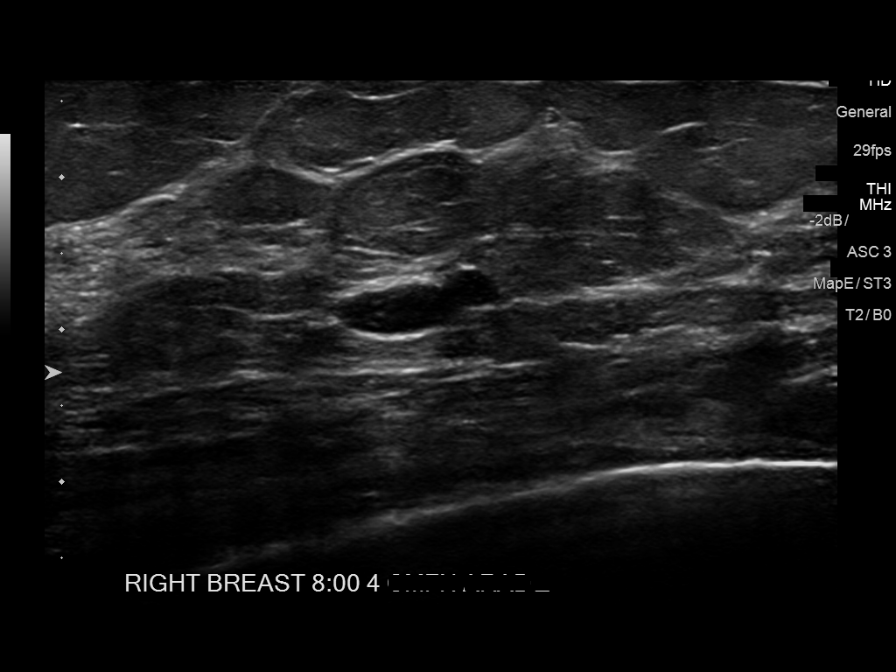
[im 7/7]
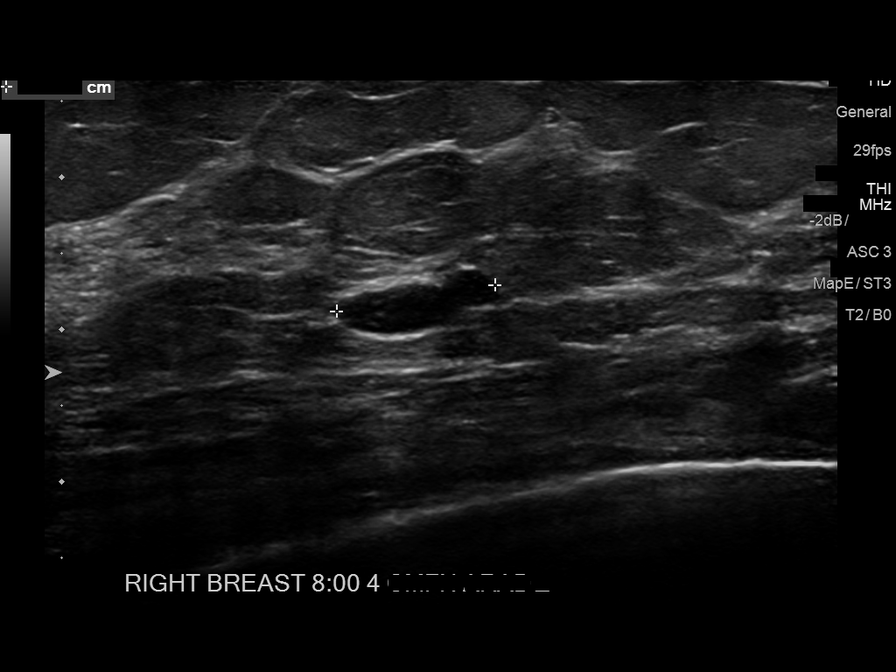

[7 of 7 positions shown; findings below may reference images not displayed]

FINDINGS: On physical exam, there is a palpable ridge of tissue in the lower
outer quadrant of the right breast at far posterior depth
corresponding to what the patient is feeling, though there is no
discrete palpable mass.

Targeted right breast ultrasound is performed, showing the
previously identified circumscribed oval parallel nearly anechoic
mass with internal echoes at the 8 o'clock position approximately 4
cm from the nipple at posterior depth, measuring approximately 1.0 x
0.4 x 1.1 cm (previously 1.0 x 0.4 x 1.2 cm), demonstrating
posterior acoustic enhancement and no internal power Doppler flow.
No new masses are identified. Sonographic evaluation of the palpable
concern in the lower outer right breast demonstrates normal
fibrofatty and fibroglandular tissue.
IMPRESSION: Stable likely benign mildly complex cyst involving the lower outer
quadrant of the right breast at posterior depth.

RECOMMENDATION:
Right breast ultrasound at the time of annual bilateral diagnostic
mammography which is due in September 2017.

I have discussed the findings and recommendations with the patient.
Results were also provided in writing at the conclusion of the
visit. If applicable, a reminder letter will be sent to the patient
regarding the next appointment.

BI-RADS CATEGORY  3: Probably benign.

## 2018-09-09 DIAGNOSIS — D2271 Melanocytic nevi of right lower limb, including hip: Secondary | ICD-10-CM | POA: Diagnosis not present

## 2018-09-09 DIAGNOSIS — D229 Melanocytic nevi, unspecified: Secondary | ICD-10-CM | POA: Diagnosis not present

## 2018-09-09 DIAGNOSIS — D2230 Melanocytic nevi of unspecified part of face: Secondary | ICD-10-CM | POA: Diagnosis not present

## 2018-09-09 DIAGNOSIS — D485 Neoplasm of uncertain behavior of skin: Secondary | ICD-10-CM | POA: Diagnosis not present

## 2018-09-09 DIAGNOSIS — Z1283 Encounter for screening for malignant neoplasm of skin: Secondary | ICD-10-CM | POA: Diagnosis not present

## 2018-09-09 DIAGNOSIS — Z85820 Personal history of malignant melanoma of skin: Secondary | ICD-10-CM | POA: Diagnosis not present

## 2018-09-09 DIAGNOSIS — L578 Other skin changes due to chronic exposure to nonionizing radiation: Secondary | ICD-10-CM | POA: Diagnosis not present

## 2018-09-09 DIAGNOSIS — D225 Melanocytic nevi of trunk: Secondary | ICD-10-CM | POA: Diagnosis not present

## 2018-09-09 DIAGNOSIS — L812 Freckles: Secondary | ICD-10-CM | POA: Diagnosis not present

## 2018-09-09 DIAGNOSIS — D1800 Hemangioma unspecified site: Secondary | ICD-10-CM | POA: Diagnosis not present

## 2018-09-09 DIAGNOSIS — D239 Other benign neoplasm of skin, unspecified: Secondary | ICD-10-CM

## 2018-09-09 HISTORY — DX: Other benign neoplasm of skin, unspecified: D23.9

## 2018-09-19 ENCOUNTER — Other Ambulatory Visit: Payer: Self-pay

## 2018-09-19 ENCOUNTER — Ambulatory Visit (INDEPENDENT_AMBULATORY_CARE_PROVIDER_SITE_OTHER): Payer: Self-pay | Admitting: Physician Assistant

## 2018-09-19 VITALS — BP 118/76 | HR 86 | Temp 98.3°F | Resp 16 | Wt 177.0 lb

## 2018-09-19 DIAGNOSIS — H8111 Benign paroxysmal vertigo, right ear: Secondary | ICD-10-CM

## 2018-09-19 DIAGNOSIS — H6981 Other specified disorders of Eustachian tube, right ear: Secondary | ICD-10-CM

## 2018-09-19 MED ORDER — FLUTICASONE PROPIONATE 50 MCG/ACT NA SUSP: 2.0000 | Freq: Every day | NASAL | 0 refills | Status: DC

## 2018-09-19 MED ORDER — MECLIZINE HCL 25 MG PO TABS: 25.0000 mg | ORAL_TABLET | Freq: Three times a day (TID) | ORAL | 0 refills | Status: DC | PRN

## 2018-09-19 MED ORDER — CETIRIZINE-PSEUDOEPHEDRINE ER 5-120 MG PO TB12: 1.0000 | ORAL_TABLET | Freq: Two times a day (BID) | ORAL | 0 refills | Status: AC

## 2018-09-19 NOTE — Patient Instructions (Addendum)
Thank you for choosing [facility] for your health care needs.  You have been diagnosed with: 1. Acute dysfunction of right eustachian tube - cetirizine-pseudoephedrine (ZYRTEC-D) 5-120 MG tablet; Take 1 tablet by mouth 2 (two) times daily for 7 days.  Dispense: 14 tablet; Refill: 0 - fluticasone (FLONASE) 50 MCG/ACT nasal spray; Place 2 sprays into both nostrils daily.  Dispense: 16 g; Refill: 0  2. Benign paroxysmal positional vertigo of right ear - meclizine (ANTIVERT) 25 MG tablet; Take 1 tablet (25 mg total) by mouth 3 (three) times daily as needed for dizziness.  Dispense: 15 tablet; Refill: 0  Take medications as prescribed. Decongestant may cause slightly increase in blood pressure. Meclizine, for dizziness, may cause somnolence/sleepiness.  Be careful with driving, while having dizziness.  Perform Epley Maneuver at home.  Follow-up with ENT in 2-3 days if symptoms not resolving. Go directly to the urgent care or ED if you develop headache, change in vision, worsening dizziness, slurred speech, confusion, nausea/vomiting, arm/leg tingling/weakness, or other new/concerning symptom.  Hope you feel better soon!  How to Perform the Epley Maneuver The Epley maneuver is an exercise that relieves symptoms of vertigo. Vertigo is the feeling that you or your surroundings are moving when they are not. When you feel vertigo, you may feel like the room is spinning and have trouble walking. Dizziness is a little different than vertigo. When you are dizzy, you may feel unsteady or light-headed. You can do this maneuver at home whenever you have symptoms of vertigo. You can do it up to 3 times a day until your symptoms go away. Even though the Epley maneuver may relieve your vertigo for a few weeks, it is possible that your symptoms will return. This maneuver relieves vertigo, but it does not relieve dizziness. What are the risks? If it is done correctly, the Epley maneuver is considered safe.  Sometimes it can lead to dizziness or nausea that goes away after a short time. If you develop other symptoms, such as changes in vision, weakness, or numbness, stop doing the maneuver and call your health care provider. How to perform the Epley maneuver 1. Sit on the edge of a bed or table with your back straight and your legs extended or hanging over the edge of the bed or table. 2. Turn your head halfway toward the affected ear or side. 3. Lie backward quickly with your head turned until you are lying flat on your back. You may want to position a pillow under your shoulders. 4. Hold this position for 30 seconds. You may experience an attack of vertigo. This is normal. 5. Turn your head to the opposite direction until your unaffected ear is facing the floor. 6. Hold this position for 30 seconds. You may experience an attack of vertigo. This is normal. Hold this position until the vertigo stops. 7. Turn your whole body to the same side as your head. Hold for another 30 seconds. 8. Sit back up. You can repeat this exercise up to 3 times a day. Follow these instructions at home:  After doing the Epley maneuver, you can return to your normal activities.  Ask your health care provider if there is anything you should do at home to prevent vertigo. He or she may recommend that you: ? Keep your head raised (elevated) with two or more pillows while you sleep. ? Do not sleep on the side of your affected ear. ? Get up slowly from bed. ? Avoid sudden movements during the  day. ? Avoid extreme head movement, like looking up or bending over. Contact a health care provider if:  Your vertigo gets worse.  You have other symptoms, including: ? Nausea. ? Vomiting. ? Headache. Get help right away if:  You have vision changes.  You have a severe or worsening headache or neck pain.  You cannot stop vomiting.  You have new numbness or weakness in any part of your body. Summary  Vertigo is the  feeling that you or your surroundings are moving when they are not.  The Epley maneuver is an exercise that relieves symptoms of vertigo.  If the Epley maneuver is done correctly, it is considered safe. You can do it up to 3 times a day. This information is not intended to replace advice given to you by your health care provider. Make sure you discuss any questions you have with your health care provider. Document Released: 04/22/2013 Document Revised: 03/07/2016 Document Reviewed: 03/07/2016 Elsevier Interactive Patient Education  2019 [employer]. Eustachian Tube Dysfunction  Eustachian tube dysfunction refers to a condition in which a blockage develops in the narrow passage that connects the middle ear to the back of the nose (eustachian tube). The eustachian tube regulates air pressure in the middle ear by letting air move between the ear and nose. It also helps to drain fluid from the middle ear space. Eustachian tube dysfunction can affect one or both ears. When the eustachian tube does not function properly, air pressure, fluid, or both can build up in the middle ear. What are the causes? This condition occurs when the eustachian tube becomes blocked or cannot open normally. Common causes of this condition include:  Ear infections.  Colds and other infections that affect the nose, mouth, and throat (upper respiratory tract).  Allergies.  Irritation from cigarette smoke.  Irritation from stomach acid coming up into the esophagus (gastroesophageal reflux). The esophagus is the tube that carries food from the mouth to the stomach.  Sudden changes in air pressure, such as from descending in an airplane or scuba diving.  Abnormal growths in the nose or throat, such as: ? Growths that line the nose (nasal polyps). ? Abnormal growth of cells (tumors). ? Enlarged tissue at the back of the throat (adenoids). What increases the risk? You are more likely to develop this condition  if:  You smoke.  You are overweight.  You are a child who has: ? Certain birth defects of the mouth, such as cleft palate. ? Large tonsils or adenoids. What are the signs or symptoms? Common symptoms of this condition include:  A feeling of fullness in the ear.  Ear pain.  Clicking or popping noises in the ear.  Ringing in the ear.  Hearing loss.  Loss of balance.  Dizziness. Symptoms may get worse when the air pressure around you changes, such as when you travel to an area of high elevation, fly on an airplane, or go scuba diving. How is this diagnosed? This condition may be diagnosed based on:  Your symptoms.  A physical exam of your ears, nose, and throat.  Tests, such as those that measure: ? The movement of your eardrum (tympanogram). ? Your hearing (audiometry). How is this treated? Treatment depends on the cause and severity of your condition.  In mild cases, you may relieve your symptoms by moving air into your ears. This is called "popping the ears."  In more severe cases, or if you have symptoms of fluid in your ears, treatment  may include: ? Medicines to relieve congestion (decongestants). ? Medicines that treat allergies (antihistamines). ? Nasal sprays or ear drops that contain medicines that reduce swelling (steroids). ? A procedure to drain the fluid in your eardrum (myringotomy). In this procedure, a small tube is placed in the eardrum to:  Drain the fluid.  Restore the air in the middle ear space. ? A procedure to insert a balloon device through the nose to inflate the opening of the eustachian tube (balloon dilation). Follow these instructions at home: Lifestyle  Do not do any of the following until your health care provider approves: ? Travel to high altitudes. ? Fly in airplanes. ? Work in a Pension scheme manager or room. ? Scuba dive.  Do not use any products that contain nicotine or tobacco, such as cigarettes and e-cigarettes. If you need  help quitting, ask your health care provider.  Keep your ears dry. Wear fitted earplugs during showering and bathing. Dry your ears completely after. General instructions  Take over-the-counter and prescription medicines only as told by your health care provider.  Use techniques to help pop your ears as recommended by your health care provider. These may include: ? Chewing gum. ? Yawning. ? Frequent, forceful swallowing. ? Closing your mouth, holding your nose closed, and gently blowing as if you are trying to blow air out of your nose.  Keep all follow-up visits as told by your health care provider. This is important. Contact a health care provider if:  Your symptoms do not go away after treatment.  Your symptoms come back after treatment.  You are unable to pop your ears.  You have: ? A fever. ? Pain in your ear. ? Pain in your head or neck. ? Fluid draining from your ear.  Your hearing suddenly changes.  You become very dizzy.  You lose your balance. Summary  Eustachian tube dysfunction refers to a condition in which a blockage develops in the eustachian tube.  It can be caused by ear infections, allergies, inhaled irritants, or abnormal growths in the nose or throat.  Symptoms include ear pain, hearing loss, or ringing in the ears.  Mild cases are treated with maneuvers to unblock the ears, such as yawning or ear popping.  Severe cases are treated with medicines. Surgery may also be done (rare). This information is not intended to replace advice given to you by your health care provider. Make sure you discuss any questions you have with your health care provider. Document Released: 05/14/2015 Document Revised: 08/07/2017 Document Reviewed: 08/07/2017 Elsevier Interactive Patient Education  2019 [employer].

## 2018-09-19 NOTE — Telephone Encounter (Signed)
Called and spoke to son. Son advised.

## 2018-09-19 NOTE — Progress Notes (Signed)
Patient ID: Pamela Lara DOB: December 15, 1960 AGE: 57 y.o. MRN: [id number]   PCP: Crecencio Mc, MD   Chief Complaint:  Chief Complaint  Patient presents with  . Otitis Media    x2d     Subjective:    HPI:  Pamela Lara is a 57 y.o. female presents for evaluation  Chief Complaint  Patient presents with  . Otitis Media    x26d    58 year old female presents to [facility] with three day history of right ear symptoms. Reports began as mild fullness/pressure sensation. 2/10; denies pain, describes as uncomfortable. Became more severe yesterday, had pain with loud noises, such as dropping an item on the floor. Yesterday developed mild dizziness; describes as room spinning sensation. Elicited with turning head quickly to the side or with bending head forward such as putting shoes on. 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 Treated by Dr. Tami Ribas with [facility]. Resolved with Epley maneuver in office. Patient performed Epley maneuver at home yesterday, helped with symptoms, did not resolve symptoms completely. Patient with known seasonal allergies. Was taking Loratadine daily; discontinued medication one week ago, felt seasonal allergies were under control. Patient with watery eyes. Denies fever, chills, sweats, body aches, fatigue, headache, ear discharge/drainage, sinus pain, nasal congestion, rhinorrhea, sneezing, sore throat, PND, cough, chest pain, SOB, palpitations. Denies gait abnormality, change in vision, extremity weakness/paresthesias. Denies CVA history. Former cigarette smoker; 06/28/2017. Patient denies known Covid19 exposure. Denies recent travel. Patient regularly uses Q-tips. No previous history of cerumen impaction. No recent swimming. Has been using ear buds more frequently.  A limited review of symptoms was performed, pertinent positives and negatives as mentioned in HPI.  The following portions of the patient's history were  reviewed and updated as appropriate: allergies, current medications and past medical history.  Patient Active Problem List   Diagnosis Date Noted  . Weight gain 04/09/2018  . Colon cancer screening   . Polyp of sigmoid colon   . Rectal polyp   . Hepatic steatosis 08/12/2016  . S/P partial colectomy 04/11/2016  . Elevated ALT measurement 05/16/2015  . Left thyroid nodule 04/30/2014  . Cervical cancer screening 04/05/2013  . GERD (gastroesophageal reflux disease) 04/24/2012  . External hemorrhoid 04/24/2012  . Encounter for preventive health examination 03/29/2012  . History of tobacco abuse 12/21/2010  . Ectopic pregnancy 01/29/2000  . S/P tubal ligation 01/29/2000  . S/P chole 11/30/1983    Allergies  Allergen Reactions  . Ivp Dye [Iodinated Diagnostic Agents] Shortness Of Breath  . Morphine And Related Nausea And Vomiting  . Other     General anesthesia makes her nauseated with vomiting    . Wellbutrin [Bupropion] Other (See Comments)    Joint pain    Current Outpatient Medications on File Prior to Visit  Medication Sig Dispense Refill  . calcium carbonate (TUMS - DOSED IN MG ELEMENTAL CALCIUM) 500 MG chewable tablet Chew 1 tablet by mouth as needed for indigestion or heartburn.    . Cholecalciferol (VITAMIN D3) 1000 UNITS CAPS Take 1,000 Units by mouth daily.     Coralyn Pear Ginseng 1000 MG TABS Take 1,000 mg by mouth daily.     . Omega-3 Fatty Acids (FISH OIL) 1000 MG CAPS Take 1,000 mg by mouth daily.     . Psyllium (METAMUCIL FIBER PO) Take by mouth.    . FIBER PO Take 625 mg by mouth daily.     . mupirocin ointment (BACTROBAN) 2 %  Place 1 application into the nose 2 (two) times daily.     No current facility-administered medications on file prior to visit.        Objective:   Vitals:   09/19/18 1359  BP: 118/76  Pulse: 86  Resp: 16  Temp: 98.3 F (36.8 C)  SpO2: 97%     Wt Readings from Last 3 Encounters:  09/19/18 177 lb (80.3 kg)  04/22/18 184 lb  (83.5 kg)  04/08/18 182 lb (82.6 kg)    Physical Exam:   General Appearance:  Patient sitting comfortably on examination table. Conversational. [name] self-historian. In no acute distress. Afebrile.   Head:  Normocephalic, without obvious abnormality, atraumatic  Eyes:  PERRL, conjunctiva/corneas clear, EOM's intact. No horizontal nystagmus.  Ears:  Left ear canal WNL. No erythema or edema. No open wound. No visible purulent drainage. No tenderness with palpation over left tragus or with manipulation of left auricle. No visible erythema or edema of left mastoid. No tenderness with palpation over left mastoid. Right ear canal WNL. No erythema or edema. No open wound. No visible purulent drainage. No tenderness with palpation over right tragus or with manipulation of right auricle. No visible erythema or edema of right mastoid. No tenderness with palpation over right mastoid. Left TM WNL. Good light reflex. Visible landmarks. No erythema. No injection. No bulging or retraction. No visible perforation. No serous effusion. No visible purulent effusion. No tympanostomy tube. No scar tissue. Right TM WNL. Good light reflex. Visible landmarks. No erythema. No injection. No bulging or retraction. No visible perforation. No serous effusion. No visible purulent effusion. No tympanostomy tube. No scar tissue.  Nose: Nares normal. Septum midline. No visible polyps. No discharge. Normal mucosa. No sinus tenderness with percussion/palpation.  Throat: Lips, mucosa, and tongue normal; teeth and gums normal. Throat reveals no erythema. No postnasal drip. No visible cobblestoning. Tonsils with no enlargement or exudate. Uvula midline with no edema or erythema.  Neck: Supple, symmetrical, trachea midline, no adenopathy  Lungs:   Clear to auscultation bilaterally, respirations unlabored. Good aeration. No rales, rhonchi, crackles or wheezing.  Heart:  Regular rate and rhythm, S1 and S2 normal, no murmur, rub, or gallop   Extremities: Extremities normal, atraumatic, no cyanosis or edema  Pulses: 2+ and symmetric  Skin: Skin color, texture, turgor normal, no rashes or lesions  Lymph nodes: Cervical, supraclavicular, and axillary nodes normal  Neurologic: Normal. Finger to nose WNL. 5/5 upper and lower extremity motor strength. RAMs WNL. No pronator drift. No gait abnormality. Dizziness able to be elicited with quickly turning head to the right.    Assessment & Plan:    Exam findings, diagnosis etiology and medication use and indications reviewed with patient. Follow-Up and discharge instructions provided. No emergent/urgent issues found on exam.  Patient education was provided.   Patient verbalized understanding of information provided and agrees with plan of care (POC), all questions answered. The patient is advised to call or return to clinic if condition does not see an improvement in symptoms, or to seek the care of the closest emergency department if condition worsens with the below plan.    1. Acute dysfunction of right eustachian tube - cetirizine-pseudoephedrine (ZYRTEC-D) 5-120 MG tablet; Take 1 tablet by mouth 2 (two) times daily for 7 days.  Dispense: 14 tablet; Refill: 0 - fluticasone (FLONASE) 50 MCG/ACT nasal spray; Place 2 sprays into both nostrils daily.  Dispense: 16 g; Refill: 0  2. Benign paroxysmal positional vertigo of right ear -  meclizine (ANTIVERT) 25 MG tablet; Take 1 tablet (25 mg total) by mouth 3 (three) times daily as needed for dizziness.  Dispense: 15 tablet; Refill: 0  Patient with three day history of right ear discomfort. Fullness/pressure. Associated dizziness; "room spinning sensation." History of seasonal allergies and BPPV. Benign physical examination; VSS, afebrile, in no acute distress, benign ear examination (no indication of AOM, otitis externa, mastoiditis). Dizziness elicited with turning head quickly. Neuro exam non-concerning; 5/5 upper and lower extremity  strength, negative pronator drift, finger to nose WNL, RAMs WNL, no gait abnormality. Believe patient has right ETD with associated BPPV, most likely secondary to seasonal allergies. Advised use of antihistamine, decongestant, and steroid nasal spray. Prescribed Zyrtec-D bid and Flonase. Advised use of Epley maneuver at home. Advised patient f/u with ENT in 2-3 days if not improving. Advised patient f/u at urgent care or ED immediately with headache, fever, change in vision, worsening dizziness, extremity weakness/paresthesias, gait abnormality, chest pain, etc. Patient agreed with plan.    Darlin Priestly, MHS, PA-C Montey Hora, MHS, PA-C [facility]  [address] [place], [place] [address] (p): [phone] [email or web address]

## 2018-10-24 DIAGNOSIS — Z03818 Encounter for observation for suspected exposure to other biological agents ruled out: Secondary | ICD-10-CM | POA: Diagnosis not present

## 2018-12-12 DIAGNOSIS — L578 Other skin changes due to chronic exposure to nonionizing radiation: Secondary | ICD-10-CM | POA: Diagnosis not present

## 2018-12-12 DIAGNOSIS — D2230 Melanocytic nevi of unspecified part of face: Secondary | ICD-10-CM | POA: Diagnosis not present

## 2018-12-12 DIAGNOSIS — L814 Other melanin hyperpigmentation: Secondary | ICD-10-CM | POA: Diagnosis not present

## 2018-12-12 DIAGNOSIS — L811 Chloasma: Secondary | ICD-10-CM | POA: Diagnosis not present

## 2018-12-12 DIAGNOSIS — D229 Melanocytic nevi, unspecified: Secondary | ICD-10-CM | POA: Diagnosis not present

## 2018-12-12 DIAGNOSIS — D225 Melanocytic nevi of trunk: Secondary | ICD-10-CM | POA: Diagnosis not present

## 2018-12-12 DIAGNOSIS — Z85820 Personal history of malignant melanoma of skin: Secondary | ICD-10-CM | POA: Diagnosis not present

## 2018-12-12 DIAGNOSIS — Z1283 Encounter for screening for malignant neoplasm of skin: Secondary | ICD-10-CM | POA: Diagnosis not present

## 2018-12-12 DIAGNOSIS — L821 Other seborrheic keratosis: Secondary | ICD-10-CM | POA: Diagnosis not present

## 2019-03-06 ENCOUNTER — Ambulatory Visit
Admission: RE | Admit: 2019-03-06 | Discharge: 2019-03-06 | Disposition: A | Discharge status: Home / Self Care | Payer: 59 | Source: Ambulatory Visit | Attending: Internal Medicine | Admitting: Internal Medicine

## 2019-03-06 ENCOUNTER — Other Ambulatory Visit: Payer: Self-pay | Admitting: Internal Medicine

## 2019-03-06 DIAGNOSIS — R921 Mammographic calcification found on diagnostic imaging of breast: Secondary | ICD-10-CM | POA: Diagnosis not present

## 2019-03-06 DIAGNOSIS — N6310 Unspecified lump in the right breast, unspecified quadrant: Secondary | ICD-10-CM

## 2019-03-06 DIAGNOSIS — R922 Inconclusive mammogram: Secondary | ICD-10-CM | POA: Diagnosis not present

## 2019-03-06 DIAGNOSIS — N6313 Unspecified lump in the right breast, lower outer quadrant: Secondary | ICD-10-CM | POA: Diagnosis not present

## 2019-04-10 ENCOUNTER — Ambulatory Visit: Payer: 59 | Admitting: Internal Medicine

## 2019-04-17 ENCOUNTER — Ambulatory Visit (INDEPENDENT_AMBULATORY_CARE_PROVIDER_SITE_OTHER): Payer: 59 | Admitting: Internal Medicine

## 2019-04-17 ENCOUNTER — Telehealth: Payer: Self-pay | Admitting: Internal Medicine

## 2019-04-17 ENCOUNTER — Encounter: Payer: Self-pay | Admitting: Internal Medicine

## 2019-04-17 ENCOUNTER — Other Ambulatory Visit: Payer: Self-pay

## 2019-04-17 DIAGNOSIS — Z20828 Contact with and (suspected) exposure to other viral communicable diseases: Secondary | ICD-10-CM

## 2019-04-17 DIAGNOSIS — Z20822 Contact with and (suspected) exposure to covid-19: Secondary | ICD-10-CM

## 2019-04-17 MED ORDER — ONDANSETRON 4 MG PO TBDP: 4.0000 mg | ORAL_TABLET | Freq: Three times a day (TID) | ORAL | 0 refills | Status: DC | PRN

## 2019-04-17 MED ORDER — PREDNISONE 10 MG PO TABS: ORAL_TABLET | ORAL | 0 refills | Status: DC

## 2019-04-17 MED ORDER — ALBUTEROL SULFATE HFA 108 (90 BASE) MCG/ACT IN AERS: 2.0000 | INHALATION_SPRAY | Freq: Four times a day (QID) | RESPIRATORY_TRACT | 0 refills | Status: DC | PRN

## 2019-04-17 MED ORDER — HYDROCOD POLST-CPM POLST ER 10-8 MG/5ML PO SUER: 5.0000 mL | Freq: Every evening | ORAL | 0 refills | Status: DC | PRN

## 2019-04-17 NOTE — Telephone Encounter (Signed)
Pt had a virtual visit with Dr. Derrel Nip today.

## 2019-04-17 NOTE — Telephone Encounter (Signed)
Pt had a negative Covid test done on Monday and is now having symptom  fever,dry cough, Pt daughter tested positive over the weekend  Employee Health told pt that she needed to get a Virtual appt with her PCP asap

## 2019-04-17 NOTE — Telephone Encounter (Signed)
No available appts until next week.

## 2019-04-17 NOTE — Telephone Encounter (Signed)
Ok to add as a virtula at 2:00

## 2019-04-17 NOTE — Progress Notes (Signed)
Virtual Visit via [email or web address] Note  This visit type was conducted due to national recommendations for restrictions regarding the COVID-19 pandemic (e.g. social distancing).  This format is felt to be most appropriate for this patient at this time.  All issues noted in this document were discussed and addressed.  No physical exam was performed (except for noted visual exam findings with Video Visits).   I connected with@ on 04/17/19 at  2:00 PM EST by a video enabled telemedicine application and verified that I am speaking with the correct person using two identifiers. Location patient: home Location provider: work or home office Persons participating in the virtual visit: patient, provider  I discussed the limitations, risks, security and privacy concerns of performing an evaluation and management service by telephone and the availability of in person appointments. I also discussed with the patient that there may be a patient responsible charge related to this service. The patient expressed understanding and agreed to proceed.  Reason for visit:   Signs and symptoms of COVID   HPI:  58 yr old female with new onset cough, dyspnea, fatigue and fever of 24 hours  In the setting of a positive exposure . Daughter tested positive last weekend. Patient  was tested on Monday and test was negative , but her daughter,  Daughter's mother in law,  And step daughter are all positive. \ Patient states that they all ate in a restaurant in [place] on Dec 6 [place] WAS UNMASKED.   Symptoms of fatigue dyspnea,  Intense body aches and fever started yesterday,  Pulse ox currently 96% at rest,  Pulse 90 at rest.    ROS: See pertinent positives and negatives per HPI.  Past Medical History:  Diagnosis Date  . Arthritis    right shoulder  . BPPV (benign paroxysmal positional vertigo)   . Congenital bowed legs    s/p  surgical correction  . Diverticulitis, colon    recurrent  . GERD  (gastroesophageal reflux disease)    watch diet (as needed takes apple cider vinager)  . History of colonic diverticulitis    hx multiple acute diverticulitis episodes treated medically  . History of ectopic pregnancy   . History of Helicobacter pylori infection 2013  approx.  Marland Kitchen PONV (postoperative nausea and vomiting)   . Seasonal affective disorder [facility])     Past Surgical History:  Procedure Laterality Date  . CHOLECYSTECTOMY OPEN  1984  . COLONOSCOPY WITH PROPOFOL N/A 04/19/2017   Procedure: COLONOSCOPY WITH PROPOFOL;  Surgeon: Lucilla Lame, MD;  Location: [facility];  Service: Endoscopy;  Laterality: N/A;  . CYSTOSCOPY WITH STENT PLACEMENT Bilateral 08/15/2017   Procedure: CYSTOSCOPY WITH  FOLEY PLACEMENT WITH BILATERAL open ended catheter placement.  catheters removed at the end of the case.;  Surgeon: Lucas Mallow, MD;  Location: [facility];  Service: Urology;  Laterality: Bilateral;  . EXCISIONAL SENTINAL LYMPH NODE BIOPSY, NECK N/A 2016  approx.   per pt thyroid area , benign  . LAPAROSCOPIC SIGMOID COLECTOMY N/A 08/15/2017   Procedure: LAPAROSCOPIC SIGMOID COLECTOMY  ERAS PATHWAY;  Surgeon: Ileana Roup, MD;  Location: [facility];  Service: General;  Laterality: N/A;  . LAPAROSCOPY FOR ECTOPIC PREGNANCY  2001   also  BILATERAL TUBAL LIGATION  . OSTEOTOMY Bilateral 1980 and 1981   correction congenital bow legged  . TONSILLECTOMY      Family History  Problem Relation Age of Onset  . Cancer Mother   . Arthritis  Mother   . Hypertension Mother   . Cancer Father 40       Mesothelioma  . Arthritis Father   . Hypertension Father   . Breast cancer Neg Hx     SOCIAL HX:  reports that she quit smoking about 21 months ago. Her smoking use included cigarettes. She has a 9.50 pack-year smoking history. She has never used smokeless tobacco. She reports current alcohol use. She reports that she does not use drugs.   Current Outpatient Medications:  .   Cholecalciferol (VITAMIN D3) 1000 UNITS CAPS, Take 1,000 Units by mouth daily. , Disp: , Rfl:  .  fluticasone (FLONASE) 50 MCG/ACT nasal spray, Place 2 sprays into both nostrils daily., Disp: 16 g, Rfl: 0 .  [place] Ginseng 1000 MG TABS, Take 1,000 mg by mouth daily. , Disp: , Rfl:  .  meclizine (ANTIVERT) 25 MG tablet, Take 1 tablet (25 mg total) by mouth 3 (three) times daily as needed for dizziness., Disp: 15 tablet, Rfl: 0 .  Multiple Vitamin (MULTIVITAMIN) tablet, Take 1 tablet by mouth daily., Disp: , Rfl:  .  Omega-3 Fatty Acids (FISH OIL) 1000 MG CAPS, Take 1,000 mg by mouth daily. , Disp: , Rfl:  .  Psyllium (METAMUCIL FIBER PO), Take by mouth., Disp: , Rfl:  .  ZINC-VITAMIN C PO, Take 1 tablet by mouth daily., Disp: , Rfl:  .  albuterol (VENTOLIN HFA) 108 (90 Base) MCG/ACT inhaler, Inhale 2 puffs into the lungs every 6 (six) hours as needed for wheezing or shortness of breath., Disp: 8 g, Rfl: 0 .  calcium carbonate (TUMS - DOSED IN MG ELEMENTAL CALCIUM) 500 MG chewable tablet, Chew 1 tablet by mouth as needed for indigestion or heartburn., Disp: , Rfl:  .  chlorpheniramine-HYDROcodone (TUSSIONEX PENNKINETIC ER) 10-8 MG/5ML SUER, Take 5 mLs by mouth at bedtime as needed., Disp: 140 mL, Rfl: 0 .  FIBER PO, Take 625 mg by mouth daily. , Disp: , Rfl:  .  mupirocin ointment (BACTROBAN) 2 %, Place 1 application into the nose 2 (two) times daily., Disp: , Rfl:  .  ondansetron (ZOFRAN ODT) 4 MG disintegrating tablet, Take 1 tablet (4 mg total) by mouth every 8 (eight) hours as needed for nausea or vomiting., Disp: 20 tablet, Rfl: 0 .  predniSONE (DELTASONE) 10 MG tablet, 6 tablets daily for 3 days , then reduce by 1 tablet daily until gone, Disp: 33 tablet, Rfl: 0  EXAM:  VITALS per patient if applicable:  GENERAL: alert, oriented, appears well and in no acute distress  HEENT: atraumatic, conjunttiva clear, no obvious abnormalities on inspection of external nose and ears  NECK: normal  movements of the head and neck  LUNGS: on inspection no signs of respiratory distress, breathing rate appears normal, no obvious gross SOB, gasping or wheezing  CV: no obvious cyanosis  MS: moves all visible extremities without noticeable abnormality  PSYCH/NEURO: pleasant and cooperative, no obvious depression or anxiety, speech and thought processing grossly intact  ASSESSMENT AND PLAN:  Discussed the following assessment and plan:  Suspected COVID-19 virus infection  Suspected COVID-19 virus infection counselled patient in the probability of COVID 19 infection given her history and symptoms and the likelihood of a  false negative test .  She was advised to quarantine and will be treated with bronchodilators, cough suppressants and prednisone .  She is monitoring her sats and has no underlying pulmonary or other issues that increase her risk for severe complications,  So the  need for re testing is not necessary     I discussed the assessment and treatment plan with the patient. The patient was provided an opportunity to ask questions and all were answered. The patient agreed with the plan and demonstrated an understanding of the instructions.   The patient was advised to call back or seek an in-person evaluation if the symptoms worsen or if the condition fails to improve as anticipated.  I provided  25 minutes of non-face-to-face time during this encounter reviewing patient's current problems and past procedures/imaging studies, providing counseling on the above mentioned problems , and coordination  of care .  Crecencio Mc, MD

## 2019-04-18 ENCOUNTER — Ambulatory Visit: Payer: 59 | Attending: Internal Medicine

## 2019-04-18 DIAGNOSIS — Z20828 Contact with and (suspected) exposure to other viral communicable diseases: Secondary | ICD-10-CM | POA: Diagnosis not present

## 2019-04-18 DIAGNOSIS — Z20822 Contact with and (suspected) exposure to covid-19: Secondary | ICD-10-CM

## 2019-04-19 DIAGNOSIS — Z8616 Personal history of COVID-19: Secondary | ICD-10-CM | POA: Insufficient documentation

## 2019-04-19 LAB — NOVEL CORONAVIRUS, NAA: SARS-CoV-2, NAA: DETECTED — AB

## 2019-04-19 NOTE — Assessment & Plan Note (Signed)
counselled patient in the probability of COVID 19 infection given her history and symptoms and the likelihood of a  false negative test .  She was advised to quarantine and will be treated with bronchodilators, cough suppressants and prednisone .  She is monitoring her sats and has no underlying pulmonary or other issues that increase her risk for severe complications,  So the need for re testing is not necessary

## 2019-04-20 ENCOUNTER — Telehealth: Payer: Self-pay | Admitting: Infectious Diseases

## 2019-04-20 NOTE — Telephone Encounter (Signed)
Chart reviewed with (+) COVID19 test in consideration for monoclonal antibody infusion treatment.  Covid symptoms started 12/16 after her daughter and daughter's mother in law tested positive and ate together at a restaurant.   She does not meet criteria for at risk population that would potentially benefit from monoclonal antibody.    Janene Madeira, MSN, NP-C [facility] [email or web address] Pager: [phone] Office: [phone] [place]: [phone]

## 2019-05-05 ENCOUNTER — Ambulatory Visit: Payer: 59 | Admitting: Internal Medicine

## 2019-05-06 ENCOUNTER — Telehealth: Payer: Self-pay | Admitting: Internal Medicine

## 2019-05-06 DIAGNOSIS — R05 Cough: Secondary | ICD-10-CM

## 2019-05-06 DIAGNOSIS — R058 Other specified cough: Secondary | ICD-10-CM

## 2019-05-06 NOTE — Telephone Encounter (Signed)
Pt stated that she tested positive for covid on 04/18/2019 with her symptoms starting on 04/12/2019. Pt stated that all her covid symptoms have went away but 2 days ago she developed a cough(productive), and hoarseness. No fever, no body aches, no headaches has gotten her smell and taste back. Pt stated that when she coughs phlegm up it is yellow in color.

## 2019-05-06 NOTE — Telephone Encounter (Signed)
MyChart message sent   You should get a chest x ray tomorrow  rule out a superimposed pneumonia.   At [facility]   once I see the  Results I can treat.  I already saw you  on Dec 17 so you do not  need to schedule appt/

## 2019-05-06 NOTE — Telephone Encounter (Signed)
Pt wants to know what they can do about post covid cough. Please advise

## 2019-05-06 NOTE — Addendum Note (Signed)
Addended by: Crecencio Mc on: 05/06/2019 05:06 PM   Modules accepted: Orders

## 2019-05-07 ENCOUNTER — Ambulatory Visit
Admission: RE | Admit: 2019-05-07 | Discharge: 2019-05-07 | Disposition: A | Discharge status: Home / Self Care | Payer: 59 | Source: Ambulatory Visit | Attending: Internal Medicine | Admitting: Internal Medicine

## 2019-05-07 ENCOUNTER — Other Ambulatory Visit: Payer: Self-pay | Admitting: Internal Medicine

## 2019-05-07 DIAGNOSIS — R05 Cough: Secondary | ICD-10-CM | POA: Insufficient documentation

## 2019-05-07 DIAGNOSIS — R058 Other specified cough: Secondary | ICD-10-CM

## 2019-05-07 MED ORDER — PREDNISONE 10 MG PO TABS: ORAL_TABLET | ORAL | 0 refills | Status: DC

## 2019-05-09 ENCOUNTER — Ambulatory Visit: Payer: 59 | Admitting: Internal Medicine

## 2019-07-09 ENCOUNTER — Other Ambulatory Visit: Payer: Self-pay

## 2019-07-09 DIAGNOSIS — Z86018 Personal history of other benign neoplasm: Secondary | ICD-10-CM | POA: Diagnosis not present

## 2019-07-09 DIAGNOSIS — D2262 Melanocytic nevi of left upper limb, including shoulder: Secondary | ICD-10-CM | POA: Diagnosis not present

## 2019-07-09 DIAGNOSIS — Z86007 Personal history of in-situ neoplasm of skin: Secondary | ICD-10-CM | POA: Diagnosis not present

## 2019-07-09 DIAGNOSIS — D2272 Melanocytic nevi of left lower limb, including hip: Secondary | ICD-10-CM | POA: Diagnosis not present

## 2019-07-09 DIAGNOSIS — D2271 Melanocytic nevi of right lower limb, including hip: Secondary | ICD-10-CM | POA: Diagnosis not present

## 2019-07-09 DIAGNOSIS — L719 Rosacea, unspecified: Secondary | ICD-10-CM | POA: Diagnosis not present

## 2019-07-09 DIAGNOSIS — L821 Other seborrheic keratosis: Secondary | ICD-10-CM | POA: Diagnosis not present

## 2019-07-09 DIAGNOSIS — D225 Melanocytic nevi of trunk: Secondary | ICD-10-CM | POA: Diagnosis not present

## 2019-07-09 DIAGNOSIS — Z1283 Encounter for screening for malignant neoplasm of skin: Secondary | ICD-10-CM | POA: Diagnosis not present

## 2019-07-14 NOTE — Telephone Encounter (Signed)
Spoke with pt and she stated that she has no other appts. Scheduled pt with Mable Paris on Wednesday at 12:00pm.

## 2019-07-16 ENCOUNTER — Ambulatory Visit: Payer: 59 | Admitting: Family

## 2019-08-10 DIAGNOSIS — M5442 Lumbago with sciatica, left side: Secondary | ICD-10-CM

## 2019-08-10 MED ORDER — PREDNISONE 10 MG (21) PO TBPK: ORAL_TABLET | ORAL | 0 refills | Status: DC

## 2019-08-10 MED ORDER — CYCLOBENZAPRINE HCL 10 MG PO TABS: 10.0000 mg | ORAL_TABLET | Freq: Two times a day (BID) | ORAL | 0 refills | Status: DC | PRN

## 2019-08-10 MED ORDER — IBUPROFEN 800 MG PO TABS: 800.0000 mg | ORAL_TABLET | Freq: Three times a day (TID) | ORAL | 0 refills | Status: DC

## 2019-08-10 NOTE — ED Provider Notes (Addendum)
Virtual Visit via Video Note:  Pamela Lara  initiated request for Telemedicine visit with [facility] Ur[facility] team. I connected with Pamela Lara  on 08/10/2019 at 1:03 PM  for a synchronized telemedicine visit using a video enabled HIPPA compliant telemedicine application. I verified that I am speaking with Pamela Lara  using two identifiers. Emerson Monte, FNP  was physically located in a [facility] Urgent care site and Pamela Lara was located at a different location.   The limitations of evaluation and management by telemedicine as well as the availability of in-person appointments were discussed. Patient was informed that she  may incur a bill ( including co-pay) for this virtual visit encounter. Pamela Lara  expressed understanding and gave verbal consent to proceed with virtual visit.     History of Present Illness:Pamela Lara  is a 59 y.o. female presents via telehealth with a complaint  of back pain that began for the past 4 to 5 days.  Denies any precipitating event.  Localized pain to mid back that radiates to the left leg.  Described pain as constant and achy.  Has tried OTC medication without relief. Symptoms are made worse with ROM.  He denies similar symptoms in the past.    Past Medical History:  Diagnosis Date  . Arthritis    right shoulder  . BPPV (benign paroxysmal positional vertigo)   . Congenital bowed legs    s/p  surgical correction  . Diverticulitis, colon    recurrent  . GERD (gastroesophageal reflux disease)    watch diet (as needed takes apple cider vinager)  . History of colonic diverticulitis    hx multiple acute diverticulitis episodes treated medically  . History of ectopic pregnancy   . History of Helicobacter pylori infection 2013  approx.  Marland Kitchen PONV (postoperative nausea and vomiting)   . Seasonal affective disorder (HCC)     Allergies  Allergen Reactions  . Ivp Dye [Iodinated Diagnostic Agents] Shortness  Of Breath  . Morphine And Related Nausea And Vomiting  . Other     General anesthesia makes her nauseated with vomiting    . Wellbutrin [Bupropion] Other (See Comments)    Joint pain        Observations/Objective: VITALS: Per patient if applicable, see vitals. GENERAL: Alert, appears well and in no acute distress. HEENT: Atraumatic, conjunctiva clear, no obvious abnormalities on inspection of external nose and ears. NECK: Normal movements of the head and neck. CARDIOPULMONARY: No increased WOB. Speaking in clear sentences. I:E ratio WNL.  MS: Moves all visible extremities without noticeable abnormality. PSYCH: Pleasant and cooperative, well-groomed. Speech normal rate and rhythm. Affect is appropriate. Insight and judgement are appropriate. Attention is focused, linear, and appropriate.  NEURO: CN grossly intact. Oriented as arrived to appointment on time with no prompting. Moves both UE equally.  SKIN: No obvious lesions, wounds, erythema, or cyanosis noted on face or hands.     Assessment and Plan:   ICD-10-CM   1. Acute midline low back pain with left-sided sciatica  M54.42     Follow Up Instructions: Follow-up with PCP Return for a face-to-face visit if symptom does not resolve   I discussed the assessment and treatment plan with the patient. The patient was provided an opportunity to ask questions and all were answered. The patient agreed with the plan and demonstrated an understanding of the instructions.   The patient was advised to call back or seek an in-person  evaluation if the symptoms worsen or if the condition fails to improve as anticipated.  I provided 15 minutes of non-face-to-face time during this encounter.               Emerson Monte, [place] 08/10/19 1319

## 2019-08-10 NOTE — Discharge Instructions (Signed)
Rest, ice and heat as needed Ensure adequate ROM as tolerated. Prescribed ibuprofen as needed for pain Prescribed prednisone for inflammation Prescribed flexeril  for muscle spasm.  Do not drive or operate heavy machinery while taking this medication Return here or go to ER if you have any new or worsening symptoms such as numbness/tingling of the inner thighs, loss of bladder or bowel control, headache/blurry vision, nausea/vomiting, confusion/altered mental status, dizziness, weakness, passing out, imbalance, etc..Marland Kitchen

## 2019-10-07 ENCOUNTER — Telehealth: Payer: Self-pay | Admitting: Internal Medicine

## 2019-10-07 ENCOUNTER — Other Ambulatory Visit: Payer: Self-pay

## 2019-10-07 DIAGNOSIS — Z0000 Encounter for general adult medical examination without abnormal findings: Secondary | ICD-10-CM

## 2019-10-07 NOTE — Telephone Encounter (Signed)
Pt wants to know if she needs to get lab work done before coming for her physical on Thursday was not schedule for one when appt. was made 6 months ago

## 2019-10-07 NOTE — Telephone Encounter (Signed)
Pt would like to have labs done prior to her physical on Thursday. I have ordered A1c, CBC, CMP, Lipid and TSH. Is there anything else that needs to be ordered?

## 2019-10-09 ENCOUNTER — Other Ambulatory Visit: Payer: Self-pay

## 2019-10-09 ENCOUNTER — Ambulatory Visit (INDEPENDENT_AMBULATORY_CARE_PROVIDER_SITE_OTHER): Payer: 59 | Admitting: Internal Medicine

## 2019-10-09 ENCOUNTER — Encounter: Payer: Self-pay | Admitting: Internal Medicine

## 2019-10-09 VITALS — BP 118/74 | HR 70 | Temp 97.5°F | Resp 15 | Ht 66.0 in | Wt 178.2 lb

## 2019-10-09 DIAGNOSIS — Z0000 Encounter for general adult medical examination without abnormal findings: Secondary | ICD-10-CM

## 2019-10-09 DIAGNOSIS — E559 Vitamin D deficiency, unspecified: Secondary | ICD-10-CM | POA: Diagnosis not present

## 2019-10-09 DIAGNOSIS — Z8616 Personal history of COVID-19: Secondary | ICD-10-CM | POA: Diagnosis not present

## 2019-10-09 DIAGNOSIS — E1169 Type 2 diabetes mellitus with other specified complication: Secondary | ICD-10-CM | POA: Diagnosis not present

## 2019-10-09 DIAGNOSIS — R7401 Elevation of levels of liver transaminase levels: Secondary | ICD-10-CM

## 2019-10-09 DIAGNOSIS — C439 Malignant melanoma of skin, unspecified: Secondary | ICD-10-CM | POA: Insufficient documentation

## 2019-10-09 DIAGNOSIS — K760 Fatty (change of) liver, not elsewhere classified: Secondary | ICD-10-CM

## 2019-10-09 DIAGNOSIS — E785 Hyperlipidemia, unspecified: Secondary | ICD-10-CM

## 2019-10-09 LAB — CBC WITH DIFFERENTIAL/PLATELET
Basophils Absolute: 0 10*3/uL (ref 0.0–0.1)
Basophils Relative: 0.5 % (ref 0.0–3.0)
Eosinophils Absolute: 0.1 10*3/uL (ref 0.0–0.7)
Eosinophils Relative: 1.2 % (ref 0.0–5.0)
HCT: 43.6 % (ref 36.0–46.0)
Hemoglobin: 15.3 g/dL — ABNORMAL HIGH (ref 12.0–15.0)
Lymphocytes Relative: 34.8 % (ref 12.0–46.0)
Lymphs Abs: 2.2 10*3/uL (ref 0.7–4.0)
MCHC: 35.2 g/dL (ref 30.0–36.0)
MCV: 89.4 fl (ref 78.0–100.0)
Monocytes Absolute: 0.5 10*3/uL (ref 0.1–1.0)
Monocytes Relative: 8.6 % (ref 3.0–12.0)
Neutro Abs: 3.4 10*3/uL (ref 1.4–7.7)
Neutrophils Relative %: 54.9 % (ref 43.0–77.0)
Platelets: 196 10*3/uL (ref 150.0–400.0)
RBC: 4.87 Mil/uL (ref 3.87–5.11)
RDW: 12.6 % (ref 11.5–15.5)
WBC: 6.2 10*3/uL (ref 4.0–10.5)

## 2019-10-09 LAB — LDL CHOLESTEROL, DIRECT: Direct LDL: 141 mg/dL

## 2019-10-09 LAB — LIPID PANEL
Cholesterol: 224 mg/dL — ABNORMAL HIGH (ref 0–200)
HDL: 52.6 mg/dL (ref >39.00)
NonHDL: 171.59
Total CHOL/HDL Ratio: 4
Triglycerides: 216 mg/dL — ABNORMAL HIGH (ref 0.0–149.0)
VLDL: 43.2 mg/dL — ABNORMAL HIGH (ref 0.0–40.0)

## 2019-10-09 LAB — COMPREHENSIVE METABOLIC PANEL
ALT: 54 U/L — ABNORMAL HIGH (ref 0–35)
AST: 31 U/L (ref 0–37)
Albumin: 4.5 g/dL (ref 3.5–5.2)
Alkaline Phosphatase: 57 U/L (ref 39–117)
BUN: 12 mg/dL (ref 6–23)
CO2: 24 mEq/L (ref 19–32)
Calcium: 9.6 mg/dL (ref 8.4–10.5)
Chloride: 104 mEq/L (ref 96–112)
Creatinine, Ser: 0.73 mg/dL (ref 0.40–1.20)
GFR: 81.71 mL/min (ref >60.00)
Glucose, Bld: 106 mg/dL — ABNORMAL HIGH (ref 70–99)
Potassium: 4.1 mEq/L (ref 3.5–5.1)
Sodium: 137 mEq/L (ref 135–145)
Total Bilirubin: 0.9 mg/dL (ref 0.2–1.2)
Total Protein: 7.3 g/dL (ref 6.0–8.3)

## 2019-10-09 LAB — TSH: TSH: 1.4 u[IU]/mL (ref 0.35–4.50)

## 2019-10-09 LAB — VITAMIN D 25 HYDROXY (VIT D DEFICIENCY, FRACTURES): VITD: 41.93 ng/mL (ref 30.00–100.00)

## 2019-10-09 NOTE — Progress Notes (Signed)
Patient ID: Pamela Lara, female    DOB: 1960-11-06  Age: 59 y.o. MRN: [id number]  The patient is here for annual preventive examination and management of other chronic and acute problems.  This visit occurred during the SARS-CoV-2 public health emergency.  Safety protocols were in place, including screening questions prior to the visit, additional usage of staff PPE, and extensive cleaning of exam room while observing appropriate contact time as indicated for disinfecting solutions.    Patient has received both doses of the PFIZER  COVID 19 vaccine without complications.  Patient also had the [place] December and still has altered taste and fatigue.  feeles exhausted after a full day of work. . Works 3 12 hours shift. Patient continues to mask when outside of the home except when walking in yard or at safe distances from others .  Patient denies any change in mood or development of unhealthy behaviors resuting from the pandemic's restriction of activities and socialization.     The risk factors are reflected in the social history.  The roster of all physicians providing medical care to patient - is listed in the Snapshot section of the chart.  Activities of daily living:  The patient is 100% independent in all ADLs: dressing, toileting, feeding as well as independent mobility  Home safety : The patient has smoke detectors in the home. They wear seatbelts.  There are no firearms at home. There is no violence in the home.   There is no risks for hepatitis, STDs or HIV. There is no   history of blood transfusion. They have no travel history to infectious disease endemic areas of the world.  The patient has seen their dentist in the last six month. They have seen their eye doctor in the last year. They admit to slight hearing difficulty with regard to whispered voices and some television programs.  They have deferred audiologic testing in the last year.  They do not  have excessive  sun exposure. Discussed the need for sun protection: hats, long sleeves and use of sunscreen if there is significant sun exposure.   Diet: the importance of a healthy diet is discussed. They do have a healthy diet.  The benefits of regular aerobic exercise were discussed. She walks 4 times per week ,  20 minutes.   Depression screen: there are no signs or vegative symptoms of depression- irritability, change in appetite, anhedonia, sadness/tearfullness.  The following portions of the patient's history were reviewed and updated as appropriate: allergies, current medications, past family history, past medical history,  past surgical history, past social history  and problem list.  Visual acuity was not assessed per patient preference since she has regular follow up with her ophthalmologist. Hearing and body mass index were assessed and reviewed.   During the course of the visit the patient was educated and counseled about appropriate screening and preventive services including : fall prevention , diabetes screening, nutrition counseling, colorectal cancer screening, and recommended immunizations.    CC: The primary encounter diagnosis was Vitamin D deficiency. Diagnoses of Melanoma of skin ([place]), Encounter for preventive health examination, Elevated ALT measurement, History of 2019 novel coronavirus disease (COVID-19), and Hepatic steatosis were also pertinent to this visit.  History [name] has a past medical history of Arthritis, BPPV (benign paroxysmal positional vertigo), Congenital bowed legs, Diverticulitis, colon, Ectopic pregnancy (01/29/2000), GERD (gastroesophageal reflux disease), History of colonic diverticulitis, History of ectopic pregnancy, History of Helicobacter pylori infection (2013  approx.), PONV (postoperative  nausea and vomiting), and Seasonal affective disorder ([place]).   She has a past surgical history that includes Colonoscopy with propofol (N/A, 04/19/2017); Cholecystectomy  open (1984); Tonsillectomy; Laparoscopy for ectopic pregnancy (2001); Osteotomy (Bilateral, 1980 and 1981); EXCISIONAL SENTINAL LYMPH NODE BIOPSY, NECK (N/A, 2016  approx.); Laparoscopic sigmoid colectomy (N/A, 08/15/2017); and Cystoscopy with stent placement (Bilateral, 08/15/2017).   Her family history includes Arthritis in her father and mother; Cancer in her mother; Cancer (age of onset: 75) in her father; Hypertension in her father and mother.She reports that she quit smoking about 2 years ago. Her smoking use included cigarettes. She has a 9.50 pack-year smoking history. She has never used smokeless tobacco. She reports current alcohol use. She reports that she does not use drugs.  Outpatient Medications Prior to Visit  Medication Sig Dispense Refill  . calcium carbonate (TUMS - DOSED IN MG ELEMENTAL CALCIUM) 500 MG chewable tablet Chew 1 tablet by mouth as needed for indigestion or heartburn.    . Cholecalciferol (VITAMIN D3) 1000 UNITS CAPS Take 1,000 Units by mouth daily.     . cyclobenzaprine (FLEXERIL) 10 MG tablet Take 1 tablet (10 mg total) by mouth 2 (two) times daily as needed for muscle spasms. 20 tablet 0  . fluticasone (FLONASE) 50 MCG/ACT nasal spray Place 2 sprays into both nostrils daily. 16 g 0  . ibuprofen (ADVIL) 800 MG tablet Take 1 tablet (800 mg total) by mouth 3 (three) times daily. 30 tablet 0  . Korean Ginseng 1000 MG TABS Take 1,000 mg by mouth daily.     . Multiple Vitamin (MULTIVITAMIN) tablet Take 1 tablet by mouth daily.    . Omega-3 Fatty Acids (FISH OIL) 1000 MG CAPS Take 1,000 mg by mouth daily.     . predniSONE (STERAPRED UNI-PAK 21 TAB) 10 MG (21) TBPK tablet Take 6 tabs by mouth daily  for 2 days, then 5 tabs for 2 days, then 4 tabs for 2 days, then 3 tabs for 2 days, 2 tabs for 2 days, then 1 tab by mouth daily for 2 days 42 tablet 0  . Psyllium (METAMUCIL FIBER PO) Take by mouth.    . TURMERIC PO Take 2,250 mg by mouth 2 (two) times daily.    Marland Kitchen ZINC-VITAMIN C  PO Take 1 tablet by mouth daily.    . Glucos-Chond-Hyal Ac-Ca Fructo (MOVE FREE JOINT HEALTH ADVANCE PO)     . albuterol (VENTOLIN HFA) 108 (90 Base) MCG/ACT inhaler Inhale 2 puffs into the lungs every 6 (six) hours as needed for wheezing or shortness of breath. 8 g 0  . chlorpheniramine-HYDROcodone (TUSSIONEX PENNKINETIC ER) 10-8 MG/5ML SUER Take 5 mLs by mouth at bedtime as needed. 140 mL 0  . FIBER PO Take 625 mg by mouth daily.     . meclizine (ANTIVERT) 25 MG tablet Take 1 tablet (25 mg total) by mouth 3 (three) times daily as needed for dizziness. 15 tablet 0  . mupirocin ointment (BACTROBAN) 2 % Place 1 application into the nose 2 (two) times daily.    . ondansetron (ZOFRAN ODT) 4 MG disintegrating tablet Take 1 tablet (4 mg total) by mouth every 8 (eight) hours as needed for nausea or vomiting. 20 tablet 0   No facility-administered medications prior to visit.    Review of Systems   Patient denies headache, fevers, malaise, unintentional weight loss, skin rash, eye pain, sinus congestion and sinus pain, sore throat, dysphagia,  hemoptysis , cough, dyspnea, wheezing, chest pain, palpitations, orthopnea, edema,  abdominal pain, nausea, melena, diarrhea, constipation, flank pain, dysuria, hematuria, urinary  Frequency, nocturia, numbness, tingling, seizures,  Focal weakness, Loss of consciousness,  Tremor, insomnia, depression, anxiety, and suicidal ideation.     Objective:  BP 118/74 (BP Location: Left Arm, Patient Position: Sitting, Cuff Size: Normal)   Pulse 70   Temp (!) 97.5 F (36.4 C) (Temporal)   Resp 15   Ht 5\' 6"  (1.676 m)   Wt 178 lb 3.2 oz (80.8 kg)   LMP 10/03/2015 (Approximate)   SpO2 98%   BMI 28.76 kg/m   Physical Exam  .General appearance: alert, cooperative and appears stated age Head: Normocephalic, without obvious abnormality, atraumatic Eyes: conjunctivae/corneas clear. PERRL, EOM's intact. Fundi benign. Ears: normal TM's and external ear canals both  ears Nose: Nares normal. Septum midline. Mucosa normal. No drainage or sinus tenderness. Throat: lips, mucosa, and tongue normal; teeth and gums normal Neck: no adenopathy, no carotid bruit, no JVD, supple, symmetrical, trachea midline and thyroid not enlarged, symmetric, no tenderness/mass/nodules Lungs: clear to auscultation bilaterally Breasts: normal appearance, no masses or tenderness Heart: regular rate and rhythm, S1, S2 normal, no murmur, click, rub or gallop Abdomen: soft, non-tender; bowel sounds normal; no masses,  no organomegaly Extremities: extremities normal, atraumatic, no cyanosis or edema Pulses: 2+ and symmetric Skin: Skin color, texture, turgor normal. No rashes or lesions Neurologic: Alert and oriented X 3, normal strength and tone. Normal symmetric reflexes. Normal coordination and gait.    Assessment & Plan:   Problem List Items Addressed This Visit      Unprioritized   Elevated ALT measurement    HER ALT remains mildly elevated,  In the setting of fatty liver.  Will refer to GI       Encounter for preventive health examination    age appropriate education and counseling updated, referrals for preventative services and immunizations addressed, dietary and smoking counseling addressed, most recent labs reviewed.  I have personally reviewed and have noted:  1) the patient's medical and social history 2) The pt's use of alcohol, tobacco, and illicit drugs 3) The patient's current medications and supplements 4) Functional ability including ADL's, fall risk, home safety risk, hearing and visual impairment 5) Diet and physical activities 6) Evidence for depression or mood disorder 7) The patient's height, weight, and BMI have been recorded in the chart  I have made referrals, and provided counseling and education based on review of the above      Hepatic steatosis    Her ALT remains elevated.  Will recommend GI referral due to persistent liver enzyme elevation        History of 2019 novel coronavirus disease (COVID-19)    Infected in December 2020,  Recovered without hospitalization.  She has also received the  Vaccination       Melanoma of skin ([place])    Biopsied from right upper arm in march, by [facility].  q 3 month follow up         Other Visit Diagnoses    Vitamin D deficiency    -  Primary   Relevant Orders   VITAMIN D 25 Hydroxy (Vit-D Deficiency, Fractures) (Completed)      I have discontinued Jorden N. Lafortune's FIBER PO, mupirocin ointment, meclizine, albuterol, chlorpheniramine-HYDROcodone, and ondansetron. I am also having her maintain her Vitamin D3, Fish Oil, Korean Ginseng, calcium carbonate, Psyllium (METAMUCIL FIBER PO), fluticasone, ZINC-VITAMIN C PO, multivitamin, predniSONE, cyclobenzaprine, ibuprofen, Glucos-Chond-Hyal Ac-Ca Fructo (MOVE FREE JOINT HEALTH ADVANCE PO), and TURMERIC  PO.  No orders of the defined types were placed in this encounter.   Medications Discontinued During This Encounter  Medication Reason  . FIBER PO Error  . mupirocin ointment (BACTROBAN) 2 % Error  . meclizine (ANTIVERT) 25 MG tablet Error  . albuterol (VENTOLIN HFA) 108 (90 Base) MCG/ACT inhaler Error  . chlorpheniramine-HYDROcodone (TUSSIONEX PENNKINETIC ER) 10-8 MG/5ML SUER Error  . ondansetron (ZOFRAN ODT) 4 MG disintegrating tablet Error    Follow-up: No follow-ups on file.   Crecencio Mc, MD

## 2019-10-09 NOTE — Assessment & Plan Note (Signed)
Biopsied from right upper arm in march, by Nehemiah Massed.  q 3 month follow up

## 2019-10-10 LAB — HEMOGLOBIN A1C: Hgb A1c MFr Bld: 5.5 % (ref 4.6–6.5)

## 2019-10-12 ENCOUNTER — Encounter: Payer: Self-pay | Admitting: Internal Medicine

## 2019-10-12 NOTE — Assessment & Plan Note (Signed)
HER ALT remains mildly elevated,  In the setting of fatty liver.  Will refer to GI

## 2019-10-12 NOTE — Assessment & Plan Note (Signed)

## 2019-10-12 NOTE — Assessment & Plan Note (Signed)
Infected in December 2020,  Recovered without hospitalization.  She has also received the  Vaccination

## 2019-10-12 NOTE — Assessment & Plan Note (Signed)
Her ALT remains elevated.  Will recommend GI referral due to persistent liver enzyme elevation

## 2019-10-13 ENCOUNTER — Ambulatory Visit (INDEPENDENT_AMBULATORY_CARE_PROVIDER_SITE_OTHER)
Admission: RE | Admit: 2019-10-13 | Discharge: 2019-10-13 | Disposition: A | Discharge status: Home / Self Care | Payer: 59 | Source: Ambulatory Visit

## 2019-10-13 DIAGNOSIS — M5442 Lumbago with sciatica, left side: Secondary | ICD-10-CM

## 2019-10-15 ENCOUNTER — Other Ambulatory Visit: Payer: Self-pay | Admitting: Internal Medicine

## 2019-10-15 DIAGNOSIS — R7401 Elevation of levels of liver transaminase levels: Secondary | ICD-10-CM

## 2019-10-15 DIAGNOSIS — K760 Fatty (change of) liver, not elsewhere classified: Secondary | ICD-10-CM

## 2019-10-15 DIAGNOSIS — H5203 Hypermetropia, bilateral: Secondary | ICD-10-CM | POA: Diagnosis not present

## 2019-11-13 ENCOUNTER — Encounter: Payer: Self-pay | Admitting: Dermatology

## 2019-11-13 ENCOUNTER — Other Ambulatory Visit: Payer: Self-pay

## 2019-11-13 ENCOUNTER — Ambulatory Visit: Payer: 59 | Admitting: Dermatology

## 2019-11-13 DIAGNOSIS — Z85820 Personal history of malignant melanoma of skin: Secondary | ICD-10-CM | POA: Diagnosis not present

## 2019-11-13 DIAGNOSIS — Z1283 Encounter for screening for malignant neoplasm of skin: Secondary | ICD-10-CM

## 2019-11-13 DIAGNOSIS — D485 Neoplasm of uncertain behavior of skin: Secondary | ICD-10-CM

## 2019-11-13 DIAGNOSIS — D1800 Hemangioma unspecified site: Secondary | ICD-10-CM | POA: Diagnosis not present

## 2019-11-13 DIAGNOSIS — L578 Other skin changes due to chronic exposure to nonionizing radiation: Secondary | ICD-10-CM

## 2019-11-13 DIAGNOSIS — D229 Melanocytic nevi, unspecified: Secondary | ICD-10-CM

## 2019-11-13 DIAGNOSIS — L570 Actinic keratosis: Secondary | ICD-10-CM | POA: Diagnosis not present

## 2019-11-13 DIAGNOSIS — L814 Other melanin hyperpigmentation: Secondary | ICD-10-CM | POA: Diagnosis not present

## 2019-11-13 DIAGNOSIS — Z86018 Personal history of other benign neoplasm: Secondary | ICD-10-CM

## 2019-11-13 DIAGNOSIS — L821 Other seborrheic keratosis: Secondary | ICD-10-CM

## 2019-11-13 NOTE — Progress Notes (Signed)
Follow-Up Visit   Subjective  Pamela Lara is a 59 y.o. female who presents for the following: Annual Exam (Hx of dysplastic nevi and MMIS ). The patient presents for Total-Body Skin Exam (TBSE) for skin cancer screening and mole check.  The following portions of the chart were reviewed this encounter and updated as appropriate:  Tobacco  Allergies  Meds  Problems  Med Hx  Surg Hx  Fam Hx     Review of Systems:  No other skin or systemic complaints except as noted in HPI or Assessment and Plan.  Objective  Well appearing patient in no apparent distress; mood and affect are within normal limits.  A full examination was performed including scalp, head, eyes, ears, nose, lips, neck, chest, axillae, abdomen, back, buttocks, bilateral upper extremities, bilateral lower extremities, hands, feet, fingers, toes, fingernails, and toenails. All findings within normal limits unless otherwise noted below.  Objective  numerous - see history: Scar with no evidence of recurrence.   Objective  R cheek/zygoma x 1, L forehead x 1 (2): Erythematous thin papules/macules with gritty scale.   Objective  L mid supraclavicular: 0.7 cm pink patch   Assessment & Plan    History of dysplastic nevus numerous - see history  Clear. Observe for recurrence. Call clinic for new or changing lesions.  Recommend regular skin exams, daily broad-spectrum spf 30+ sunscreen use, and photoprotection.     AK (actinic keratosis) (2) R cheek/zygoma x 1, L forehead x 1  Destruction of lesion - R cheek/zygoma x 1, L forehead x 1 Complexity: simple   Destruction method: cryotherapy   Informed consent: discussed and consent obtained   Timeout:  patient name, date of birth, surgical site, and procedure verified Lesion destroyed using liquid nitrogen: Yes   Region frozen until ice ball extended beyond lesion: Yes   Outcome: patient tolerated procedure well with no complications   Post-procedure  details: wound care instructions given    Neoplasm of uncertain behavior of skin L mid supraclavicular  Skin / nail biopsy Type of biopsy: tangential   Informed consent: discussed and consent obtained   Timeout: patient name, date of birth, surgical site, and procedure verified   Procedure prep:  Patient was prepped and draped in usual sterile fashion Prep type:  Isopropyl alcohol Anesthesia: the lesion was anesthetized in a standard fashion   Anesthetic:  1% lidocaine w/ epinephrine 1-100,000 buffered w/ 8.4% NaHCO3 Instrument used: flexible razor blade   Hemostasis achieved with: pressure, aluminum chloride and electrodesiccation   Outcome: patient tolerated procedure well   Post-procedure details: sterile dressing applied and wound care instructions given   Dressing type: bandage and petrolatum    Specimen 1 - Surgical pathology Differential Diagnosis: D48.5 r/o BCC Check Margins: No 0.7 cm pink patch  Skin cancer screening   Lentigines - Scattered tan macules - Discussed due to sun exposure - Benign, observe - Call for any changes  Seborrheic Keratoses - Stuck-on, waxy, tan-brown papules and plaques  - Discussed benign etiology and prognosis. - Observe - Call for any changes  Melanocytic Nevi - Tan-brown and/or pink-flesh-colored symmetric macules and papules - Benign appearing on exam today - Observation - Call clinic for new or changing moles - Recommend daily use of broad spectrum spf 30+ sunscreen to sun-exposed areas.   Hemangiomas - Red papules - Discussed benign nature - Observe - Call for any changes  Actinic Damage - diffuse scaly erythematous macules with underlying dyspigmentation - Recommend daily broad  spectrum sunscreen SPF 30+ to sun-exposed areas, reapply every 2 hours as needed.  - Call for new or changing lesions.  History of Melanoma - No evidence of recurrence today - No lymphadenopathy - Recommend regular full body skin exams -  Recommend daily broad spectrum sunscreen SPF 30+ to sun-exposed areas, reapply every 2 hours as needed.  - Call if any new or changing lesions are noted between office visits  Skin cancer screening performed today.  Return in about 6 months (around 05/15/2020) for TBSE.  Luther Redo, CMA, am acting as scribe for Sarina Ser, MD .  Documentation: I have reviewed the above documentation for accuracy and completeness, and I agree with the above.  Sarina Ser, MD

## 2019-11-13 NOTE — Patient Instructions (Signed)

## 2019-11-16 ENCOUNTER — Encounter: Payer: Self-pay | Admitting: Dermatology

## 2019-11-20 ENCOUNTER — Telehealth: Payer: Self-pay

## 2019-11-20 NOTE — Telephone Encounter (Signed)
-----   Message from Ralene Bathe, MD sent at 11/18/2019 11:55 AM EDT ----- Skin , left mid supraclavicular LICHENOID KERATOSIS  Benign inflamed keratosis If any remains, may treat with Ln2 freezing May schedule for this or may look at it next visit

## 2019-11-20 NOTE — Telephone Encounter (Signed)
Patient informed of results.  

## 2019-12-17 ENCOUNTER — Encounter: Payer: Self-pay | Admitting: Gastroenterology

## 2019-12-17 ENCOUNTER — Other Ambulatory Visit
Admission: RE | Admit: 2019-12-17 | Discharge: 2019-12-17 | Disposition: A | Discharge status: Home / Self Care | Payer: 59 | Attending: Gastroenterology | Admitting: Gastroenterology

## 2019-12-17 ENCOUNTER — Other Ambulatory Visit: Payer: Self-pay

## 2019-12-17 ENCOUNTER — Ambulatory Visit (INDEPENDENT_AMBULATORY_CARE_PROVIDER_SITE_OTHER): Payer: 59 | Admitting: Gastroenterology

## 2019-12-17 VITALS — BP 97/65 | HR 80 | Temp 97.3°F | Ht 66.0 in | Wt 178.4 lb

## 2019-12-17 DIAGNOSIS — R748 Abnormal levels of other serum enzymes: Secondary | ICD-10-CM | POA: Diagnosis not present

## 2019-12-17 LAB — HEPATIC FUNCTION PANEL
ALT: 53 U/L — ABNORMAL HIGH (ref 0–44)
AST: 29 U/L (ref 15–41)
Albumin: 4.5 g/dL (ref 3.5–5.0)
Alkaline Phosphatase: 64 U/L (ref 38–126)
Bilirubin, Direct: <0.1 mg/dL (ref 0.0–0.2)
Total Bilirubin: 0.5 mg/dL (ref 0.3–1.2)
Total Protein: 7.8 g/dL (ref 6.5–8.1)

## 2019-12-17 LAB — HEPATITIS B SURFACE ANTIBODY,QUALITATIVE: Hep B S Ab: REACTIVE — AB

## 2019-12-17 LAB — HEPATITIS C ANTIBODY: HCV Ab: NONREACTIVE

## 2019-12-17 LAB — HEPATITIS A ANTIBODY, TOTAL: hep A Total Ab: NONREACTIVE

## 2019-12-17 LAB — IRON AND TIBC
Iron: 116 ug/dL (ref 28–170)
Saturation Ratios: 29 % (ref 10.4–31.8)
TIBC: 403 ug/dL (ref 250–450)
UIBC: 287 ug/dL

## 2019-12-17 LAB — FERRITIN: Ferritin: 156 ng/mL (ref 11–307)

## 2019-12-17 LAB — HEPATITIS B SURFACE ANTIGEN: Hepatitis B Surface Ag: NONREACTIVE

## 2019-12-17 NOTE — Progress Notes (Signed)
Gastroenterology Consultation  Referring Provider:     Crecencio Mc, MD Primary Care Physician:  Crecencio Mc, MD Primary Gastroenterologist:  Dr. Allen Norris     Reason for Consultation:     Fatty liver and elevated liver enzymes        HPI:   Pamela Lara is a 59 y.o. y/o female referred for consultation & management of fatty liver and elevated liver enzymes by Dr. Derrel Nip, Aris Everts, MD.  This patient comes in today after seeing me back in 2018 for diverticulitis and underwent a colonoscopy that showed sigmoid diverticulosis.  The patient has now had chronically elevated ALT with the last time it being normal was in March 2019 and prior to that in May 2017. The patient reports that she had a sigmoidectomy for her diverticulitis in the past.  She does not drink on a regular basis but usually has social drinks on the weekend mostly insisting of wine.  There is no report of any jaundice abdominal pain nausea vomiting fevers or chills.  She does states she is always had a questionable stomach.  Past Medical History:  Diagnosis Date  . Arthritis    right shoulder  . BPPV (benign paroxysmal positional vertigo)   . Congenital bowed legs    s/p  surgical correction  . Diverticulitis, colon    recurrent  . Dysplastic nevus 09/09/2018   R mid post thigh  . Dysplastic nevus 07/09/2019   R buttock, L buttock, R lat popliteal  . Ectopic pregnancy 01/29/2000  . GERD (gastroesophageal reflux disease)    watch diet (as needed takes apple cider vinager)  . History of colonic diverticulitis    hx multiple acute diverticulitis episodes treated medically  . History of ectopic pregnancy   . History of Helicobacter pylori infection 2013  approx.  . Melanoma ([place]) 04/19/2018   R prox bicep (MMIS)  . PONV (postoperative nausea and vomiting)   . Seasonal affective disorder [facility])     Past Surgical History:  Procedure Laterality Date  . CHOLECYSTECTOMY OPEN  1984  . COLONOSCOPY WITH PROPOFOL  N/A 04/19/2017   Procedure: COLONOSCOPY WITH PROPOFOL;  Surgeon: Lucilla Lame, MD;  Location: [facility];  Service: Endoscopy;  Laterality: N/A;  . CYSTOSCOPY WITH STENT PLACEMENT Bilateral 08/15/2017   Procedure: CYSTOSCOPY WITH  FOLEY PLACEMENT WITH BILATERAL open ended catheter placement.  catheters removed at the end of the case.;  Surgeon: Lucas Mallow, MD;  Location: [facility];  Service: Urology;  Laterality: Bilateral;  . EXCISIONAL SENTINAL LYMPH NODE BIOPSY, NECK N/A 2016  approx.   per pt thyroid area , benign  . LAPAROSCOPIC SIGMOID COLECTOMY N/A 08/15/2017   Procedure: LAPAROSCOPIC SIGMOID COLECTOMY  ERAS PATHWAY;  Surgeon: Ileana Roup, MD;  Location: [facility];  Service: General;  Laterality: N/A;  . LAPAROSCOPY FOR ECTOPIC PREGNANCY  2001   also  BILATERAL TUBAL LIGATION  . OSTEOTOMY Bilateral 1980 and 1981   correction congenital bow legged  . TONSILLECTOMY      Prior to Admission medications   Medication Sig Start Date End Date Taking? Authorizing Provider  calcium carbonate (TUMS - DOSED IN MG ELEMENTAL CALCIUM) 500 MG chewable tablet Chew 1 tablet by mouth as needed for indigestion or heartburn.    [provider]  Cholecalciferol (VITAMIN D3) 1000 UNITS CAPS Take 1,000 Units by mouth daily.     [provider]  cyclobenzaprine (FLEXERIL) 10 MG tablet Take 1 tablet (10 mg  total) by mouth 2 (two) times daily as needed for muscle spasms. 08/10/19   Avegno, Darrelyn Hillock, FNP  fluticasone (FLONASE) 50 MCG/ACT nasal spray Place 2 sprays into both nostrils daily. 09/19/18   Darlin Priestly, PA-C  Glucos-Chond-Hyal Ac-Ca Fructo (MOVE FREE JOINT HEALTH ADVANCE PO)     [provider]  ibuprofen (ADVIL) 800 MG tablet Take 1 tablet (800 mg total) by mouth 3 (three) times daily. 08/10/19   Avegno, Darrelyn Hillock, FNP  Korean Ginseng 1000 MG TABS Take 1,000 mg by mouth daily.     [provider]  Multiple Vitamin (MULTIVITAMIN) tablet Take 1  tablet by mouth daily.    [provider]  Omega-3 Fatty Acids (FISH OIL) 1000 MG CAPS Take 1,000 mg by mouth daily.     [provider]  predniSONE (STERAPRED UNI-PAK 21 TAB) 10 MG (21) TBPK tablet Take 6 tabs by mouth daily  for 2 days, then 5 tabs for 2 days, then 4 tabs for 2 days, then 3 tabs for 2 days, 2 tabs for 2 days, then 1 tab by mouth daily for 2 days 08/10/19   Avegno, Darrelyn Hillock, FNP  Psyllium (METAMUCIL FIBER PO) Take by mouth.    [provider]  TURMERIC PO Take 2,250 mg by mouth 2 (two) times daily.    [provider]  ZINC-VITAMIN C PO Take 1 tablet by mouth daily.    [provider]    Family History  Problem Relation Age of Onset  . Cancer Mother   . Arthritis Mother   . Hypertension Mother   . Cancer Father 37       Mesothelioma  . Arthritis Father   . Hypertension Father   . Breast cancer Neg Hx      Social History   Tobacco Use  . Smoking status: Former Smoker    Packs/day: 0.25    Years: 38.00    Pack years: 9.50    Types: Cigarettes    Quit date: 06/28/2017    Years since quitting: 2.4  . Smokeless tobacco: Never Used  . Tobacco comment: since age 17/  07-27-2017 per pt quit smoking 02/ 28/ 2019 cold [place]  Vaping Use  . Vaping Use: Never used  Substance Use Topics  . Alcohol use: Yes    Comment: occasional  . Drug use: No    Allergies as of 12/17/2019 - Review Complete 11/16/2019  Allergen Reaction Noted  . Ivp dye [iodinated diagnostic agents] Shortness Of Breath 12/21/2010  . Morphine and related Nausea And Vomiting 12/21/2010  . Other  08/15/2017  . Wellbutrin [bupropion] Other (See Comments) 04/24/2014    Review of Systems:    All systems reviewed and negative except where noted in HPI.   Physical Exam:  LMP 10/03/2015 (Approximate)  Patient's last menstrual period was 10/03/2015 (approximate). General:   Alert,  Well-developed, well-nourished, pleasant and cooperative in NAD Head:   Normocephalic and atraumatic. Eyes:  Sclera clear, no icterus.   Conjunctiva pink. Ears:  Normal auditory acuity. Neck:  Supple; no masses or thyromegaly. Lungs:  Respirations even and unlabored.  Clear throughout to auscultation.   No wheezes, crackles, or rhonchi. No acute distress. Heart:  Regular rate and rhythm; no murmurs, clicks, rubs, or gallops. Abdomen:  Normal bowel sounds.  No bruits.  Soft, non-tender and non-distended without masses, hepatosplenomegaly or hernias noted.  No guarding or rebound tenderness.  Negative Carnett sign.   Rectal:  Deferred.  Extremities:  No clubbing  or edema.  No cyanosis. Neurologic:  Alert and oriented x3;  grossly normal neurologically. Skin:  Intact without significant lesions or rashes.  No jaundice. Psych:  Alert and cooperative. Normal mood and affect.  Imaging Studies: No results found.  Assessment and Plan:   Pamela Lara is a 59 y.o. y/o female who comes in today with abnormal liver enzymes most likely consistent with fatty liver which was seen on her CT scan in the past.  The patient has no issues at the present time and has had only a isolated ALT.  The patient will have her labs checked for other possible causes of abnormal liver enzymes.  The patient will continue to try and lose weight and has contemplated cutting down on alcohol.  The patient will have her hepatitis A and hepatitis B vaccination status investigated with antibodies and she will be vaccinated accordingly.  The patient had hepatitis A and B antibodies checked in 2019 and they were both were negative.  If they continue to be negative the patient will need a vaccination for both.  The patient has been explained the plan agrees with it.    Lucilla Lame, MD. Marval Regal    Note: This dictation was prepared with Dragon dictation along with smaller phrase technology. Any transcriptional errors that result from this process are unintentional.

## 2019-12-18 LAB — ANTI-SMOOTH MUSCLE ANTIBODY, IGG: F-Actin IgG: 8 Units (ref 0–19)

## 2019-12-18 LAB — ANA: Anti Nuclear Antibody (ANA): NEGATIVE

## 2019-12-18 LAB — MITOCHONDRIAL ANTIBODIES: Mitochondrial M2 Ab, IgG: <20 Units (ref 0.0–20.0)

## 2019-12-18 LAB — ALPHA-1-ANTITRYPSIN: A-1 Antitrypsin, Ser: 109 mg/dL (ref 101–187)

## 2019-12-26 ENCOUNTER — Telehealth: Payer: Self-pay

## 2019-12-26 NOTE — Telephone Encounter (Signed)
-----   Message from Lucilla Lame, MD sent at 12/22/2019 12:30 PM EDT ----- Let the patient know that she needs a vaccination for hepatitis A but not for hepatitis B.  Her ALT is still slightly elevated.  I recommend decreasing her BMI thereby taking out the fat from the liver.

## 2019-12-26 NOTE — Telephone Encounter (Signed)
Pt notified of lab results via mychart. 

## 2020-06-03 ENCOUNTER — Encounter: Payer: 59 | Admitting: Dermatology

## 2020-06-18 ENCOUNTER — Ambulatory Visit: Payer: 59 | Attending: Internal Medicine

## 2020-06-18 ENCOUNTER — Other Ambulatory Visit (HOSPITAL_COMMUNITY): Payer: Self-pay | Admitting: Internal Medicine

## 2020-06-18 DIAGNOSIS — Z23 Encounter for immunization: Secondary | ICD-10-CM

## 2020-06-18 NOTE — Progress Notes (Signed)
   Covid-19 Vaccination Clinic  Name:  Pamela Lara    MRN: [id number] DOB: [birth year]  06/18/2020  Pamela Lara was observed post Covid-19 immunization for 15 minutes without incident. She was provided with Vaccine Information Sheet and instruction to access the V-Safe system.   Pamela Lara was instructed to call 911 with any severe reactions post vaccine: Marland Kitchen Difficulty breathing  . Swelling of face and throat  . A fast heartbeat  . A bad rash all over body  . Dizziness and weakness   Immunizations Administered    Name Date Dose VIS Date Route   PFIZER Comrnaty(Gray TOP) Covid-19 Vaccine 06/18/2020 11:22 AM 0.3 mL 04/08/2020 Intramuscular   Manufacturer: [place]   Lot: [id number]   NDC: [phone]

## 2020-08-12 ENCOUNTER — Encounter: Payer: 59 | Admitting: Dermatology

## 2020-09-07 ENCOUNTER — Encounter: Payer: 59 | Admitting: Dermatology

## 2020-09-10 ENCOUNTER — Other Ambulatory Visit: Payer: Self-pay | Admitting: Internal Medicine

## 2020-09-10 DIAGNOSIS — Z1231 Encounter for screening mammogram for malignant neoplasm of breast: Secondary | ICD-10-CM

## 2020-09-14 ENCOUNTER — Ambulatory Visit
Admission: RE | Admit: 2020-09-14 | Discharge: 2020-09-14 | Disposition: A | Discharge status: Home / Self Care | Payer: 59 | Source: Ambulatory Visit | Attending: Internal Medicine | Admitting: Internal Medicine

## 2020-09-14 ENCOUNTER — Other Ambulatory Visit: Payer: Self-pay

## 2020-09-14 DIAGNOSIS — Z1231 Encounter for screening mammogram for malignant neoplasm of breast: Secondary | ICD-10-CM | POA: Insufficient documentation

## 2020-09-15 ENCOUNTER — Telehealth: Payer: Self-pay | Admitting: Internal Medicine

## 2020-09-15 DIAGNOSIS — K760 Fatty (change of) liver, not elsewhere classified: Secondary | ICD-10-CM

## 2020-09-15 NOTE — Telephone Encounter (Signed)
Patient has a cpe on 09/21/2020. She would like to know if Dr Derrel Nip would like labs done before her appointment. If she does she will do labs at [facility]

## 2020-09-15 NOTE — Telephone Encounter (Signed)
She needs a fasting lipid panel and a CMET. . I have ordered them to be done at [facility] lab

## 2020-09-15 NOTE — Telephone Encounter (Signed)
Unable to leave message for patient to return call back. Phone kept ringing.  

## 2020-09-20 ENCOUNTER — Other Ambulatory Visit
Admission: RE | Admit: 2020-09-20 | Discharge: 2020-09-20 | Disposition: A | Discharge status: Home / Self Care | Payer: 59 | Source: Ambulatory Visit | Attending: Internal Medicine | Admitting: Internal Medicine

## 2020-09-20 DIAGNOSIS — K760 Fatty (change of) liver, not elsewhere classified: Secondary | ICD-10-CM | POA: Insufficient documentation

## 2020-09-20 LAB — COMPREHENSIVE METABOLIC PANEL
ALT: 59 U/L — ABNORMAL HIGH (ref 0–44)
AST: 32 U/L (ref 15–41)
Albumin: 4.2 g/dL (ref 3.5–5.0)
Alkaline Phosphatase: 55 U/L (ref 38–126)
Anion gap: 11 (ref 5–15)
BUN: 12 mg/dL (ref 6–20)
CO2: 21 mmol/L — ABNORMAL LOW (ref 22–32)
Calcium: 9 mg/dL (ref 8.9–10.3)
Chloride: 106 mmol/L (ref 98–111)
Creatinine, Ser: 0.8 mg/dL (ref 0.44–1.00)
GFR, Estimated: >60 mL/min (ref >60)
Glucose, Bld: 115 mg/dL — ABNORMAL HIGH (ref 70–99)
Potassium: 4 mmol/L (ref 3.5–5.1)
Sodium: 138 mmol/L (ref 135–145)
Total Bilirubin: 0.9 mg/dL (ref 0.3–1.2)
Total Protein: 7.4 g/dL (ref 6.5–8.1)

## 2020-09-20 LAB — LIPID PANEL
Cholesterol: 222 mg/dL — ABNORMAL HIGH (ref 0–200)
HDL: 51 mg/dL (ref >40)
LDL Cholesterol: 114 mg/dL — ABNORMAL HIGH (ref 0–99)
Total CHOL/HDL Ratio: 4.4 RATIO
Triglycerides: 285 mg/dL — ABNORMAL HIGH (ref <150)
VLDL: 57 mg/dL — ABNORMAL HIGH (ref 0–40)

## 2020-09-20 NOTE — Telephone Encounter (Signed)
Unable to leave message for patient to return call back. Phone kept ringing.  

## 2020-09-20 NOTE — Telephone Encounter (Signed)
Labs were done today.

## 2020-09-21 ENCOUNTER — Other Ambulatory Visit: Payer: Self-pay

## 2020-09-21 ENCOUNTER — Other Ambulatory Visit (HOSPITAL_COMMUNITY)
Admission: RE | Admit: 2020-09-21 | Discharge: 2020-09-21 | Disposition: A | Discharge status: Home / Self Care | Payer: 59 | Source: Ambulatory Visit | Attending: Internal Medicine | Admitting: Internal Medicine

## 2020-09-21 ENCOUNTER — Encounter: Payer: Self-pay | Admitting: Internal Medicine

## 2020-09-21 ENCOUNTER — Ambulatory Visit (INDEPENDENT_AMBULATORY_CARE_PROVIDER_SITE_OTHER): Payer: 59 | Admitting: Internal Medicine

## 2020-09-21 VITALS — BP 122/80 | HR 70 | Temp 96.6°F | Resp 14 | Ht 66.0 in | Wt 181.4 lb

## 2020-09-21 DIAGNOSIS — R7301 Impaired fasting glucose: Secondary | ICD-10-CM | POA: Diagnosis not present

## 2020-09-21 DIAGNOSIS — Z124 Encounter for screening for malignant neoplasm of cervix: Secondary | ICD-10-CM | POA: Diagnosis not present

## 2020-09-21 DIAGNOSIS — Z0000 Encounter for general adult medical examination without abnormal findings: Secondary | ICD-10-CM

## 2020-09-21 DIAGNOSIS — E781 Pure hyperglyceridemia: Secondary | ICD-10-CM | POA: Diagnosis not present

## 2020-09-21 LAB — POCT GLYCOSYLATED HEMOGLOBIN (HGB A1C): Hemoglobin A1C: 5.3 % (ref 4.0–5.6)

## 2020-09-21 NOTE — Patient Instructions (Addendum)
your triglycerides are elevated,  which is raising your total cholesterol .  This should improve with a low glycemic index diet and Exercise . e should repeat in 6 months.       Health Maintenance for Postmenopausal Women Menopause is a normal process in which your ability to get pregnant comes to an end. This process happens slowly over many months or years, usually between the ages of 56 and 29. Menopause is complete when you have missed your menstrual periods for 12 months. It is important to talk with your health care provider about some of the most common conditions that affect women after menopause (postmenopausal women). These include heart disease, cancer, and bone loss (osteoporosis). Adopting a healthy lifestyle and getting preventive care can help to promote your health and wellness. The actions you take can also lower your chances of developing some of these common conditions. What should I know about menopause? During menopause, you may get a number of symptoms, such as:  Hot flashes. These can be moderate or severe.  Night sweats.  Decrease in sex drive.  Mood swings.  Headaches.  Tiredness.  Irritability.  Memory problems.  Insomnia. Choosing to treat or not to treat these symptoms is a decision that you make with your health care provider. Do I need hormone replacement therapy?  Hormone replacement therapy is effective in treating symptoms that are caused by menopause, such as hot flashes and night sweats.  Hormone replacement carries certain risks, especially as you become older. If you are thinking about using estrogen or estrogen with progestin, discuss the benefits and risks with your health care provider. What is my risk for heart disease and stroke? The risk of heart disease, heart attack, and stroke increases as you age. One of the causes may be a change in the body's hormones during menopause. This can affect how your body uses dietary fats, triglycerides,  and cholesterol. Heart attack and stroke are medical emergencies. There are many things that you can do to help prevent heart disease and stroke. Watch your blood pressure  High blood pressure causes heart disease and increases the risk of stroke. This is more likely to develop in people who have high blood pressure readings, are of African descent, or are overweight.  Have your blood pressure checked: ? Every 3-5 years if you are 51-52 years of age. ? Every year if you are 78 years old or older. Eat a healthy diet  Eat a diet that includes plenty of vegetables, fruits, low-fat dairy products, and lean protein.  Do not eat a lot of foods that are high in solid fats, added sugars, or sodium.   Get regular exercise Get regular exercise. This is one of the most important things you can do for your health. Most adults should:  Try to exercise for at least 150 minutes each week. The exercise should increase your heart rate and make you sweat (moderate-intensity exercise).  Try to do strengthening exercises at least twice each week. Do these in addition to the moderate-intensity exercise.  Spend less time sitting. Even light physical activity can be beneficial. Other tips  Work with your health care provider to achieve or maintain a healthy weight.  Do not use any products that contain nicotine or tobacco, such as cigarettes, e-cigarettes, and chewing tobacco. If you need help quitting, ask your health care provider.  Know your numbers. Ask your health care provider to check your cholesterol and your blood sugar (glucose). Continue to have  your blood tested as directed by your health care provider. Do I need screening for cancer? Depending on your health history and family history, you may need to have cancer screening at different stages of your life. This may include screening for:  Breast cancer.  Cervical cancer.  Lung cancer.  Colorectal cancer. What is my risk for  osteoporosis? After menopause, you may be at increased risk for osteoporosis. Osteoporosis is a condition in which bone destruction happens more quickly than new bone creation. To help prevent osteoporosis or the bone fractures that can happen because of osteoporosis, you may take the following actions:  If you are 107-71 years old, get at least 1,000 mg of calcium and at least 600 mg of vitamin D per day.  If you are older than age 61 but younger than age 64, get at least 1,200 mg of calcium and at least 600 mg of vitamin D per day.  If you are older than age 46, get at least 1,200 mg of calcium and at least 800 mg of vitamin D per day. Smoking and drinking excessive alcohol increase the risk of osteoporosis. Eat foods that are rich in calcium and vitamin D, and do weight-bearing exercises several times each week as directed by your health care provider. How does menopause affect my mental health? Depression may occur at any age, but it is more common as you become older. Common symptoms of depression include:  Low or sad mood.  Changes in sleep patterns.  Changes in appetite or eating patterns.  Feeling an overall lack of motivation or enjoyment of activities that you previously enjoyed.  Frequent crying spells. Talk with your health care provider if you think that you are experiencing depression. General instructions See your health care provider for regular wellness exams and vaccines. This may include:  Scheduling regular health, dental, and eye exams.  Getting and maintaining your vaccines. These include: ? Influenza vaccine. Get this vaccine each year before the flu season begins. ? Pneumonia vaccine. ? Shingles vaccine. ? Tetanus, diphtheria, and pertussis (Tdap) booster vaccine. Your health care provider may also recommend other immunizations. Tell your health care provider if you have ever been abused or do not feel safe at home. Summary  Menopause is a normal process in  which your ability to get pregnant comes to an end.  This condition causes hot flashes, night sweats, decreased interest in sex, mood swings, headaches, or lack of sleep.  Treatment for this condition may include hormone replacement therapy.  Take actions to keep yourself healthy, including exercising regularly, eating a healthy diet, watching your weight, and checking your blood pressure and blood sugar levels.  Get screened for cancer and depression. Make sure that you are up to date with all your vaccines. This information is not intended to replace advice given to you by your health care provider. Make sure you discuss any questions you have with your health care provider. Document Revised: 04/10/2018 Document Reviewed: 04/10/2018 Elsevier Patient Education  2021 [employer].

## 2020-09-21 NOTE — Progress Notes (Signed)
Patient ID: Pamela Lara, female    DOB: 06/02/1960  Age: 60 y.o. MRN: [id number]  The patient is here for annual preventive examination and management of other chronic and acute problems.   The risk factors are reflected in the social history.  The roster of all physicians providing medical care to patient - is listed in the Snapshot section of the chart.  Activities of daily living:  The patient is 100% independent in all ADLs: dressing, toileting, feeding as well as independent mobility  Home safety : The patient has smoke detectors in the home. They wear seatbelts.  There are no firearms at home. There is no violence in the home.  There is no risks for hepatitis, STDs or HIV. There is no   history of blood transfusion. They have no travel history to infectious disease endemic areas of the world.  The patient has seen their dentist in the last six month. They have seen their eye doctor in the last year. She denies  hearing difficulty with regard to whispered voices and some television programs.  They have deferred audiologic testing in the last year.  They do not  have excessive sun exposure. Discussed the need for sun protection: hats, long sleeves and use of sunscreen if there is significant sun exposure.   Diet: the importance of a healthy diet is discussed. They do have a healthy diet.  The benefits of regular aerobic exercise were discussed. She walks 4 times per week ,  20 minutes.   Depression screen: there are no signs or vegative symptoms of depression- irritability, change in appetite, anhedonia, sadness/tearfullness.  The following portions of the patient's history were reviewed and updated as appropriate: allergies, current medications, past family history, past medical history,  past surgical history, past social history  and problem list.  Visual acuity was not assessed per patient preference since she has regular follow up with her ophthalmologist. Hearing and body mass  index were assessed and reviewed.   During the course of the visit the patient was educated and counseled about appropriate screening and preventive services including : fall prevention , diabetes screening, nutrition counseling, colorectal cancer screening, and recommended immunizations.    CC: The primary encounter diagnosis was Cervical cancer screening. Diagnoses of Impaired fasting glucose, Encounter for preventive health examination, and Hypertriglyceridemia without hypercholesterolemia were also pertinent to this visit.  No issues.   History Rainy has a past medical history of Arthritis, BPPV (benign paroxysmal positional vertigo), Congenital bowed legs, Diverticulitis, colon, Dysplastic nevus (09/09/2018), Dysplastic nevus (07/09/2019), Ectopic pregnancy (01/29/2000), GERD (gastroesophageal reflux disease), History of colonic diverticulitis, History of ectopic pregnancy, History of Helicobacter pylori infection (2013  approx.), Melanoma ([place]) (04/19/2018), PONV (postoperative nausea and vomiting), and Seasonal affective disorder ([place]).   She has a past surgical history that includes Colonoscopy with propofol (N/A, 04/19/2017); Cholecystectomy open (1984); Tonsillectomy; Laparoscopy for ectopic pregnancy (2001); Osteotomy (Bilateral, 1980 and 1981); EXCISIONAL SENTINAL LYMPH NODE BIOPSY, NECK (N/A, 2016  approx.); Laparoscopic sigmoid colectomy (N/A, 08/15/2017); and Cystoscopy with stent placement (Bilateral, 08/15/2017).   Her family history includes Arthritis in her father and mother; Cancer in her mother; Cancer (age of onset: 38) in her father; Hypertension in her father and mother.She reports that she quit smoking about 3 years ago. Her smoking use included cigarettes. She has a 9.50 pack-year smoking history. She has never used smokeless tobacco. She reports current alcohol use. She reports that she does not use drugs.  Outpatient Medications Prior to Visit  Medication Sig Dispense Refill   . [place] Ginseng 1000 MG TABS Take 1,000 mg by mouth daily.     . Multiple Vitamin (MULTIVITAMIN) tablet Take 1 tablet by mouth daily.    . Omega-3 Fatty Acids (FISH OIL) 1000 MG CAPS Take 1,000 mg by mouth daily.     Marland Kitchen COVID-19 mRNA Vac-TriS, Pfizer, SUSP injection USE AS DIRECTED .3 mL 0  . calcium carbonate (TUMS - DOSED IN MG ELEMENTAL CALCIUM) 500 MG chewable tablet Chew 1 tablet by mouth as needed for indigestion or heartburn. (Patient not taking: Reported on 09/21/2020)    . Cholecalciferol (VITAMIN D3) 1000 UNITS CAPS Take 1,000 Units by mouth daily.  (Patient not taking: Reported on 09/21/2020)    . Psyllium (METAMUCIL FIBER PO) Take by mouth. (Patient not taking: Reported on 09/21/2020)    . Glucos-Chond-Hyal Ac-Ca Fructo (MOVE FREE JOINT HEALTH ADVANCE PO)     . ibuprofen (ADVIL) 800 MG tablet Take 1 tablet (800 mg total) by mouth 3 (three) times daily. 30 tablet 0  . TURMERIC PO Take 2,250 mg by mouth 2 (two) times daily.     No facility-administered medications prior to visit.    Review of Systems   Patient denies headache, fevers, malaise, unintentional weight loss, skin rash, eye pain, sinus congestion and sinus pain, sore throat, dysphagia,  hemoptysis , cough, dyspnea, wheezing, chest pain, palpitations, orthopnea, edema, abdominal pain, nausea, melena, diarrhea, constipation, flank pain, dysuria, hematuria, urinary  Frequency, nocturia, numbness, tingling, seizures,  Focal weakness, Loss of consciousness,  Tremor, insomnia, depression, anxiety, and suicidal ideation.     Objective:  BP 122/80 (BP Location: Left Arm, Patient Position: Sitting, Cuff Size: Normal)   Pulse 70   Temp (!) 96.6 F (35.9 C) (Temporal)   Resp 14   Ht 5\' 6"  (1.676 m)   Wt 181 lb 6.4 oz (82.3 kg)   LMP 10/03/2015 (Approximate)   SpO2 98%   BMI 29.28 kg/m   Physical Exam  General Appearance:    Alert, cooperative, no distress, appears stated age  Head:    Normocephalic, without obvious  abnormality, atraumatic  Eyes:    PERRL, conjunctiva/corneas clear, EOM's intact, fundi    benign, both eyes  Ears:    Normal TM's and external ear canals, both ears  Nose:   Nares normal, septum midline, mucosa normal, no drainage    or sinus tenderness  Throat:   Lips, mucosa, and tongue normal; teeth and gums normal  Neck:   Supple, symmetrical, trachea midline, no adenopathy;    thyroid:  no enlargement/tenderness/nodules; no carotid   bruit or JVD  Back:     Symmetric, no curvature, ROM normal, no CVA tenderness  Lungs:     Clear to auscultation bilaterally, respirations unlabored  Chest Wall:    No tenderness or deformity   Heart:    Regular rate and rhythm, S1 and S2 normal, no murmur, rub   or gallop  Breast Exam:    No tenderness, masses, or nipple abnormality  Abdomen:     Soft, non-tender, bowel sounds active all four quadrants,    no masses, no organomegaly  Genitalia:    Pelvic: cervix normal in appearance, external genitalia normal, no adnexal masses or tenderness, no cervical motion tenderness, rectovaginal septum normal, uterus normal size, shape, and consistency and vagina normal without discharge  Extremities:   Extremities normal, atraumatic, no cyanosis or edema  Pulses:   2+ and symmetric all extremities  Skin:   Skin  color, texture, turgor normal, no rashes or lesions  Lymph nodes:   Cervical, supraclavicular, and axillary nodes normal  Neurologic:   CNII-XII intact, normal strength, sensation and reflexes    throughout    Assessment & Plan:   Problem List Items Addressed This Visit      Unprioritized   Cervical cancer screening - Primary   Relevant Orders   Cytology - PAP   Encounter for preventive health examination    age appropriate education and counseling updated, referrals for preventative services and immunizations addressed, dietary and smoking counseling addressed, most recent labs reviewed.  I have personally reviewed and have noted:  1) the  patient's medical and social history 2) The pt's use of alcohol, tobacco, and illicit drugs 3) The patient's current medications and supplements 4) Functional ability including ADL's, fall risk, home safety risk, hearing and visual impairment 5) Diet and physical activities 6) Evidence for depression or mood disorder 7) The patient's height, weight, and BMI have been recorded in the chart  I have made referrals, and provided counseling and education based on review of the above      Hypertriglyceridemia without hypercholesterolemia    I have addressed lipids and  BMI and recommended wt loss of 10% of body weight over the next 6 months using a low glycemic index diet and regular exercise a minimum of 5 days per week.         Other Visit Diagnoses    Impaired fasting glucose       Relevant Orders   POCT HgB A1C (Completed)      I have discontinued Shikara N. Depree's ibuprofen, Glucos-Chond-Hyal Ac-Ca Fructo (MOVE FREE JOINT HEALTH ADVANCE PO), TURMERIC PO, and COVID-19 mRNA Vac-TriS [employer]). I am also having her maintain her Vitamin D3, Fish Oil, Korean Ginseng, calcium carbonate, Psyllium (METAMUCIL FIBER PO), and multivitamin.  No orders of the defined types were placed in this encounter.   Medications Discontinued During This Encounter  Medication Reason  . Glucos-Chond-Hyal Ac-Ca Fructo (MOVE FREE JOINT HEALTH ADVANCE PO)   . ibuprofen (ADVIL) 800 MG tablet   . TURMERIC PO   . COVID-19 mRNA Vac-TriS, Pfizer, SUSP injection Error    Follow-up: No follow-ups on file.   Crecencio Mc, MD

## 2020-09-22 DIAGNOSIS — E781 Pure hyperglyceridemia: Secondary | ICD-10-CM | POA: Insufficient documentation

## 2020-09-22 NOTE — Assessment & Plan Note (Signed)
I have addressed lipids and  BMI and recommended wt loss of 10% of body weight over the next 6 months using a low glycemic index diet and regular exercise a minimum of 5 days per week.

## 2020-09-22 NOTE — Assessment & Plan Note (Signed)

## 2020-09-24 LAB — CYTOLOGY - PAP
Comment: NEGATIVE
Diagnosis: NEGATIVE
High risk HPV: NEGATIVE

## 2020-10-29 ENCOUNTER — Other Ambulatory Visit: Payer: Self-pay

## 2020-10-29 MED ORDER — CARESTART COVID-19 HOME TEST VI KIT
PACK | 0 refills | Status: DC
  Filled 2020-10-29: qty 4, 4d supply, fill #0

## 2020-11-23 ENCOUNTER — Other Ambulatory Visit: Payer: Self-pay

## 2020-11-23 MED ORDER — CARESTART COVID-19 HOME TEST VI KIT
PACK | 0 refills | Status: DC
  Filled 2020-11-23: qty 2, 4d supply, fill #0

## 2020-12-14 DIAGNOSIS — H5203 Hypermetropia, bilateral: Secondary | ICD-10-CM | POA: Diagnosis not present

## 2020-12-23 ENCOUNTER — Encounter: Payer: 59 | Admitting: Dermatology

## 2020-12-28 ENCOUNTER — Other Ambulatory Visit: Payer: Self-pay

## 2020-12-28 MED ORDER — CARESTART COVID-19 HOME TEST VI KIT
PACK | 0 refills | Status: DC
  Filled 2020-12-28: qty 2, 4d supply, fill #0

## 2020-12-31 ENCOUNTER — Other Ambulatory Visit: Payer: Self-pay

## 2020-12-31 MED ORDER — CARESTART COVID-19 HOME TEST VI KIT
PACK | 0 refills | Status: DC
  Filled 2020-12-31: qty 2, 4d supply, fill #0

## 2021-02-14 ENCOUNTER — Other Ambulatory Visit: Payer: Self-pay

## 2021-02-14 MED ORDER — SCOPOLAMINE 1 MG/3DAYS TD PT72
1.0000 | MEDICATED_PATCH | TRANSDERMAL | 0 refills | Status: DC
  Filled 2021-02-14: qty 4, 12d supply, fill #0

## 2021-05-05 ENCOUNTER — Encounter: Payer: Self-pay | Admitting: Dermatology

## 2021-05-05 ENCOUNTER — Ambulatory Visit: Payer: 59 | Admitting: Dermatology

## 2021-05-05 ENCOUNTER — Other Ambulatory Visit: Payer: Self-pay

## 2021-05-05 DIAGNOSIS — Z86018 Personal history of other benign neoplasm: Secondary | ICD-10-CM

## 2021-05-05 DIAGNOSIS — Z1283 Encounter for screening for malignant neoplasm of skin: Secondary | ICD-10-CM | POA: Diagnosis not present

## 2021-05-05 DIAGNOSIS — L814 Other melanin hyperpigmentation: Secondary | ICD-10-CM

## 2021-05-05 DIAGNOSIS — L578 Other skin changes due to chronic exposure to nonionizing radiation: Secondary | ICD-10-CM | POA: Diagnosis not present

## 2021-05-05 DIAGNOSIS — D1801 Hemangioma of skin and subcutaneous tissue: Secondary | ICD-10-CM

## 2021-05-05 DIAGNOSIS — D2272 Melanocytic nevi of left lower limb, including hip: Secondary | ICD-10-CM

## 2021-05-05 DIAGNOSIS — L821 Other seborrheic keratosis: Secondary | ICD-10-CM

## 2021-05-05 DIAGNOSIS — Z86006 Personal history of melanoma in-situ: Secondary | ICD-10-CM

## 2021-05-05 DIAGNOSIS — D229 Melanocytic nevi, unspecified: Secondary | ICD-10-CM

## 2021-05-05 NOTE — Patient Instructions (Signed)

## 2021-05-05 NOTE — Progress Notes (Signed)
Follow-Up Visit   Subjective  Pamela Lara is a 61 y.o. female who presents for the following: Total body skin exam (Hx of Melanoma IS R prox bicep, hx of Dysplastic Nevi) and check spot (Scalp, 60m, scaly). The patient presents for Total-Body Skin Exam (TBSE) for skin cancer screening and mole check.  The patient has spots, moles and lesions to be evaluated, some may be new or changing and the patient has concerns that these could be cancer.   The following portions of the chart were reviewed this encounter and updated as appropriate:   Tobacco   Allergies   Meds   Problems   Med Hx   Surg Hx   Fam Hx      Review of Systems:  No other skin or systemic complaints except as noted in HPI or Assessment and Plan.  Objective  Well appearing patient in no apparent distress; mood and affect are within normal limits.  A full examination was performed including scalp, head, eyes, ears, nose, lips, neck, chest, axillae, abdomen, back, buttocks, bilateral upper extremities, bilateral lower extremities, hands, feet, fingers, toes, fingernails, and toenails. All findings within normal limits unless otherwise noted below.  R prox bicep Well healed scar with no evidence of recurrence, no lymphadenopathy.   Left Foot - Anterior 3.16mm regular brown macule         Assessment & Plan   Lentigines - Scattered tan macules - Due to sun exposure - Benign-appearing, observe - Recommend daily broad spectrum sunscreen SPF 30+ to sun-exposed areas, reapply every 2 hours as needed. - Call for any changes  Seborrheic Keratoses - Stuck-on, waxy, tan-brown papules and/or plaques  - Benign-appearing - Discussed benign etiology and prognosis. - Observe - Call for any changes  Melanocytic Nevi - Tan-brown and/or pink-flesh-colored symmetric macules and papules - Benign appearing on exam today - Observation - Call clinic for new or changing moles - Recommend daily use of broad spectrum spf 30+  sunscreen to sun-exposed areas.   Hemangiomas - Red papules - Discussed benign nature - Observe - Call for any changes  Actinic Damage - Chronic condition, secondary to cumulative UV/sun exposure - diffuse scaly erythematous macules with underlying dyspigmentation - Recommend daily broad spectrum sunscreen SPF 30+ to sun-exposed areas, reapply every 2 hours as needed.  - Staying in the shade or wearing long sleeves, sun glasses (UVA+UVB protection) and wide brim hats (4-inch brim around the entire circumference of the hat) are also recommended for sun protection.  - Call for new or changing lesions.  Skin cancer screening performed today.  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 Observe for recurrence. Call clinic for new or changing lesions.  Recommend regular skin exams, daily broad-spectrum spf 30+ sunscreen use, and photoprotection.    Nevus Left Foot - little toe See photo Benign-appearing.  Observation.  Call clinic for new or changing lesions.  Recommend daily use of broad spectrum spf 30+ sunscreen to sun-exposed areas.   Skin cancer screening  History of Dysplastic Nevi - No evidence of recurrence today - Recommend regular full body skin exams - Recommend daily broad spectrum sunscreen SPF 30+ to sun-exposed areas, reapply every 2 hours as needed.  - Call if any new or changing lesions are noted between office visits  -R mid post thigh, R buttock, L buttock, R lat popliteal  Return in about 1 year (around 05/05/2022) for TBSE, Hx of Melanoma IS, Hx of Dysplastic nevi.  I, Davy Pique  Hupman, RMA, am acting as scribe for Sarina Ser, MD . Documentation: I have reviewed the above documentation for accuracy and completeness, and I agree with the above.  Sarina Ser, MD

## 2021-05-06 ENCOUNTER — Ambulatory Visit: Payer: 59 | Attending: Internal Medicine

## 2021-05-06 ENCOUNTER — Other Ambulatory Visit: Payer: Self-pay

## 2021-05-06 DIAGNOSIS — Z23 Encounter for immunization: Secondary | ICD-10-CM

## 2021-05-06 MED ORDER — PFIZER COVID-19 VAC BIVALENT 30 MCG/0.3ML IM SUSP
INTRAMUSCULAR | 0 refills | Status: DC
  Filled 2021-05-06: qty 0.3, 1d supply, fill #0

## 2021-05-06 NOTE — Progress Notes (Signed)
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 She was provided with Vaccine Information Sheet and instruction to access the V-Safe system.   [name] was instructed to call 911 with any severe reactions post vaccine: Difficulty breathing  Swelling of face and throat  A fast heartbeat  A bad rash all over body  Dizziness and weakness   Immunizations Administered     Name Date Dose VIS Date Route   Pfizer Covid-19 Vaccine Bivalent Booster 05/06/2021  2:43 PM 0.3 mL 12/29/2020 Intramuscular   Manufacturer: [place]   Lot: [id number]   Josephine: [place], PharmD, MBA Clinical Acute Care Pharmacist

## 2021-05-10 ENCOUNTER — Encounter: Payer: Self-pay | Admitting: Dermatology

## 2021-05-13 ENCOUNTER — Other Ambulatory Visit: Payer: Self-pay

## 2021-05-13 MED ORDER — CARESTART COVID-19 HOME TEST VI KIT
PACK | 0 refills | Status: DC
  Filled 2021-05-13: qty 2, 4d supply, fill #0

## 2021-07-11 ENCOUNTER — Other Ambulatory Visit: Payer: Self-pay

## 2021-07-11 MED ORDER — ZOSTER VAC RECOMB ADJUVANTED 50 MCG/0.5ML IM SUSR
0.5000 mL | Freq: Once | INTRAMUSCULAR | 2 refills | Status: AC
  Filled 2021-07-11: qty 0.5, 1d supply, fill #0
  Filled 2021-10-06: qty 0.5, 1d supply, fill #1

## 2021-08-25 ENCOUNTER — Telehealth: Payer: Self-pay | Admitting: Internal Medicine

## 2021-08-25 DIAGNOSIS — R7301 Impaired fasting glucose: Secondary | ICD-10-CM

## 2021-08-25 DIAGNOSIS — R5383 Other fatigue: Secondary | ICD-10-CM

## 2021-08-25 DIAGNOSIS — E785 Hyperlipidemia, unspecified: Secondary | ICD-10-CM

## 2021-08-25 DIAGNOSIS — Z0000 Encounter for general adult medical examination without abnormal findings: Secondary | ICD-10-CM

## 2021-08-25 NOTE — Telephone Encounter (Signed)
Pt requesting lab orders for her physical that is schedule on 09/29/2021 at 10am... No lab orders in system... Pt requesting callback.Marland KitchenMarland Kitchen

## 2021-08-25 NOTE — Telephone Encounter (Signed)
Pt would like to have labs done prior to her appt on 09/29/2021. I have ordered A1c, CMP, Lipid panel, microalbumin, TSH and CBC. Is there anything else that needs to be added?  ?

## 2021-09-05 ENCOUNTER — Other Ambulatory Visit: Payer: Self-pay

## 2021-09-05 MED ORDER — COVID-19 AT HOME ANTIGEN TEST VI KIT
PACK | 0 refills | Status: DC
  Filled 2021-09-05: qty 2, 4d supply, fill #0

## 2021-09-05 MED ORDER — AZITHROMYCIN 250 MG PO TABS
ORAL_TABLET | ORAL | 0 refills | Status: DC
  Filled 2021-09-05: qty 6, 5d supply, fill #0

## 2021-09-05 MED ORDER — BENZONATATE 100 MG PO CAPS
ORAL_CAPSULE | ORAL | 0 refills | Status: DC
  Filled 2021-09-05: qty 30, 10d supply, fill #0

## 2021-09-23 ENCOUNTER — Encounter: Payer: 59 | Admitting: Internal Medicine

## 2021-09-29 ENCOUNTER — Other Ambulatory Visit: Payer: Self-pay

## 2021-09-29 ENCOUNTER — Encounter: Payer: Self-pay | Admitting: Internal Medicine

## 2021-09-29 ENCOUNTER — Ambulatory Visit (INDEPENDENT_AMBULATORY_CARE_PROVIDER_SITE_OTHER): Payer: 59 | Admitting: Internal Medicine

## 2021-09-29 VITALS — BP 118/82 | HR 79 | Temp 98.2°F | Ht 66.0 in | Wt 178.4 lb

## 2021-09-29 DIAGNOSIS — R5383 Other fatigue: Secondary | ICD-10-CM | POA: Diagnosis not present

## 2021-09-29 DIAGNOSIS — G629 Polyneuropathy, unspecified: Secondary | ICD-10-CM | POA: Diagnosis not present

## 2021-09-29 DIAGNOSIS — Z1211 Encounter for screening for malignant neoplasm of colon: Secondary | ICD-10-CM | POA: Diagnosis not present

## 2021-09-29 DIAGNOSIS — E785 Hyperlipidemia, unspecified: Secondary | ICD-10-CM

## 2021-09-29 DIAGNOSIS — Z1231 Encounter for screening mammogram for malignant neoplasm of breast: Secondary | ICD-10-CM

## 2021-09-29 DIAGNOSIS — Z0000 Encounter for general adult medical examination without abnormal findings: Secondary | ICD-10-CM

## 2021-09-29 DIAGNOSIS — E041 Nontoxic single thyroid nodule: Secondary | ICD-10-CM | POA: Diagnosis not present

## 2021-09-29 DIAGNOSIS — K760 Fatty (change of) liver, not elsewhere classified: Secondary | ICD-10-CM

## 2021-09-29 DIAGNOSIS — R7301 Impaired fasting glucose: Secondary | ICD-10-CM | POA: Diagnosis not present

## 2021-09-29 LAB — COMPREHENSIVE METABOLIC PANEL
ALT: 41 U/L — ABNORMAL HIGH (ref 0–35)
AST: 23 U/L (ref 0–37)
Albumin: 4.5 g/dL (ref 3.5–5.2)
Alkaline Phosphatase: 62 U/L (ref 39–117)
BUN: 14 mg/dL (ref 6–23)
CO2: 23 mEq/L (ref 19–32)
Calcium: 10.1 mg/dL (ref 8.4–10.5)
Chloride: 104 mEq/L (ref 96–112)
Creatinine, Ser: 0.75 mg/dL (ref 0.40–1.20)
GFR: 86.43 mL/min (ref >60.00)
Glucose, Bld: 99 mg/dL (ref 70–99)
Potassium: 4.2 mEq/L (ref 3.5–5.1)
Sodium: 138 mEq/L (ref 135–145)
Total Bilirubin: 1 mg/dL (ref 0.2–1.2)
Total Protein: 7.3 g/dL (ref 6.0–8.3)

## 2021-09-29 LAB — CBC WITH DIFFERENTIAL/PLATELET
Basophils Absolute: 0 10*3/uL (ref 0.0–0.1)
Basophils Relative: 0.5 % (ref 0.0–3.0)
Eosinophils Absolute: 0.1 10*3/uL (ref 0.0–0.7)
Eosinophils Relative: 1.1 % (ref 0.0–5.0)
HCT: 44.4 % (ref 36.0–46.0)
Hemoglobin: 15.5 g/dL — ABNORMAL HIGH (ref 12.0–15.0)
Lymphocytes Relative: 33.1 % (ref 12.0–46.0)
Lymphs Abs: 2 10*3/uL (ref 0.7–4.0)
MCHC: 34.9 g/dL (ref 30.0–36.0)
MCV: 89 fl (ref 78.0–100.0)
Monocytes Absolute: 0.4 10*3/uL (ref 0.1–1.0)
Monocytes Relative: 7 % (ref 3.0–12.0)
Neutro Abs: 3.5 10*3/uL (ref 1.4–7.7)
Neutrophils Relative %: 58.3 % (ref 43.0–77.0)
Platelets: 216 10*3/uL (ref 150.0–400.0)
RBC: 4.99 Mil/uL (ref 3.87–5.11)
RDW: 13.2 % (ref 11.5–15.5)
WBC: 6 10*3/uL (ref 4.0–10.5)

## 2021-09-29 LAB — LIPID PANEL
Cholesterol: 223 mg/dL — ABNORMAL HIGH (ref 0–200)
HDL: 56 mg/dL (ref >39.00)
NonHDL: 167.46
Total CHOL/HDL Ratio: 4
Triglycerides: 248 mg/dL — ABNORMAL HIGH (ref 0.0–149.0)
VLDL: 49.6 mg/dL — ABNORMAL HIGH (ref 0.0–40.0)

## 2021-09-29 LAB — MICROALBUMIN / CREATININE URINE RATIO
Creatinine,U: 117.1 mg/dL
Microalb Creat Ratio: 0.7 mg/g (ref 0.0–30.0)
Microalb, Ur: 0.8 mg/dL (ref 0.0–1.9)

## 2021-09-29 LAB — TSH: TSH: 0.89 u[IU]/mL (ref 0.35–5.50)

## 2021-09-29 LAB — B12 AND FOLATE PANEL
Folate: >24.2 ng/mL (ref >5.9)
Vitamin B-12: 412 pg/mL (ref 211–911)

## 2021-09-29 LAB — LDL CHOLESTEROL, DIRECT: Direct LDL: 139 mg/dL

## 2021-09-29 LAB — HEMOGLOBIN A1C: Hgb A1c MFr Bld: 5.4 % (ref 4.6–6.5)

## 2021-09-29 MED ORDER — HEPATITIS A VACCINE 1440 EL U/ML IM SUSP
1.0000 mL | Freq: Once | INTRAMUSCULAR | 0 refills | Status: AC
  Filled 2021-09-29: qty 0.5, fill #0

## 2021-09-29 MED ORDER — SCOPOLAMINE 1 MG/3DAYS TD PT72
1.0000 | MEDICATED_PATCH | TRANSDERMAL | 0 refills | Status: DC
  Filled 2021-09-29: qty 10, 30d supply, fill #0

## 2021-09-29 MED ORDER — DOXYCYCLINE HYCLATE 100 MG PO TABS
100.0000 mg | ORAL_TABLET | Freq: Two times a day (BID) | ORAL | 0 refills | Status: DC
  Filled 2021-09-29: qty 20, 10d supply, fill #0

## 2021-09-29 NOTE — Patient Instructions (Addendum)
You will need to restart the hepatitis A vaccine series   :  2 shots total,  shot #2 is due at 6 months   Referral to [facility] for colonoscopy has been made (due Dec 2023)  Notify me when you have them done (and the 2nd shingrix)   [facility] Spotted Fever  can present with fever and a headache, NOT a rash; s the occurrence of these symptoms during spring/summer/fall in the context of recent tick exposure is RMSF until proven otherwise and should be treated immediately with doxycycline.  I have sent this rx to your pharmacy to use if you develop these symptoms.  Let me know if this occurs so we can set you up for the appropriate testing .

## 2021-09-29 NOTE — Progress Notes (Unsigned)
Patient ID: Pamela Lara, female    DOB: 12-29-1960  Age: 61 y.o. MRN: [id number]  The patient is here for annual preventive examination and management of other chronic and acute problems.   The risk factors are reflected in the social history.  The roster of all physicians providing medical care to patient - is listed in the Snapshot section of the chart.  Activities of daily living:  The patient is 100% independent in all ADLs: dressing, toileting, feeding as well as independent mobility  Home safety : The patient has smoke detectors in the home. They wear seatbelts.  There are no firearms at home. There is no violence in the home.   There is no risks for hepatitis, STDs or HIV. There is no   history of blood transfusion. They have no travel history to infectious disease endemic areas of the world.  The patient has seen their dentist in the last six month. They have seen their eye doctor in the last year. They admit to slight hearing difficulty with regard to whispered voices and some television programs.  They have deferred audiologic testing in the last year.  They do not  have excessive sun exposure. Discussed the need for sun protection: hats, long sleeves and use of sunscreen if there is significant sun exposure.   Diet: the importance of a healthy diet is discussed. They do have a healthy diet.  The benefits of regular aerobic exercise were discussed. She walks 4 times per week ,  20 minutes.   Depression screen: there are no signs or vegative symptoms of depression- irritability, change in appetite, anhedonia, sadness/tearfullness.  Cognitive assessment: the patient manages all their financial and personal affairs and is actively engaged. They could relate day,date,year and events; recalled 2/3 objects at 3 minutes; performed clock-face test normally.  The following portions of the patient's history were reviewed and updated as appropriate: allergies, current medications, past  family history, past medical history,  past surgical history, past social history  and problem list.  Visual acuity was not assessed per patient preference since she has regular follow up with her ophthalmologist. Hearing and body mass index were assessed and reviewed.   During the course of the visit the patient was educated and counseled about appropriate screening and preventive services including : fall prevention , diabetes screening, nutrition counseling, colorectal cancer screening, and recommended immunizations.    CC: The primary encounter diagnosis was Encounter for screening mammogram for malignant neoplasm of breast. Diagnoses of Other fatigue, Impaired fasting glucose, and Hyperlipidemia, unspecified hyperlipidemia type were also pertinent to this visit.  History Pamela Lara has a past medical history of Arthritis, BPPV (benign paroxysmal positional vertigo), Congenital bowed legs, Diverticulitis, colon, Dysplastic nevus (09/09/2018), Dysplastic nevus (07/09/2019), Dysplastic nevus (07/09/2019), Dysplastic nevus (07/09/2019), Ectopic pregnancy (01/29/2000), GERD (gastroesophageal reflux disease), History of colonic diverticulitis, History of ectopic pregnancy, History of Helicobacter pylori infection (2013  approx.), Melanoma ([place]) (04/19/2018), PONV (postoperative nausea and vomiting), and Seasonal affective disorder (Pamela Lara).   She has a past surgical history that includes Colonoscopy with propofol (N/A, 04/19/2017); Cholecystectomy open (1984); Tonsillectomy; Laparoscopy for ectopic pregnancy (2001); Osteotomy (Bilateral, 1980 and 1981); EXCISIONAL SENTINAL LYMPH NODE BIOPSY, NECK (N/A, 2016  approx.); Laparoscopic sigmoid colectomy (N/A, 08/15/2017); and Cystoscopy with stent placement (Bilateral, 08/15/2017).   Her family history includes Arthritis in her father and mother; Cancer in her mother; Cancer (age of onset: 49) in her father; Hypertension in her father and mother.She reports that she  quit smoking  about 4 years ago. Her smoking use included cigarettes. She has a 9.50 pack-year smoking history. She has never used smokeless tobacco. She reports current alcohol use. She reports that she does not use drugs.  Outpatient Medications Prior to Visit  Medication Sig Dispense Refill   calcium carbonate (TUMS - DOSED IN MG ELEMENTAL CALCIUM) 500 MG chewable tablet Chew 1 tablet by mouth as needed for indigestion or heartburn.     Cholecalciferol (VITAMIN D3) 1000 UNITS CAPS Take 1,000 Units by mouth daily.     [place] Ginseng 1000 MG TABS Take 1,000 mg by mouth daily.      Multiple Vitamin (MULTIVITAMIN) tablet Take 1 tablet by mouth daily.     Omega-3 Fatty Acids (FISH OIL) 1000 MG CAPS Take 1,000 mg by mouth daily.      Psyllium (METAMUCIL FIBER PO) Take by mouth.     scopolamine (TRANSDERM-SCOP, 1.5 MG,) 1 MG/3DAYS Place 1 patch (1.5 mg total) onto the skin every 3 (three) days. 4 patch 0   azithromycin (ZITHROMAX) 250 MG tablet 2 po day 1, 1 po days 2-5 with food 6 tablet 0   benzonatate (TESSALON) 100 MG capsule 1 capsule 3 times a day until directed to stop.  Take only as needed.  as needed for cough 30 capsule 0   COVID-19 At Home Antigen Test (CARESTART COVID-19 HOME TEST) KIT use as directed 2 kit 0   COVID-19 At Home Antigen Test (CARESTART COVID-19 HOME TEST) KIT use as directed 2 kit 0   COVID-19 At Home Antigen Test (CARESTART COVID-19 HOME TEST) KIT Use s directed 2 kit 0   COVID-19 mRNA bivalent vaccine, Pfizer, (PFIZER COVID-19 VAC BIVALENT) injection Inject into the muscle. 0.3 mL 0   No facility-administered medications prior to visit.    Review of Systems  Objective:  BP 118/82 (BP Location: Left Arm, Patient Position: Sitting, Cuff Size: Normal)   Pulse 79   Temp 98.2 F (36.8 C) (Oral)   Ht 5' 6" (1.676 m)   Wt 178 lb 6.4 oz (80.9 kg)   LMP 10/03/2015 (Approximate)   SpO2 97%   BMI 28.79 kg/m   Physical Exam  Physical Exam   Assessment & Plan:    Problem List Items Addressed This Visit   None Visit Diagnoses     Encounter for screening mammogram for malignant neoplasm of breast    -  Primary   Other fatigue       Impaired fasting glucose       Hyperlipidemia, unspecified hyperlipidemia type           I have discontinued Pamela Lara's Carestart COVID-19 Home Test, Pfizer COVID-19 Vac Bivalent, Scientific laboratory technician COVID-19 Home Test, azithromycin, benzonatate, and COVID-19 At Home Antigen Test. I am also having her maintain her Vitamin D3, Fish Oil, [place] Ginseng, calcium carbonate, Psyllium (METAMUCIL FIBER PO), multivitamin, and scopolamine.  No orders of the defined types were placed in this encounter.   Medications Discontinued During This Encounter  Medication Reason   azithromycin (ZITHROMAX) 250 MG tablet    benzonatate (TESSALON) 100 MG capsule Completed Course   COVID-19 At Home Antigen Test (CARESTART COVID-19 HOME TEST) KIT    COVID-19 At Home Antigen Test (CARESTART COVID-19 HOME TEST) KIT    COVID-19 At Home Antigen Test (CARESTART COVID-19 HOME TEST) KIT    COVID-19 mRNA bivalent vaccine, Pfizer, (PFIZER COVID-19 VAC BIVALENT) injection     Follow-up: No follow-ups on file.   Crecencio Mc, MD

## 2021-09-30 ENCOUNTER — Other Ambulatory Visit: Payer: Self-pay

## 2021-09-30 ENCOUNTER — Telehealth: Payer: Self-pay

## 2021-09-30 NOTE — Telephone Encounter (Signed)
Wants to be scheduled after January 2024 and I can only schedule till dec patient will call back in a few months to schedule

## 2021-10-01 NOTE — Assessment & Plan Note (Signed)
5 yr follow up screening is Due Dec 2023 . Referral made

## 2021-10-01 NOTE — Assessment & Plan Note (Signed)

## 2021-10-01 NOTE — Assessment & Plan Note (Signed)
Biopsied in 2016,  Non malignant.

## 2021-10-01 NOTE — Assessment & Plan Note (Addendum)
Her ALTelevation has nearly resolved.  counselled to lose weight/    Lab Results  Component Value Date   ALT 41 (H) 09/29/2021   AST 23 09/29/2021   ALKPHOS 62 09/29/2021   BILITOT 1.0 09/29/2021

## 2021-10-06 ENCOUNTER — Other Ambulatory Visit: Payer: Self-pay

## 2021-12-20 IMAGING — MG MM DIGITAL SCREENING BILAT W/ TOMO AND CAD
8 series · 8 of 24 positions shown · non-contrast
Comparison: Previous exam(s).

CLINICAL DATA: Screening.

EXAM:
DIGITAL SCREENING BILATERAL MAMMOGRAM WITH TOMOSYNTHESIS AND CAD
TECHNIQUE: Bilateral screening digital craniocaudal and mediolateral oblique
mammograms were obtained. Bilateral screening digital breast
tomosynthesis was performed. The images were evaluated with
computer-aided detection.

[L MLO synth-2D]
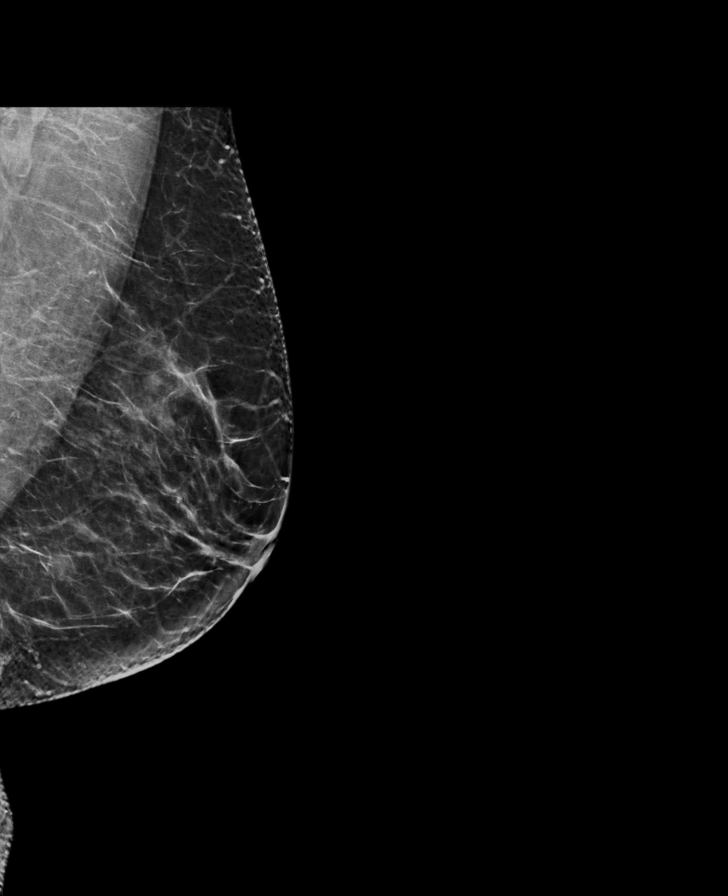

[L CC synth-2D]
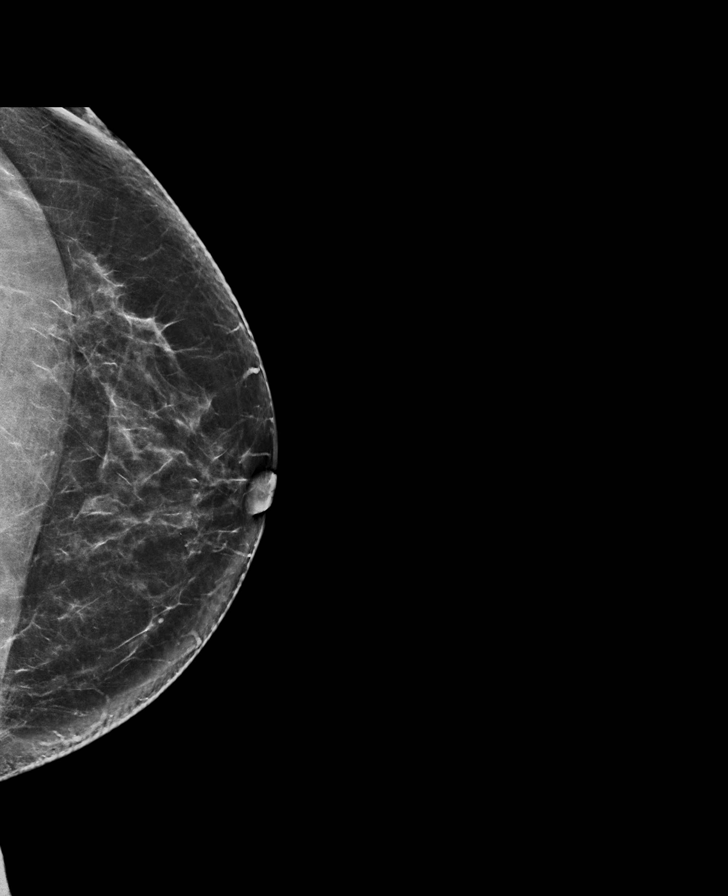

[R CC synth-2D]
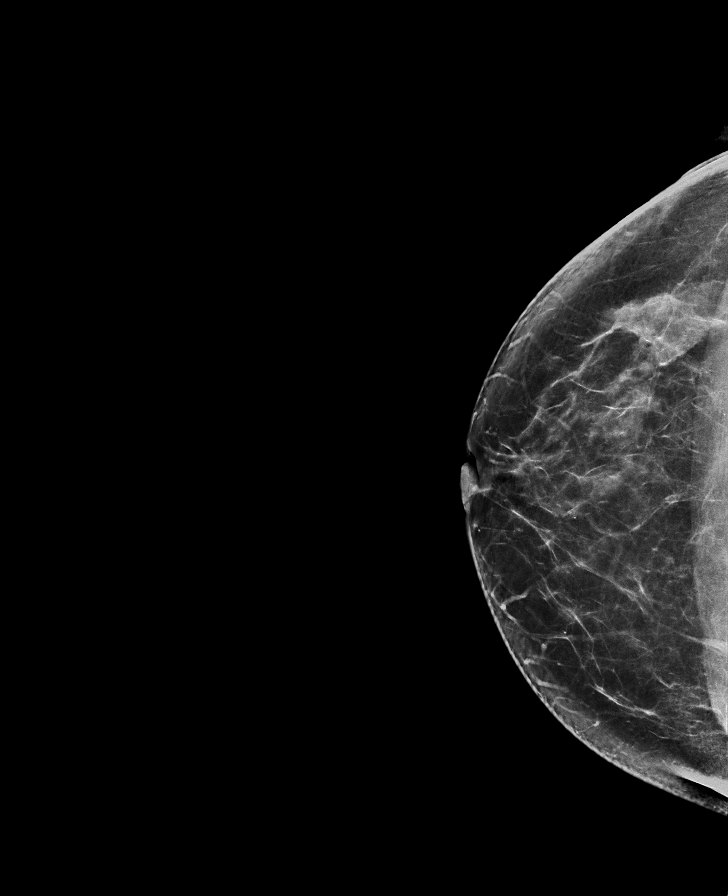

[R MLO synth-2D]
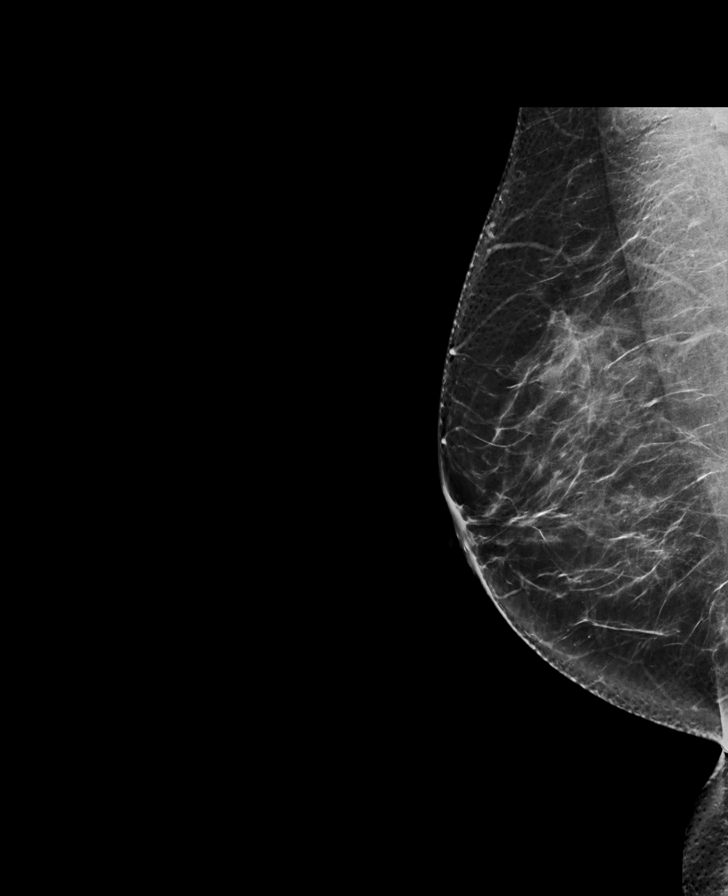

[R MLO tomo · tomo slice 43/84.0]
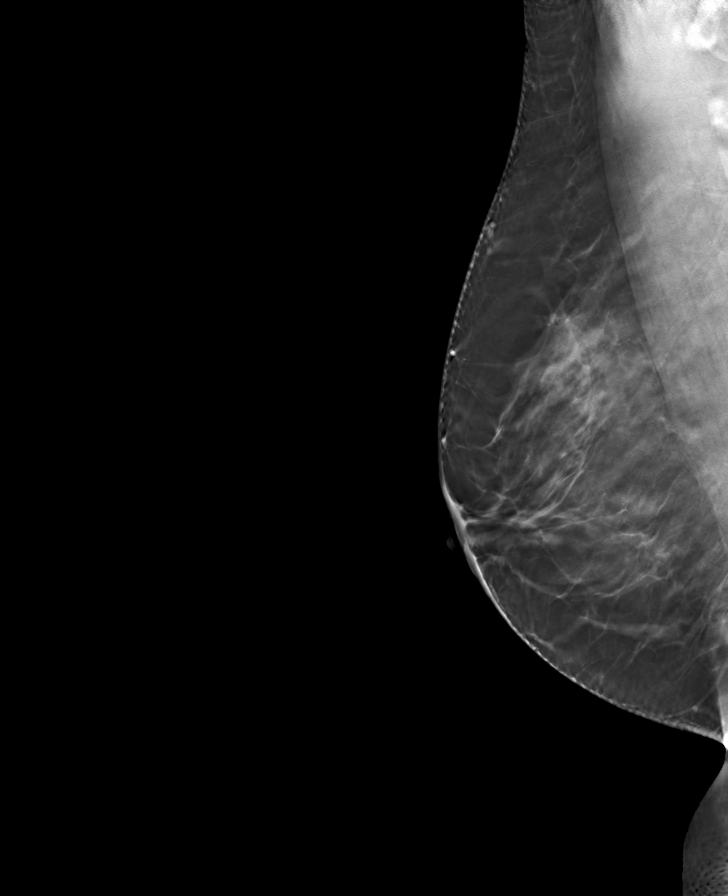

[R CC tomo · tomo slice 42/83.0]
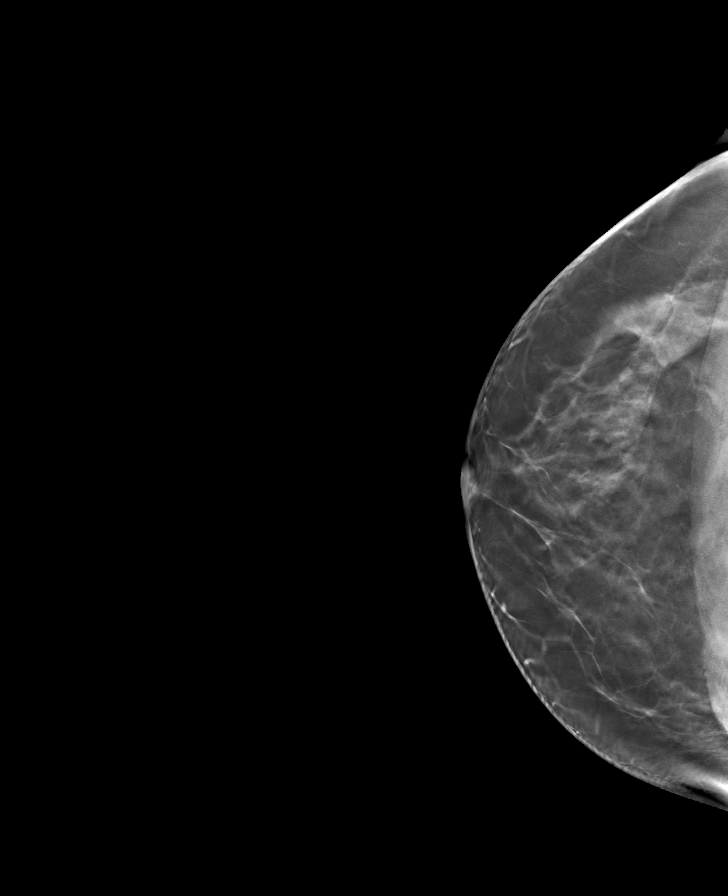

[L MLO tomo · tomo slice 39/78.0]
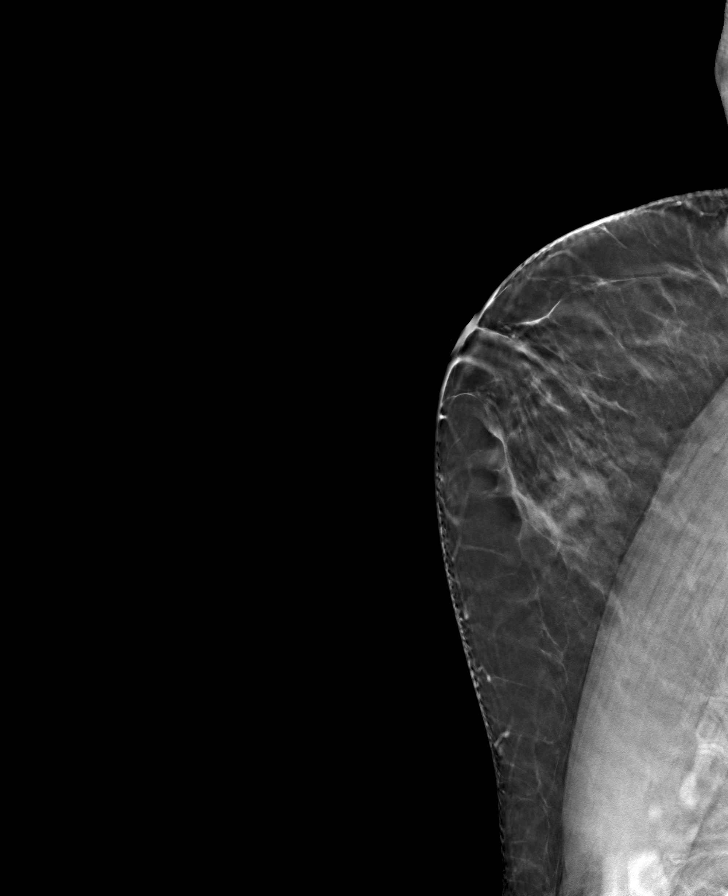

[L CC tomo · tomo slice 41/81.0]
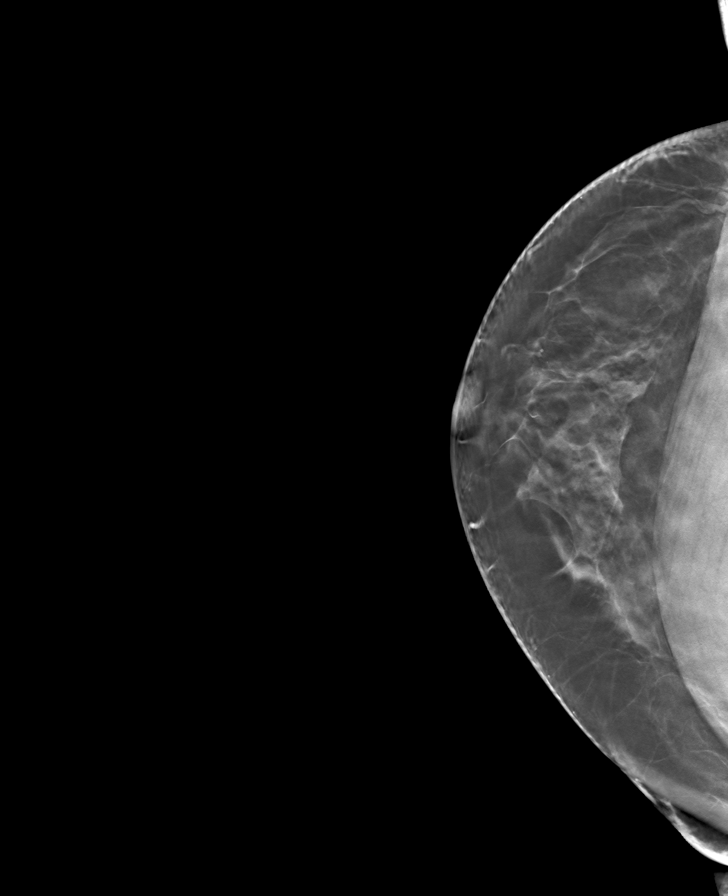

[8 of 24 positions shown; findings below may reference images not displayed]

ACR Breast Density Category b: There are scattered areas of
fibroglandular density.
FINDINGS: There are no findings suspicious for malignancy. The images were
evaluated with computer-aided detection.
IMPRESSION: No mammographic evidence of malignancy. A result letter of this
screening mammogram will be mailed directly to the patient.

RECOMMENDATION:
Screening mammogram in one year. (Code:WJ-I-BG6)

BI-RADS CATEGORY  1: Negative.

## 2022-01-31 ENCOUNTER — Ambulatory Visit
Admission: RE | Admit: 2022-01-31 | Discharge: 2022-01-31 | Disposition: A | Discharge status: Home / Self Care | Payer: 59 | Source: Ambulatory Visit | Attending: Internal Medicine | Admitting: Internal Medicine

## 2022-01-31 DIAGNOSIS — Z1231 Encounter for screening mammogram for malignant neoplasm of breast: Secondary | ICD-10-CM | POA: Diagnosis not present

## 2022-02-16 ENCOUNTER — Other Ambulatory Visit: Payer: Self-pay

## 2022-02-16 MED ORDER — FLUARIX QUADRIVALENT 0.5 ML IM SUSY
PREFILLED_SYRINGE | INTRAMUSCULAR | 0 refills | Status: AC
  Filled 2022-02-17: qty 0.5, 1d supply, fill #0

## 2022-02-17 ENCOUNTER — Other Ambulatory Visit: Payer: Self-pay

## 2022-03-02 DIAGNOSIS — H43811 Vitreous degeneration, right eye: Secondary | ICD-10-CM | POA: Diagnosis not present

## 2022-05-04 ENCOUNTER — Ambulatory Visit: Payer: Commercial Managed Care - PPO | Admitting: Family Medicine

## 2022-05-05 ENCOUNTER — Ambulatory Visit: Payer: Commercial Managed Care - PPO | Admitting: Internal Medicine

## 2022-05-11 ENCOUNTER — Ambulatory Visit: Payer: Commercial Managed Care - PPO | Admitting: Dermatology

## 2022-05-11 VITALS — BP 111/76

## 2022-05-11 DIAGNOSIS — L820 Inflamed seborrheic keratosis: Secondary | ICD-10-CM | POA: Diagnosis not present

## 2022-05-11 DIAGNOSIS — Z86018 Personal history of other benign neoplasm: Secondary | ICD-10-CM | POA: Diagnosis not present

## 2022-05-11 DIAGNOSIS — L821 Other seborrheic keratosis: Secondary | ICD-10-CM | POA: Diagnosis not present

## 2022-05-11 DIAGNOSIS — D229 Melanocytic nevi, unspecified: Secondary | ICD-10-CM | POA: Diagnosis not present

## 2022-05-11 DIAGNOSIS — L814 Other melanin hyperpigmentation: Secondary | ICD-10-CM | POA: Diagnosis not present

## 2022-05-11 DIAGNOSIS — Z1283 Encounter for screening for malignant neoplasm of skin: Secondary | ICD-10-CM

## 2022-05-11 DIAGNOSIS — L578 Other skin changes due to chronic exposure to nonionizing radiation: Secondary | ICD-10-CM | POA: Diagnosis not present

## 2022-05-11 DIAGNOSIS — Z86006 Personal history of melanoma in-situ: Secondary | ICD-10-CM

## 2022-05-11 DIAGNOSIS — Z85820 Personal history of malignant melanoma of skin: Secondary | ICD-10-CM

## 2022-05-11 NOTE — Progress Notes (Signed)
Follow-Up Visit   Subjective  Pamela Lara is a 62 y.o. 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  The patient has spots, moles and lesions to be evaluated, some may be new or changing and the patient has concerns that these could be cancer./).  The following portions of the chart were reviewed this encounter and updated as appropriate:   Tobacco  Allergies  Meds  Problems  Med Hx  Surg Hx  Fam Hx     Review of Systems:  No other skin or systemic complaints except as noted in HPI or Assessment and Plan.  Objective  Well appearing patient in no apparent distress; mood and affect are within normal limits.  A full examination was performed including scalp, head, eyes, ears, nose, lips, neck, chest, axillae, abdomen, back, buttocks, bilateral upper extremities, bilateral lower extremities, hands, feet, fingers, toes, fingernails, and toenails. All findings within normal limits unless otherwise noted below.  Back x 27, chest/neck x 8 (27) Erythematous stuck-on, waxy papule or plaque   Assessment & Plan  Inflamed seborrheic keratosis (27) Back x 27, chest/neck x 8  Destruction of lesion - Back x 27, chest/neck x 8 Complexity: simple   Destruction method: cryotherapy   Informed consent: discussed and consent obtained   Timeout:  patient name, date of birth, surgical site, and procedure verified Lesion destroyed using liquid nitrogen: Yes   Region frozen until ice ball extended beyond lesion: Yes   Outcome: patient tolerated procedure well with no complications   Post-procedure details: wound care instructions given    History of Melanoma in Situ - No evidence of recurrence today - Recommend regular full body skin exams - Recommend daily broad spectrum sunscreen SPF 30+ to sun-exposed areas, reapply every 2  hours as needed.  - Call if any new or changing lesions are noted between office visits  - No lymphadenopathy.  History of Dysplastic Nevi - No evidence of recurrence today - Recommend regular full body skin exams - Recommend daily broad spectrum sunscreen SPF 30+ to sun-exposed areas, reapply every 2 hours as needed.  - Call if any new or changing lesions are noted between office visits  Lentigines - Scattered tan macules - Due to sun exposure - Benign-appearing, observe - Recommend daily broad spectrum sunscreen SPF 30+ to sun-exposed areas, reapply every 2 hours as needed. - Call for any changes  Seborrheic Keratoses - Stuck-on, waxy, tan-brown papules and/or plaques  - Benign-appearing - Discussed benign etiology and prognosis. - Observe - Call for any changes  Melanocytic Nevi - Tan-brown and/or pink-flesh-colored symmetric macules and papules - Benign appearing on exam today - Observation - Call clinic for new or changing moles - Recommend daily use of broad spectrum spf 30+ sunscreen to sun-exposed areas.   Hemangiomas - Red papules - Discussed benign nature - Observe - Call for any changes  Actinic Damage - Chronic condition, secondary to cumulative UV/sun exposure - diffuse scaly erythematous macules with underlying dyspigmentation - Recommend daily broad spectrum sunscreen SPF 30+ to sun-exposed areas, reapply every 2 hours as needed.  - Staying in the shade or wearing long sleeves, sun glasses (UVA+UVB protection) and wide brim hats (4-inch brim around the entire circumference of the hat) are also recommended for sun protection.  - Call for new or changing lesions.  Skin cancer screening performed  today.  Return in about 1 year (around 05/12/2023) for TBSE.  I, Ashok Cordia, CMA, am acting as scribe for Sarina Ser, MD . Documentation: I have reviewed the above documentation for accuracy and completeness, and I agree with the above.  Sarina Ser,  MD

## 2022-05-11 NOTE — Patient Instructions (Addendum)
Recommend Cerave cream or lotion as moisturizer    Cryotherapy Aftercare  Wash gently with soap and water everyday.   Apply Vaseline and Band-Aid daily until healed.    Due to recent changes in healthcare laws, you may see results of your pathology and/or laboratory studies on MyChart before the doctors have had a chance to review them. We understand that in some cases there may be results that are confusing or concerning to you. Please understand that not all results are received at the same time and often the doctors may need to interpret multiple results in order to provide you with the best plan of care or course of treatment. Therefore, we ask that you please give [place] 2 business days to thoroughly review all your results before contacting the office for clarification. Should we see a critical lab result, you will be contacted sooner.   If You Need Anything After Your Visit  If you have any questions or concerns for your doctor, please call our main line at [phone] and press option 4 to reach your doctor's medical assistant. If no one answers, please leave a voicemail as directed and we will return your call as soon as possible. Messages left after 4 pm will be answered the following business day.   You may also send [place] a message via [place]. We typically respond to MyChart messages within 1-2 business days.  For prescription refills, please ask your pharmacy to contact our office. Our fax number is [phone].  If you have an urgent issue when the clinic is closed that cannot wait until the next business day, you can page your doctor at the number below.    Please note that while we do our best to be available for urgent issues outside of office hours, we are not available 24/7.   If you have an urgent issue and are unable to reach [place], you may choose to seek medical care at your doctor's office, retail clinic, urgent care center, or emergency room.  If you have a medical  emergency, please immediately call 911 or go to the emergency department.  Pager Numbers  - Dr. Nehemiah Massed: [phone]  - Dr. Laurence Ferrari: [phone]  - Dr. Nicole Kindred: [phone]  In the event of inclement weather, please call our main line at [phone] for an update on the status of any delays or closures.  Dermatology Medication Tips: Please keep the boxes that topical medications come in in order to help keep track of the instructions about where and how to use these. Pharmacies typically print the medication instructions only on the boxes and not directly on the medication tubes.   If your medication is too expensive, please contact our office at [phone] option 4 or send [place] a message through [place].   We are unable to tell what your co-pay for medications will be in advance as this is different depending on your insurance coverage. However, we may be able to find a substitute medication at lower cost or fill out paperwork to get insurance to cover a needed medication.   If a prior authorization is required to get your medication covered by your insurance company, please allow [place] 1-2 business days to complete this process.  Drug prices often vary depending on where the prescription is filled and some pharmacies may offer cheaper prices.  The website www.goodrx.com contains coupons for medications through different pharmacies. The prices here do not account for what the cost may be with help from insurance (it may  be cheaper with your insurance), but the website can give you the price if you did not use any insurance.  - You can print the associated coupon and take it with your prescription to the pharmacy.  - You may also stop by our office during regular business hours and pick up a GoodRx coupon card.  - If you need your prescription sent electronically to a different pharmacy, notify our office through [facility] or by phone at [phone] option 4.     Si Usted  Necesita Algo Despus de Su Visita  Tambin puede enviarnos un mensaje a travs de Pharmacist, community. Por lo general respondemos a los mensajes de MyChart en el transcurso de 1 a 2 das hbiles.  Para renovar recetas, por favor pida a su farmacia que se ponga en contacto con nuestra oficina. Harland Dingwall de fax es [place] [phone].  Si tiene un asunto urgente cuando la clnica est cerrada y que no puede esperar hasta el siguiente da hbil, puede llamar/localizar a su doctor(a) al nmero que aparece a continuacin.   Por favor, tenga en cuenta que aunque hacemos todo lo posible para estar disponibles para asuntos urgentes fuera del horario de [place], no estamos disponibles las 24 horas del da, los 7 das de la [place].   Si tiene un problema urgente y no puede comunicarse con nosotros, puede optar por buscar atencin mdica  en el consultorio de su doctor(a), en una clnica privada, en un centro de atencin urgente o en una sala de emergencias.  Si tiene Engineering geologist, por favor llame inmediatamente al 911 o vaya a la sala de emergencias.  Nmeros de bper  - Dr. Nehemiah Massed: [phone]  - Dra. Moye: [phone]  - Dra. Nicole Kindred: [phone]  En caso de inclemencias del [place], por favor llame a Johnsie Kindred principal al [phone] para una actualizacin sobre el [place] de cualquier retraso o cierre.  Consejos para la medicacin en dermatologa: Por favor, guarde las cajas en las que vienen los medicamentos de uso tpico para ayudarle a seguir las instrucciones sobre dnde y cmo usarlos. Las farmacias generalmente imprimen las instrucciones del medicamento slo en las cajas y no directamente en los tubos del [place].   Si su medicamento es muy caro, por favor, pngase en contacto con Zigmund Daniel llamando al [phone] y presione la opcin 4 o envenos un mensaje a travs de Pharmacist, community.   No podemos decirle cul ser su copago por los medicamentos por adelantado ya que esto es  diferente dependiendo de la cobertura de su seguro. Sin embargo, es posible que podamos encontrar un medicamento sustituto a Electrical engineer un formulario para que el seguro cubra el medicamento que se considera necesario.   Si se requiere una autorizacin previa para que su compaa de seguros [place] su medicamento, por favor permtanos de 1 a 2 das hbiles para completar este proceso.  Los precios de los medicamentos varan con frecuencia dependiendo del Environmental consultant de dnde se surte la receta y alguna farmacias pueden ofrecer precios ms baratos.  El sitio web www.goodrx.com tiene cupones para medicamentos de Airline pilot. Los precios aqu no tienen en cuenta lo que podra costar con la ayuda del seguro (puede ser ms barato con su seguro), pero el sitio web puede darle el precio si no utiliz Research scientist (physical sciences).  - Puede imprimir el cupn correspondiente y llevarlo con su receta a la farmacia.  - Tambin puede pasar por nuestra oficina durante el horario de atencin regular y  recoger una tarjeta de cupones de GoodRx.  - Si necesita que su receta se enve electrnicamente a una farmacia diferente, informe a nuestra oficina a travs de MyChart de [place] o por telfono llamando al [phone] y presione la opcin 4.

## 2022-05-15 ENCOUNTER — Encounter: Payer: Self-pay | Admitting: Dermatology

## 2022-05-30 DIAGNOSIS — H43811 Vitreous degeneration, right eye: Secondary | ICD-10-CM | POA: Diagnosis not present

## 2022-05-30 DIAGNOSIS — H5203 Hypermetropia, bilateral: Secondary | ICD-10-CM | POA: Diagnosis not present

## 2022-10-05 ENCOUNTER — Ambulatory Visit (INDEPENDENT_AMBULATORY_CARE_PROVIDER_SITE_OTHER): Payer: Commercial Managed Care - PPO | Admitting: Internal Medicine

## 2022-10-05 ENCOUNTER — Encounter: Payer: Self-pay | Admitting: Internal Medicine

## 2022-10-05 VITALS — BP 120/72 | HR 65 | Temp 97.9°F | Ht 66.0 in | Wt 180.6 lb

## 2022-10-05 DIAGNOSIS — R7301 Impaired fasting glucose: Secondary | ICD-10-CM | POA: Diagnosis not present

## 2022-10-05 DIAGNOSIS — Z0000 Encounter for general adult medical examination without abnormal findings: Secondary | ICD-10-CM

## 2022-10-05 DIAGNOSIS — K760 Fatty (change of) liver, not elsewhere classified: Secondary | ICD-10-CM

## 2022-10-05 DIAGNOSIS — E781 Pure hyperglyceridemia: Secondary | ICD-10-CM | POA: Diagnosis not present

## 2022-10-05 DIAGNOSIS — Z1211 Encounter for screening for malignant neoplasm of colon: Secondary | ICD-10-CM | POA: Diagnosis not present

## 2022-10-05 DIAGNOSIS — D126 Benign neoplasm of colon, unspecified: Secondary | ICD-10-CM

## 2022-10-05 DIAGNOSIS — N393 Stress incontinence (female) (male): Secondary | ICD-10-CM | POA: Diagnosis not present

## 2022-10-05 DIAGNOSIS — E041 Nontoxic single thyroid nodule: Secondary | ICD-10-CM

## 2022-10-05 DIAGNOSIS — E559 Vitamin D deficiency, unspecified: Secondary | ICD-10-CM | POA: Diagnosis not present

## 2022-10-05 DIAGNOSIS — R5383 Other fatigue: Secondary | ICD-10-CM

## 2022-10-05 DIAGNOSIS — Z1231 Encounter for screening mammogram for malignant neoplasm of breast: Secondary | ICD-10-CM

## 2022-10-05 LAB — CBC WITH DIFFERENTIAL/PLATELET
Basophils Absolute: 0 10*3/uL (ref 0.0–0.1)
Basophils Relative: 0.5 % (ref 0.0–3.0)
Eosinophils Absolute: 0.1 10*3/uL (ref 0.0–0.7)
Eosinophils Relative: 1.2 % (ref 0.0–5.0)
HCT: 44.1 % (ref 36.0–46.0)
Hemoglobin: 15.3 g/dL — ABNORMAL HIGH (ref 12.0–15.0)
Lymphocytes Relative: 33 % (ref 12.0–46.0)
Lymphs Abs: 1.8 10*3/uL (ref 0.7–4.0)
MCHC: 34.8 g/dL (ref 30.0–36.0)
MCV: 89.2 fl (ref 78.0–100.0)
Monocytes Absolute: 0.4 10*3/uL (ref 0.1–1.0)
Monocytes Relative: 7.7 % (ref 3.0–12.0)
Neutro Abs: 3.1 10*3/uL (ref 1.4–7.7)
Neutrophils Relative %: 57.6 % (ref 43.0–77.0)
Platelets: 207 10*3/uL (ref 150.0–400.0)
RBC: 4.94 Mil/uL (ref 3.87–5.11)
RDW: 12.6 % (ref 11.5–15.5)
WBC: 5.4 10*3/uL (ref 4.0–10.5)

## 2022-10-05 LAB — COMPREHENSIVE METABOLIC PANEL
ALT: 43 U/L — ABNORMAL HIGH (ref 0–35)
AST: 25 U/L (ref 0–37)
Albumin: 4.4 g/dL (ref 3.5–5.2)
Alkaline Phosphatase: 59 U/L (ref 39–117)
BUN: 10 mg/dL (ref 6–23)
CO2: 24 mEq/L (ref 19–32)
Calcium: 9.3 mg/dL (ref 8.4–10.5)
Chloride: 104 mEq/L (ref 96–112)
Creatinine, Ser: 0.74 mg/dL (ref 0.40–1.20)
GFR: 87.21 mL/min (ref >60.00)
Glucose, Bld: 103 mg/dL — ABNORMAL HIGH (ref 70–99)
Potassium: 4 mEq/L (ref 3.5–5.1)
Sodium: 138 mEq/L (ref 135–145)
Total Bilirubin: 0.8 mg/dL (ref 0.2–1.2)
Total Protein: 7.5 g/dL (ref 6.0–8.3)

## 2022-10-05 LAB — URINALYSIS, ROUTINE W REFLEX MICROSCOPIC
Bilirubin Urine: NEGATIVE
Hgb urine dipstick: NEGATIVE
Ketones, ur: NEGATIVE
Leukocytes,Ua: NEGATIVE
Nitrite: NEGATIVE
Specific Gravity, Urine: 1.015 (ref 1.000–1.030)
Total Protein, Urine: NEGATIVE
Urine Glucose: NEGATIVE
Urobilinogen, UA: 0.2 (ref 0.0–1.0)
pH: 6.5 (ref 5.0–8.0)

## 2022-10-05 LAB — LIPID PANEL
Cholesterol: 195 mg/dL (ref 0–200)
HDL: 54.8 mg/dL (ref >39.00)
LDL Cholesterol: 105 mg/dL — ABNORMAL HIGH (ref 0–99)
NonHDL: 140.6
Total CHOL/HDL Ratio: 4
Triglycerides: 178 mg/dL — ABNORMAL HIGH (ref 0.0–149.0)
VLDL: 35.6 mg/dL (ref 0.0–40.0)

## 2022-10-05 LAB — TSH: TSH: 1.18 u[IU]/mL (ref 0.35–5.50)

## 2022-10-05 LAB — B12 AND FOLATE PANEL
Folate: >23.9 ng/mL (ref >5.9)
Vitamin B-12: 408 pg/mL (ref 211–911)

## 2022-10-05 LAB — HEMOGLOBIN A1C: Hgb A1c MFr Bld: 5.4 % (ref 4.6–6.5)

## 2022-10-05 LAB — VITAMIN D 25 HYDROXY (VIT D DEFICIENCY, FRACTURES): VITD: 45.06 ng/mL (ref 30.00–100.00)

## 2022-10-05 LAB — LDL CHOLESTEROL, DIRECT: Direct LDL: 136 mg/dL

## 2022-10-05 NOTE — Assessment & Plan Note (Signed)
Removed in 2018 Servando Snare).  5 yr follow up due now.  Referral made.

## 2022-10-05 NOTE — Progress Notes (Signed)
Patient ID: Pamela Lara, female    DOB: April 02, 1961  Age: 62 y.o. MRN: [id number]  The patient is here for annual preventive examination and management of other chronic and acute problems.   The risk factors are reflected in the social history.   The roster of all physicians providing medical care to patient - is listed in the Snapshot section of the chart.   Activities of daily living:  The patient is 100% independent in all ADLs: dressing, toileting, feeding as well as independent mobility   Home safety : The patient has smoke detectors in the home. They wear seatbelts.  There are no unsecured firearms at home. There is no violence in the home.    There is no risks for hepatitis, STDs or HIV. There is no   history of blood transfusion. They have no travel history to infectious disease endemic areas of the world.   The patient has seen their dentist in the last six month. They have seen their eye doctor in the last year. The patinet  denies slight hearing difficulty with regard to whispered voices and some television programs.  They have deferred audiologic testing in the last year.  They do not  have excessive sun exposure. Discussed the need for sun protection: hats, long sleeves and use of sunscreen if there is significant sun exposure.    Diet: the importance of a healthy diet is discussed. They do have a healthy diet.   The benefits of regular aerobic exercise were discussed. The patient  exercises  3 to 5 days per week  for  60 minutes.    Depression screen: there are no signs or vegative symptoms of depression- irritability, change in appetite, anhedonia, sadness/tearfullness.   The following portions of the patient's history were reviewed and updated as appropriate: allergies, current medications, past family history, past medical history,  past surgical history, past social history  and problem list.   Visual acuity was not assessed per patient preference since the patient has  regular follow up with an  ophthalmologist. Hearing and body mass index were assessed and reviewed.    During the course of the visit the patient was educated and counseled about appropriate screening and preventive services including : fall prevention , diabetes screening, nutrition counseling, colorectal cancer screening, and recommended immunizations.    Chief Complaint:    1) Fatigue for the last 6 months .  Started  this winter .  No motivation  due to fatigue  denies depression.  Works  3  12-13 hour shifts per week at [employer] in the [employer]. 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  Feels better on the weekends and on vacation   2) Stress incontinence   wearing a pad   +  Review of Symptoms  Patient denies headache, fevers, malaise, unintentional weight loss, skin rash, eye pain, sinus congestion and sinus pain, sore throat, dysphagia,  hemoptysis , cough, dyspnea, wheezing, chest pain, palpitations, orthopnea, edema, abdominal pain, nausea, melena, diarrhea, constipation, flank pain, dysuria, hematuria, urinary  Frequency, nocturia, numbness, tingling, seizures,  Focal weakness, Loss of consciousness,  Tremor, insomnia, depression, anxiety, and suicidal ideation.    Physical Exam:  BP 120/72   Pulse 65   Temp 97.9 F (36.6 C)   Ht 5\' 6"  (1.676 m)   Wt 180 lb 9.6 oz (81.9 kg)   LMP 10/03/2015 (Approximate)   SpO2 98%   BMI 29.15 kg/m  Physical Exam Vitals reviewed.  Constitutional:      General: She is not in acute distress.    Appearance: Normal appearance. She is well-developed and normal weight. She is not ill-appearing, toxic-appearing or diaphoretic.  HENT:     Head: Normocephalic.     Right Ear: Tympanic membrane, ear canal and external ear normal. There is no impacted cerumen.     Left Ear: Tympanic membrane, ear canal and external ear normal. There is no impacted cerumen.     Nose: Nose normal.     Mouth/Throat:     Mouth: Mucous  membranes are moist.     Pharynx: Oropharynx is clear.  Eyes:     General: No scleral icterus.       Right eye: No discharge.        Left eye: No discharge.     Conjunctiva/sclera: Conjunctivae normal.     Pupils: Pupils are equal, round, and reactive to light.  Neck:     Thyroid: No thyromegaly.     Vascular: No carotid bruit or JVD.  Cardiovascular:     Rate and Rhythm: Normal rate and regular rhythm.     Heart sounds: Normal heart sounds.  Pulmonary:     Effort: Pulmonary effort is normal. No respiratory distress.     Breath sounds: Normal breath sounds.  Chest:  Breasts:    Breasts are symmetrical.     Right: Normal. No swelling, inverted nipple, mass, nipple discharge, skin change or tenderness.     Left: Normal. No swelling, inverted nipple, mass, nipple discharge, skin change or tenderness.  Abdominal:     General: Bowel sounds are normal.     Palpations: Abdomen is soft. There is no mass.     Tenderness: There is no abdominal tenderness. There is no guarding or rebound.  Musculoskeletal:        General: Normal range of motion.     Cervical back: Normal range of motion and neck supple.  Lymphadenopathy:     Cervical: No cervical adenopathy.     Upper Body:     Right upper body: No supraclavicular, axillary or pectoral adenopathy.     Left upper body: No supraclavicular, axillary or pectoral adenopathy.  Skin:    General: Skin is warm and dry.  Neurological:     General: No focal deficit present.     Mental Status: She is alert and oriented to person, place, and time. Mental status is at baseline.  Psychiatric:        Mood and Affect: Mood normal.        Behavior: Behavior normal.        Thought Content: Thought content normal.        Judgment: Judgment normal.   Assessment and Plan: Encounter for screening mammogram for malignant neoplasm of breast -     3D Screening Mammogram, Left and Right; Future  Encounter for preventive health  examination  Hypertriglyceridemia without hypercholesterolemia Assessment & Plan: Improved.  10 yr risk is low.   Lab Results  Component Value Date   CHOL 195 10/05/2022   HDL 54.80 10/05/2022   LDLCALC 105 (H) 10/05/2022   LDLDIRECT 136.0 10/05/2022   TRIG 178.0 (H) 10/05/2022   CHOLHDL 4 10/05/2022     Orders: -     Lipid panel -     LDL cholesterol, direct  Other fatigue -     TSH -     CBC with Differential/Platelet -     B12  and Folate Panel  Impaired fasting glucose -     Comprehensive metabolic panel -     Hemoglobin A1c  Colon cancer screening -     Ambulatory referral to Gastroenterology  Tubular adenoma of colon Assessment & Plan: Removed in 2018 Servando Snare).  5 yr follow up due now.  Referral made.    Orders: -     Ambulatory referral to Gastroenterology  Stress incontinence -     Urinalysis, Routine w reflex microscopic -     Urine Culture  Vitamin D deficiency -     VITAMIN D 25 Hydroxy (Vit-D Deficiency, Fractures)  Hepatic steatosis Assessment & Plan: Her ALT elevation has persisted,  ultrasound ordered,  counselled to lose weight/    Lab Results  Component Value Date   ALT 43 (H) 10/05/2022   AST 25 10/05/2022   ALKPHOS 59 10/05/2022   BILITOT 0.8 10/05/2022     Orders: -     US ABDOMEN LIMITED RUQ (LIVER/GB); Future  Left thyroid nodule Assessment & Plan: Biopsied in 2016,  Non malignant.       Return in about 1 year (around 10/05/2023) for physical.  Sherlene Shams, MD

## 2022-10-05 NOTE — Patient Instructions (Addendum)
You can't do cologuard as your screening until you are on the "return in 10 years" schedule  You are due now because Pamela Lara removed a TA in 2018  Your annual mammogram has been ordered .  [facility] will not allow [place] to schedule it for you,  so please  call to make your appointment [phone]

## 2022-10-06 LAB — URINE CULTURE
MICRO NUMBER:: 15050067
SPECIMEN QUALITY:: ADEQUATE

## 2022-10-06 NOTE — Assessment & Plan Note (Signed)
Improved.  10 yr risk is low.   Lab Results  Component Value Date   CHOL 195 10/05/2022   HDL 54.80 10/05/2022   LDLCALC 105 (H) 10/05/2022   LDLDIRECT 136.0 10/05/2022   TRIG 178.0 (H) 10/05/2022   CHOLHDL 4 10/05/2022

## 2022-10-06 NOTE — Assessment & Plan Note (Signed)
Her ALT elevation has persisted,  ultrasound ordered,  counselled to lose weight/    Lab Results  Component Value Date   ALT 43 (H) 10/05/2022   AST 25 10/05/2022   ALKPHOS 59 10/05/2022   BILITOT 0.8 10/05/2022

## 2022-10-06 NOTE — Assessment & Plan Note (Signed)
Biopsied in 2016,  Non malignant.   

## 2022-10-12 ENCOUNTER — Telehealth: Payer: Self-pay

## 2022-10-12 ENCOUNTER — Ambulatory Visit
Admission: RE | Admit: 2022-10-12 | Discharge: 2022-10-12 | Disposition: A | Discharge status: Home / Self Care | Payer: Commercial Managed Care - PPO | Source: Ambulatory Visit | Attending: Internal Medicine | Admitting: Internal Medicine

## 2022-10-12 ENCOUNTER — Other Ambulatory Visit: Payer: Self-pay

## 2022-10-12 DIAGNOSIS — K760 Fatty (change of) liver, not elsewhere classified: Secondary | ICD-10-CM | POA: Diagnosis not present

## 2022-10-12 DIAGNOSIS — R945 Abnormal results of liver function studies: Secondary | ICD-10-CM | POA: Diagnosis not present

## 2022-10-12 DIAGNOSIS — Z9049 Acquired absence of other specified parts of digestive tract: Secondary | ICD-10-CM | POA: Diagnosis not present

## 2022-10-12 DIAGNOSIS — Z86010 Personal history of colonic polyps: Secondary | ICD-10-CM

## 2022-10-12 MED ORDER — NA SULFATE-K SULFATE-MG SULF 17.5-3.13-1.6 GM/177ML PO SOLN
1.0000 | Freq: Once | ORAL | 0 refills | Status: AC
  Filled 2022-10-12 – 2023-02-07 (×2): qty 354, 1d supply, fill #0

## 2022-10-12 NOTE — Telephone Encounter (Signed)
Gastroenterology Pre-Procedure Review  Request Date: 02/16/23 Requesting Physician: Dr. Servando Snare  PATIENT REVIEW QUESTIONS: The patient responded to the following health history questions as indicated:    1. Are you having any GI issues? no 2. Do you have a personal history of Polyps? yes (last colonoscopy performed by Dr. Servando Snare 04/19/17 polyps were noted) 3. Do you have a family history of Colon Cancer or Polyps? no 4. Diabetes Mellitus? no 5. Joint replacements in the past 12 months?no 6. Major health problems in the past 3 months?no 7. Any artificial heart valves, MVP, or defibrillator?no    MEDICATIONS & ALLERGIES:    Patient reports the following regarding taking any anticoagulation/antiplatelet therapy:   Plavix, Coumadin, Eliquis, Xarelto, Lovenox, Pradaxa, Brilinta, or Effient? no Aspirin? no  Patient confirms/reports the following medications:  Current Outpatient Medications  Medication Sig Dispense Refill   calcium carbonate (TUMS - DOSED IN MG ELEMENTAL CALCIUM) 500 MG chewable tablet Chew 1 tablet by mouth as needed for indigestion or heartburn.     Cholecalciferol (VITAMIN D3) 1000 UNITS CAPS Take 1,000 Units by mouth daily.     influenza vac split quadrivalent PF (FLUARIX QUADRIVALENT) 0.5 ML injection Inject into the muscle. 0.5 mL 0   [place] Ginseng 1000 MG TABS Take 1,000 mg by mouth daily.      Multiple Vitamin (MULTIVITAMIN) tablet Take 1 tablet by mouth daily.     Omega-3 Fatty Acids (FISH OIL) 1000 MG CAPS Take 1,000 mg by mouth daily.      Psyllium (METAMUCIL FIBER PO) Take by mouth.     No current facility-administered medications for this visit.    Patient confirms/reports the following allergies:  Allergies  Allergen Reactions   Ivp Dye [Iodinated Contrast Media] Shortness Of Breath   Morphine And Codeine Nausea And Vomiting   Other     General anesthesia makes her nauseated with vomiting     Wellbutrin [Bupropion] Other (See Comments)    Joint pain     No orders of the defined types were placed in this encounter.   AUTHORIZATION INFORMATION Primary Insurance: 1D#: Group #:  Secondary Insurance: 1D#: Group #:  SCHEDULE INFORMATION: Date: 02/16/23 Time: Location: [facility]

## 2022-10-27 ENCOUNTER — Other Ambulatory Visit: Payer: Self-pay

## 2022-11-03 ENCOUNTER — Other Ambulatory Visit: Payer: Self-pay

## 2023-02-07 ENCOUNTER — Other Ambulatory Visit: Payer: Self-pay

## 2023-02-16 ENCOUNTER — Ambulatory Visit: Payer: Commercial Managed Care - PPO | Admitting: Anesthesiology

## 2023-02-16 ENCOUNTER — Ambulatory Visit
Admission: RE | Admit: 2023-02-16 | Discharge: 2023-02-16 | Disposition: A | Discharge status: Home / Self Care | Payer: Commercial Managed Care - PPO | Attending: Gastroenterology | Admitting: Gastroenterology

## 2023-02-16 ENCOUNTER — Encounter
Admission: RE | Disposition: A | Discharge status: Home / Self Care | Payer: Self-pay | Source: Home / Self Care | Attending: Gastroenterology

## 2023-02-16 ENCOUNTER — Other Ambulatory Visit: Payer: Self-pay

## 2023-02-16 ENCOUNTER — Encounter: Payer: Self-pay | Admitting: Gastroenterology

## 2023-02-16 DIAGNOSIS — Z860100 Personal history of colon polyps, unspecified: Secondary | ICD-10-CM | POA: Diagnosis not present

## 2023-02-16 DIAGNOSIS — Z1211 Encounter for screening for malignant neoplasm of colon: Secondary | ICD-10-CM | POA: Insufficient documentation

## 2023-02-16 DIAGNOSIS — D125 Benign neoplasm of sigmoid colon: Secondary | ICD-10-CM | POA: Insufficient documentation

## 2023-02-16 DIAGNOSIS — Z860101 Personal history of adenomatous and serrated colon polyps: Secondary | ICD-10-CM | POA: Insufficient documentation

## 2023-02-16 DIAGNOSIS — K640 First degree hemorrhoids: Secondary | ICD-10-CM | POA: Insufficient documentation

## 2023-02-16 DIAGNOSIS — Z09 Encounter for follow-up examination after completed treatment for conditions other than malignant neoplasm: Secondary | ICD-10-CM | POA: Diagnosis not present

## 2023-02-16 DIAGNOSIS — K635 Polyp of colon: Secondary | ICD-10-CM | POA: Diagnosis not present

## 2023-02-16 HISTORY — PX: POLYPECTOMY: SHX5525

## 2023-02-16 HISTORY — PX: COLONOSCOPY WITH PROPOFOL: SHX5780

## 2023-02-16 SURGERY — COLONOSCOPY WITH PROPOFOL
Anesthesia: General

## 2023-02-16 MED ORDER — PROPOFOL 10 MG/ML IV BOLUS
INTRAVENOUS | Status: DC | PRN
  Administered 2023-02-16 (×3): 30 mg via INTRAVENOUS
  Administered 2023-02-16: 80 mg via INTRAVENOUS
  Administered 2023-02-16 (×3): 30 mg via INTRAVENOUS

## 2023-02-16 MED ORDER — STERILE WATER FOR IRRIGATION IR SOLN
Status: DC | PRN
  Administered 2023-02-16: 60 mL

## 2023-02-16 MED ORDER — ONDANSETRON HCL 4 MG/2ML IJ SOLN
INTRAMUSCULAR | Status: DC | PRN
  Administered 2023-02-16: 4 mg via INTRAVENOUS

## 2023-02-16 MED ORDER — SODIUM CHLORIDE 0.9 % IV SOLN: INTRAVENOUS | Status: DC

## 2023-02-16 MED ORDER — LACTATED RINGERS IV SOLN: INTRAVENOUS | Status: DC

## 2023-02-16 SURGICAL SUPPLY — 8 items
GOWN CVR UNV OPN BCK APRN NK (MISCELLANEOUS) ×2 IMPLANT
GOWN ISOL THUMB LOOP REG UNIV (MISCELLANEOUS) ×2
KIT PRC NS LF DISP ENDO (KITS) ×1 IMPLANT
KIT PROCEDURE OLYMPUS (KITS) ×1
MANIFOLD NEPTUNE II (INSTRUMENTS) ×1 IMPLANT
SNARE COLD EXACTO (MISCELLANEOUS) IMPLANT
TRAP ETRAP POLY (MISCELLANEOUS) IMPLANT
WATER STERILE IRR 250ML POUR (IV SOLUTION) ×1 IMPLANT

## 2023-02-16 NOTE — H&P (Signed)
Midge Minium, MD [facility] [address] [place], [place] [address] [phone] Fax : [phone]  Primary Care Physician:  Sherlene Shams, MD Primary Gastroenterologist:  Dr. Servando Snare  Pre-Procedure History & Physical: HPI:  Pamela Lara is a 62 y.o. female is here for an colonoscopy.   Past Medical History:  Diagnosis Date   Arthritis    right shoulder   BPPV (benign paroxysmal positional vertigo)    Congenital bowed legs    s/p  surgical correction   Diverticulitis, colon    recurrent   Dysplastic nevus 09/09/2018   R mid post thigh   Dysplastic nevus 07/09/2019   R buttock,moderate atypia   Dysplastic nevus 07/09/2019   L buttock, moderate atypia   Dysplastic nevus 07/09/2019   R lat popliteal, moderate atypia   Ectopic pregnancy 01/29/2000   GERD (gastroesophageal reflux disease)    watch diet (as needed takes apple cider vinager)   History of colonic diverticulitis    hx multiple acute diverticulitis episodes treated medically   History of ectopic pregnancy    History of Helicobacter pylori infection 2013  approx.   Melanoma (HCC) 04/19/2018   R prox bicep (MMIS)   PONV (postoperative nausea and vomiting)    Seasonal affective disorder (HCC)     Past Surgical History:  Procedure Laterality Date   CHOLECYSTECTOMY OPEN  1984   COLONOSCOPY WITH PROPOFOL N/A 04/19/2017   Procedure: COLONOSCOPY WITH PROPOFOL;  Surgeon: Midge Minium, MD;  Location: [facility];  Service: Endoscopy;  Laterality: N/A;   CYSTOSCOPY WITH STENT PLACEMENT Bilateral 08/15/2017   Procedure: CYSTOSCOPY WITH  FOLEY PLACEMENT WITH BILATERAL open ended catheter placement.  catheters removed at the end of the case.;  Surgeon: Crista Elliot, MD;  Location: [facility];  Service: Urology;  Laterality: Bilateral;   EXCISIONAL SENTINAL LYMPH NODE BIOPSY, NECK N/A 2016  approx.   per pt thyroid area , benign   LAPAROSCOPIC SIGMOID COLECTOMY N/A 08/15/2017   Procedure:  LAPAROSCOPIC SIGMOID COLECTOMY  ERAS PATHWAY;  Surgeon: Andria Meuse, MD;  Location: [facility];  Service: General;  Laterality: N/A;   LAPAROSCOPY FOR ECTOPIC PREGNANCY  2001   also  BILATERAL TUBAL LIGATION   OSTEOTOMY Bilateral 1980 and 1981   correction congenital bow legged   TONSILLECTOMY      Prior to Admission medications   Medication Sig Start Date End Date Taking? Authorizing Provider  calcium carbonate (TUMS - DOSED IN MG ELEMENTAL CALCIUM) 500 MG chewable tablet Chew 1 tablet by mouth as needed for indigestion or heartburn.   Yes [provider]  Cholecalciferol (VITAMIN D3) 1000 UNITS CAPS Take 1,000 Units by mouth daily.   Yes [provider]  influenza vac split quadrivalent PF (FLUARIX QUADRIVALENT) 0.5 ML injection Inject into the muscle. 02/16/22  Yes Judyann Munson, MD  [place] Ginseng 1000 MG TABS Take 1,000 mg by mouth daily.    Yes [provider]  Multiple Vitamin (MULTIVITAMIN) tablet Take 1 tablet by mouth daily.   Yes [provider]  Omega-3 Fatty Acids (FISH OIL) 1000 MG CAPS Take 1,000 mg by mouth daily.    Yes [provider]  Psyllium (METAMUCIL FIBER PO) Take by mouth.   Yes [provider]    Allergies as of 10/12/2022 - Review Complete 10/05/2022  Allergen Reaction Noted   Ivp dye [iodinated contrast media] Shortness Of Breath 12/21/2010   Morphine and codeine Nausea And Vomiting 12/21/2010   Other  08/15/2017  Wellbutrin [bupropion] Other (See Comments) 04/24/2014    Family History  Problem Relation Age of Onset   Cancer Mother    Arthritis Mother    Hypertension Mother    Cancer Father 68       Mesothelioma   Arthritis Father    Hypertension Father    Breast cancer Neg Hx     Social History   Socioeconomic History   Marital status: Married    Spouse name: Not on file   Number of children: Not on file   Years of education: Not on file   Highest education level: Not on file   Occupational History   Not on file  Tobacco Use   Smoking status: Former    Current packs/day: 0.00    Average packs/day: 0.3 packs/day for 38.0 years (9.5 ttl pk-yrs)    Types: Cigarettes    Start date: 06/29/1979    Quit date: 06/28/2017    Years since quitting: 5.6   Smokeless tobacco: Never   Tobacco comments:    since age 38/  07-27-2017 per pt quit smoking 02/ 28/ 2019 cold [place]  Vaping Use   Vaping status: Never Used  Substance and Sexual Activity   Alcohol use: Yes    Comment: occasional   Drug use: No   Sexual activity: Not on file  Other Topics Concern   Not on file  Social History Narrative   Not on file   Social Determinants of Health   Financial Resource Strain: Not on file  Food Insecurity: Not on file  Transportation Needs: Not on file  Physical Activity: Not on file  Stress: Not on file  Social Connections: Not on file  Intimate Partner Violence: Not on file    Review of Systems: See HPI, otherwise negative ROS  Physical Exam: BP 107/71   Temp (!) 97.4 F (36.3 C) (Temporal)   Resp 12   Ht 5\' 5"  (1.651 m)   Wt 80.7 kg   LMP 10/03/2015 (Approximate)   SpO2 95%   BMI 29.62 kg/m  General:   Alert,  pleasant and cooperative in NAD Head:  Normocephalic and atraumatic. Neck:  Supple; no masses or thyromegaly. Lungs:  Clear throughout to auscultation.    Heart:  Regular rate and rhythm. Abdomen:  Soft, nontender and nondistended. Normal bowel sounds, without guarding, and without rebound.   Neurologic:  Alert and  oriented x4;  grossly normal neurologically.  Impression/Plan: Gwenith Daily is here for an colonoscopy to be performed for a history of adenomatous polyps on 2018   Risks, benefits, limitations, and alternatives regarding  colonoscopy have been reviewed with the patient.  Questions have been answered.  All parties agreeable.   Midge Minium, MD  02/16/2023, 8:47 AM

## 2023-02-16 NOTE — Transfer of Care (Signed)
Immediate Anesthesia Transfer of Care Note  Patient: Pamela Lara  Procedure(s) Performed: COLONOSCOPY WITH PROPOFOL POLYPECTOMY  Patient Location: PACU  Anesthesia Type:General  Level of Consciousness: drowsy and patient cooperative  Airway & Oxygen Therapy: Patient Spontanous Breathing and Patient connected to nasal cannula oxygen  Post-op Assessment: Report given to RN, Post -op Vital signs reviewed and stable, and Patient moving all extremities X 4  Post vital signs: Reviewed and stable  Last Vitals:  Vitals Value Taken Time  BP    Temp    Pulse    Resp    SpO2      Last Pain:  Vitals:   02/16/23 0808  TempSrc: Temporal  PainSc: 0-No pain         Complications: No notable events documented.

## 2023-02-16 NOTE — Anesthesia Postprocedure Evaluation (Signed)
Anesthesia Post Note  Patient: Pamela Lara  Procedure(s) Performed: COLONOSCOPY WITH PROPOFOL POLYPECTOMY  Patient location during evaluation: PACU Anesthesia Type: General Level of consciousness: awake and alert Pain management: pain level controlled Vital Signs Assessment: post-procedure vital signs reviewed and stable Respiratory status: spontaneous breathing, nonlabored ventilation, respiratory function stable and patient connected to nasal cannula oxygen Cardiovascular status: blood pressure returned to baseline and stable Postop Assessment: no apparent nausea or vomiting Anesthetic complications: no   No notable events documented.   Last Vitals:  Vitals:   02/16/23 0808 02/16/23 0915  BP: 107/71 90/62  Resp: 12 15  Temp: (!) 36.3 C 36.4 C  SpO2: 95% 94%    Last Pain:  Vitals:   02/16/23 0915  TempSrc:   PainSc: Asleep                 Louie Boston

## 2023-02-16 NOTE — Anesthesia Preprocedure Evaluation (Signed)
Anesthesia Evaluation  Patient identified by MRN, date of birth, ID band Patient awake    Reviewed: Allergy & Precautions, NPO status , Patient's Chart, lab work & pertinent test results  History of Anesthesia Complications (+) PONV and history of anesthetic complications  Airway Mallampati: II  TM Distance: >3 FB Neck ROM: full    Dental no notable dental hx.    Pulmonary neg pulmonary ROS, former smoker   Pulmonary exam normal        Cardiovascular negative cardio ROS Normal cardiovascular exam     Neuro/Psych  PSYCHIATRIC DISORDERS  Depression    negative neurological ROS     GI/Hepatic Neg liver ROS,GERD  Controlled,,  Endo/Other  negative endocrine ROS    Renal/GU negative Renal ROS  negative genitourinary   Musculoskeletal  (+) Arthritis ,    Abdominal   Peds  Hematology negative hematology ROS (+)   Anesthesia Other Findings Past Medical History: No date: Arthritis     Comment:  right shoulder No date: BPPV (benign paroxysmal positional vertigo) No date: Congenital bowed legs     Comment:  s/p  surgical correction No date: Diverticulitis, colon     Comment:  recurrent 09/09/2018: Dysplastic nevus     Comment:  R mid post thigh 07/09/2019: Dysplastic nevus     Comment:  R buttock,moderate atypia 07/09/2019: Dysplastic nevus     Comment:  L buttock, moderate atypia 07/09/2019: Dysplastic nevus     Comment:  R lat popliteal, moderate atypia 01/29/2000: Ectopic pregnancy No date: GERD (gastroesophageal reflux disease)     Comment:  watch diet (as needed takes apple cider vinager) No date: History of colonic diverticulitis     Comment:  hx multiple acute diverticulitis episodes treated               medically No date: History of ectopic pregnancy 2013  approx.: History of Helicobacter pylori infection 04/19/2018: Melanoma (HCC)     Comment:  R prox bicep (MMIS) No date: PONV (postoperative nausea  and vomiting) No date: Seasonal affective disorder (HCC)  Past Surgical History: 1984: CHOLECYSTECTOMY OPEN 04/19/2017: COLONOSCOPY WITH PROPOFOL; N/A     Comment:  Procedure: COLONOSCOPY WITH PROPOFOL;  Surgeon: Midge Minium, MD;  Location: [facility];  Service:               Endoscopy;  Laterality: N/A; 08/15/2017: CYSTOSCOPY WITH STENT PLACEMENT; Bilateral     Comment:  Procedure: CYSTOSCOPY WITH  FOLEY PLACEMENT WITH               BILATERAL open ended catheter placement.  catheters               removed at the end of the case.;  Surgeon: Crista Elliot, MD;  Location: [facility];  Service: Urology;                Laterality: Bilateral; 2016  approx.: EXCISIONAL SENTINAL LYMPH NODE BIOPSY, NECK; N/A     Comment:  per pt thyroid area , benign 08/15/2017: LAPAROSCOPIC SIGMOID COLECTOMY; N/A     Comment:  Procedure: LAPAROSCOPIC SIGMOID COLECTOMY  ERAS PATHWAY;              Surgeon: Andria Meuse, MD;  Location: [facility];  Service: General;  Laterality: N/A; 2001: LAPAROSCOPY FOR ECTOPIC PREGNANCY     Comment:  also  BILATERAL TUBAL LIGATION 1980 and 1981: OSTEOTOMY; Bilateral     Comment:  correction congenital bow legged No date: TONSILLECTOMY  BMI    Body Mass Index: 29.79 kg/m      Reproductive/Obstetrics negative OB ROS                              Anesthesia Physical Anesthesia Plan  ASA: 2  Anesthesia Plan: General   Post-op Pain Management: Minimal or no pain anticipated   Induction: Intravenous  PONV Risk Score and Plan: 3 and Propofol infusion and TIVA  Airway Management Planned: Natural Airway and Nasal Cannula  Additional Equipment:   Intra-op Plan:   Post-operative Plan:   Informed Consent: I have reviewed the patients History and Physical, chart, labs and discussed the procedure including the risks, benefits and alternatives for the proposed anesthesia with the patient  or authorized representative who has indicated his/her understanding and acceptance.     Dental Advisory Given  Plan Discussed with: Anesthesiologist, CRNA and Surgeon  Anesthesia Plan Comments: (Patient consented for risks of anesthesia including but not limited to:  - adverse reactions to medications - risk of airway placement if required - damage to eyes, teeth, lips or other oral mucosa - nerve damage due to positioning  - sore throat or hoarseness - Damage to heart, brain, nerves, lungs, other parts of body or loss of life  Patient voiced understanding and assent.)         Anesthesia Quick Evaluation

## 2023-02-16 NOTE — Op Note (Signed)
[facility] Gastroenterology Patient Name: Pamela Lara Procedure Date: 02/16/2023 8:51 AM MRN: [id number] Account #: [id number] Date of Birth: 10/16/1960 Admit Type: Outpatient Age: 62 Room: [facility] OR ROOM 01 Gender: Female Note Status: Finalized Instrument Name: [id number] Procedure:             Colonoscopy Indications:           High risk colon cancer surveillance: Personal history                         of colonic polyps Providers:             Midge Minium MD, MD Referring MD:          Duncan Dull, MD (Referring MD) Medicines:             Propofol per Anesthesia Complications:         No immediate complications. Procedure:             Pre-Anesthesia Assessment:                        - Prior to the procedure, a History and Physical was                         performed, and patient medications and allergies were                         reviewed. The patient's tolerance of previous                         anesthesia was also reviewed. The risks and benefits                         of the procedure and the sedation options and risks                         were discussed with the patient. All questions were                         answered, and informed consent was obtained. Prior                         Anticoagulants: The patient has taken no anticoagulant                         or antiplatelet agents. ASA Grade Assessment: II - A                         patient with mild systemic disease. After reviewing                         the risks and benefits, the patient was deemed in                         satisfactory condition to undergo the procedure.                        After obtaining informed consent, the colonoscope was  passed under direct vision. Throughout the procedure,                         the patient's blood pressure, pulse, and oxygen                         saturations were monitored continuously. The                          Colonoscope was introduced through the anus and                         advanced to the the cecum, identified by appendiceal                         orifice and ileocecal valve. The colonoscopy was                         performed without difficulty. The patient tolerated                         the procedure well. The quality of the bowel                         preparation was excellent. Findings:      The perianal and digital rectal examinations were normal.      A 4 mm polyp was found in the ascending colon. The polyp was sessile.       The polyp was removed with a cold snare. Resection and retrieval were       complete.      Two sessile polyps were found in the sigmoid colon. The polyps were 3 to       5 mm in size. These polyps were removed with a cold snare. Resection and       retrieval were complete.      Non-bleeding internal hemorrhoids were found during retroflexion. The       hemorrhoids were Grade I (internal hemorrhoids that do not prolapse). Impression:            - One 4 mm polyp in the ascending colon, removed with                         a cold snare. Resected and retrieved.                        - Two 3 to 5 mm polyps in the sigmoid colon, removed                         with a cold snare. Resected and retrieved.                        - Non-bleeding internal hemorrhoids. Recommendation:        - Discharge patient to home.                        - Resume previous diet.                        - Continue present medications.                        -  Await pathology results.                        - Repeat colonoscopy in 5 years for surveillance. Procedure Code(s):     --- Professional ---                        [phone], Colonoscopy, flexible; with removal of                         tumor(s), polyp(s), or other lesion(s) by snare                         technique Diagnosis Code(s):     --- Professional ---                        Z86.010, Personal history of  colonic polyps                        D12.5, Benign neoplasm of sigmoid colon CPT copyright 2022 [employer]. All rights reserved. The codes documented in this report are preliminary and upon coder review may  be revised to meet current compliance requirements. Midge Minium MD, MD 02/16/2023 9:14:24 AM This report has been signed electronically. Number of Addenda: 0 Note Initiated On: 02/16/2023 8:51 AM Scope Withdrawal Time: 0 hours 7 minutes 26 seconds  Total Procedure Duration: 0 hours 12 minutes 55 seconds  Estimated Blood Loss:  Estimated blood loss: none.      [facility]

## 2023-02-17 ENCOUNTER — Encounter: Payer: Self-pay | Admitting: Gastroenterology

## 2023-02-19 LAB — SURGICAL PATHOLOGY

## 2023-02-20 ENCOUNTER — Encounter: Payer: Self-pay | Admitting: Gastroenterology

## 2023-05-17 ENCOUNTER — Ambulatory Visit: Payer: Commercial Managed Care - PPO | Admitting: Dermatology

## 2023-06-07 ENCOUNTER — Ambulatory Visit: Payer: Commercial Managed Care - PPO | Admitting: Dermatology

## 2023-06-07 DIAGNOSIS — D2272 Melanocytic nevi of left lower limb, including hip: Secondary | ICD-10-CM

## 2023-06-07 DIAGNOSIS — Z86006 Personal history of melanoma in-situ: Secondary | ICD-10-CM

## 2023-06-07 DIAGNOSIS — D229 Melanocytic nevi, unspecified: Secondary | ICD-10-CM

## 2023-06-07 DIAGNOSIS — W908XXA Exposure to other nonionizing radiation, initial encounter: Secondary | ICD-10-CM

## 2023-06-07 DIAGNOSIS — Z86018 Personal history of other benign neoplasm: Secondary | ICD-10-CM

## 2023-06-07 DIAGNOSIS — L821 Other seborrheic keratosis: Secondary | ICD-10-CM

## 2023-06-07 DIAGNOSIS — D225 Melanocytic nevi of trunk: Secondary | ICD-10-CM | POA: Diagnosis not present

## 2023-06-07 DIAGNOSIS — L820 Inflamed seborrheic keratosis: Secondary | ICD-10-CM

## 2023-06-07 DIAGNOSIS — Z7189 Other specified counseling: Secondary | ICD-10-CM | POA: Diagnosis not present

## 2023-06-07 DIAGNOSIS — L719 Rosacea, unspecified: Secondary | ICD-10-CM

## 2023-06-07 DIAGNOSIS — L814 Other melanin hyperpigmentation: Secondary | ICD-10-CM

## 2023-06-07 DIAGNOSIS — D1801 Hemangioma of skin and subcutaneous tissue: Secondary | ICD-10-CM

## 2023-06-07 DIAGNOSIS — L578 Other skin changes due to chronic exposure to nonionizing radiation: Secondary | ICD-10-CM

## 2023-06-07 DIAGNOSIS — Z1283 Encounter for screening for malignant neoplasm of skin: Secondary | ICD-10-CM

## 2023-06-07 NOTE — Progress Notes (Signed)
 Follow-Up Visit   Subjective  Pamela Lara is a 63 y.o. 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 Pt has spots on back that were treated with LN2 last year she would like looked at- she thinks they are improved, not bothersome.    The patient presents for Total-Body Skin Exam (TBSE) for skin cancer screening and mole check. The patient has spots, moles and lesions to be evaluated, some may be new or changing and the patient may have concern these could be cancer.   The following portions of the chart were reviewed this encounter and updated as appropriate: medications, allergies, medical history  Review of Systems:  No other skin or systemic complaints except as noted in HPI or Assessment and Plan.  Objective  Well appearing patient in no apparent distress; mood and affect are within normal limits.  A full examination was performed including scalp, head, eyes, ears, nose, lips, neck, chest, axillae, abdomen, back, buttocks, bilateral upper extremities, bilateral lower extremities, hands, feet, fingers, toes, fingernails, and toenails. All findings within normal limits unless otherwise noted below.   Relevant physical exam findings are noted in the Assessment and Plan.  L upper eyebrow 1cm waxy like tan patch   Assessment & Plan   SKIN CANCER SCREENING PERFORMED TODAY.  ACTINIC DAMAGE - Chronic condition, secondary to cumulative UV/sun exposure - diffuse scaly erythematous macules with underlying dyspigmentation - Recommend daily broad spectrum sunscreen SPF 30+ to sun-exposed areas, reapply every 2 hours as needed.  - Staying in the shade or wearing long sleeves, sun glasses (UVA+UVB protection) and wide brim hats (4-inch brim around the entire circumference of the hat) are also recommended for sun protection.  - Call for new or changing lesions.  LENTIGINES, SEBORRHEIC  KERATOSES, HEMANGIOMAS - Benign normal skin lesions - Benign-appearing - Call for any changes  MELANOCYTIC NEVI - Tan-brown and/or pink-flesh-colored symmetric macules and papules - 2mm medium dark brown macule at R chest - 2mm brown macule at L 5th toe - Benign appearing on exam today - Observation - Call clinic for new or changing moles - Recommend daily use of broad spectrum spf 30+ sunscreen to sun-exposed areas.   History of Melanoma in Situ 2019 - No evidence of recurrence today R upper arm - Recommend regular full body skin exams - Recommend daily broad spectrum sunscreen SPF 30+ to sun-exposed areas, reapply every 2 hours as needed.  - Call if any new or changing lesions are noted between office visits     History of Dysplastic Nevi - No evidence of recurrence today - Recommend regular full body skin exams - Recommend daily broad spectrum sunscreen SPF 30+ to sun-exposed areas, reapply every 2 hours as needed.  - Call if any new or changing lesions are noted between office visits  ROSACEA Exam: Mid face erythema   Chronic and persistent condition with duration or expected duration over one year.   Rosacea is a chronic progressive skin condition usually affecting the face of adults, causing redness and/or acne bumps. It is treatable but not curable. It sometimes affects the eyes (ocular rosacea) as well. It may respond to topical and/or systemic medication and can flare with stress, sun exposure, alcohol, exercise, topical steroids (including hydrocortisone/cortisone 10) and some foods.  Daily application of broad spectrum spf 30+ sunscreen to face is recommended to reduce flares.   Treatment Plan Mild, none needed.  Discussed BBL for redness  Counseling for BBL / IPL / Laser and Coordination of Care Discussed the treatment option of Broad Band Light (BBL) /Intense Pulsed Light (IPL)/ Laser for skin discoloration, including brown spots and redness.  Typically we  recommend at least 1-3 treatment sessions about 5-8 weeks apart for best results.  Cannot have tanned skin when BBL performed, and regular use of sunscreen/photoprotection is advised after the procedure to help maintain results. The patient's condition may also require maintenance treatments in the future.  The fee for BBL / laser treatments is $350 per treatment session for the whole face.  A fee can be quoted for other parts of the body.  Insurance typically does not pay for BBL/laser treatments and therefore the fee is an out-of-pocket cost. Recommend prophylactic valtrex treatment. Once scheduled for procedure, will send Rx in prior to patient's appointment.     INFLAMED SEBORRHEIC KERATOSIS L upper eyebrow Reassured benign age-related growth.  Recommend observation.  Discussed cryotherapy if spot(s) become irritated or inflamed.   Previously frozen in past.  Patient will monitor since not bothersome.  Return for w/ Dr. Hester, TBSE.  I, Jacquelynn Vera, CMA, am acting as scribe for Rexene Rattler, MD .   Documentation: I have reviewed the above documentation for accuracy and completeness, and I agree with the above.  Rexene Rattler, MD

## 2023-06-07 NOTE — Patient Instructions (Signed)

## 2023-07-26 DIAGNOSIS — M25562 Pain in left knee: Secondary | ICD-10-CM | POA: Diagnosis not present

## 2023-07-26 DIAGNOSIS — M79632 Pain in left forearm: Secondary | ICD-10-CM | POA: Diagnosis not present

## 2023-08-21 ENCOUNTER — Ambulatory Visit: Admitting: Internal Medicine

## 2023-09-24 ENCOUNTER — Emergency Department

## 2023-09-24 ENCOUNTER — Ambulatory Visit
Admission: EM | Admit: 2023-09-24 | Discharge: 2023-09-24 | Attending: Emergency Medicine | Admitting: Emergency Medicine

## 2023-09-24 ENCOUNTER — Other Ambulatory Visit: Payer: Self-pay

## 2023-09-24 ENCOUNTER — Emergency Department
Admission: EM | Admit: 2023-09-24 | Discharge: 2023-09-24 | Disposition: A | Discharge status: Home / Self Care | Attending: Emergency Medicine | Admitting: Emergency Medicine

## 2023-09-24 ENCOUNTER — Encounter: Payer: Self-pay | Admitting: Emergency Medicine

## 2023-09-24 DIAGNOSIS — R0602 Shortness of breath: Secondary | ICD-10-CM | POA: Insufficient documentation

## 2023-09-24 DIAGNOSIS — I200 Unstable angina: Secondary | ICD-10-CM | POA: Diagnosis not present

## 2023-09-24 DIAGNOSIS — K5900 Constipation, unspecified: Secondary | ICD-10-CM | POA: Insufficient documentation

## 2023-09-24 DIAGNOSIS — R079 Chest pain, unspecified: Secondary | ICD-10-CM | POA: Diagnosis not present

## 2023-09-24 DIAGNOSIS — R0789 Other chest pain: Secondary | ICD-10-CM | POA: Insufficient documentation

## 2023-09-24 DIAGNOSIS — I700 Atherosclerosis of aorta: Secondary | ICD-10-CM | POA: Diagnosis not present

## 2023-09-24 DIAGNOSIS — J9811 Atelectasis: Secondary | ICD-10-CM | POA: Diagnosis not present

## 2023-09-24 LAB — TROPONIN I (HIGH SENSITIVITY)
Troponin I (High Sensitivity): 15 ng/L (ref <18)
Troponin I (High Sensitivity): 14 ng/L (ref <18)

## 2023-09-24 LAB — BASIC METABOLIC PANEL WITH GFR
Anion gap: 9 (ref 5–15)
BUN: 13 mg/dL (ref 8–23)
CO2: 24 mmol/L (ref 22–32)
Calcium: 8.6 mg/dL — ABNORMAL LOW (ref 8.9–10.3)
Chloride: 106 mmol/L (ref 98–111)
Creatinine, Ser: 0.71 mg/dL (ref 0.44–1.00)
GFR, Estimated: >60 mL/min (ref >60)
Glucose, Bld: 90 mg/dL (ref 70–99)
Potassium: 4.1 mmol/L (ref 3.5–5.1)
Sodium: 139 mmol/L (ref 135–145)

## 2023-09-24 LAB — CBC
HCT: 41.6 % (ref 36.0–46.0)
Hemoglobin: 14.9 g/dL (ref 12.0–15.0)
MCH: 31 pg (ref 26.0–34.0)
MCHC: 35.8 g/dL (ref 30.0–36.0)
MCV: 86.7 fL (ref 80.0–100.0)
Platelets: 195 10*3/uL (ref 150–400)
RBC: 4.8 MIL/uL (ref 3.87–5.11)
RDW: 12.2 % (ref 11.5–15.5)
WBC: 6.5 10*3/uL (ref 4.0–10.5)
nRBC: 0 % (ref 0.0–0.2)

## 2023-09-24 MED ORDER — METHYLPREDNISOLONE SODIUM SUCC 40 MG IJ SOLR
40.0000 mg | Freq: Once | INTRAMUSCULAR | Status: AC
  Administered 2023-09-24: 40 mg via INTRAVENOUS
  Filled 2023-09-24: qty 1

## 2023-09-24 MED ORDER — DIPHENHYDRAMINE HCL 25 MG PO CAPS: 50.0000 mg | ORAL_CAPSULE | Freq: Once | ORAL | Status: AC

## 2023-09-24 MED ORDER — NITROGLYCERIN 0.4 MG SL SUBL
0.4000 mg | SUBLINGUAL_TABLET | Freq: Once | SUBLINGUAL | Status: AC
  Administered 2023-09-24: 0.4 mg via SUBLINGUAL
  Filled 2023-09-24: qty 1

## 2023-09-24 MED ORDER — DIPHENHYDRAMINE HCL 50 MG/ML IJ SOLN
50.0000 mg | Freq: Once | INTRAMUSCULAR | Status: AC
  Administered 2023-09-24: 50 mg via INTRAVENOUS
  Filled 2023-09-24: qty 1

## 2023-09-24 MED ORDER — IOHEXOL 350 MG/ML SOLN
75.0000 mL | Freq: Once | INTRAVENOUS | Status: AC | PRN
  Administered 2023-09-24: 75 mL via INTRAVENOUS

## 2023-09-24 NOTE — ED Notes (Signed)
 Patient is being discharged from the Urgent Care and sent to the Emergency Department via PV . Per Defelice, Eveleen Hinds, NP , patient is in need of higher level of care due to chest pain. Patient is aware and verbalizes understanding of plan of care.  Vitals:   09/24/23 1133  BP: 137/83  Pulse: 69  Resp: 16  Temp: 98.7 F (37.1 C)  SpO2: 97%

## 2023-09-24 NOTE — ED Provider Notes (Addendum)
 6:47 PM Assumed care for off going team.   Blood pressure (!) 119/100, pulse 78, temperature 98.4 F (36.9 C), temperature source Oral, resp. rate (!) 24, height 5\' 5"  (1.651 m), weight 81.6 kg, last menstrual period 10/03/2015, SpO2 96%.  See their HPI for full report but in brief pending CT PE/Trop  IMPRESSION:  1. No filling defect is identified in the pulmonary arterial tree to  suggest pulmonary embolus.  2. Mild dependent atelectasis in both lower lobes.  3. Aberrant right subclavian artery passes behind the esophagus.  4.  Aortic Atherosclerosis (ICD10-I70.0).    6:50 PM patient continues to do well.  She denies any pain.  She reports that she thought it was related to more of acid or GI issues.  She states that she wanted to make sure was not a blood clot because of her recent long travel.  She denies any history of heart problems in the past.  We discussed the concern this could be related to her heart.  Her heart score is in the low category however her chest pain that was relieved by nitro and given she had some T wave inversions I offered her admission for cardiac monitoring, possible stress test with cardiology evaluation.  I discussed with patient I cannot predict if she has narrowing that can put her at high risk for heart attack although I do not see any active heart attack today I cannot predict the future.  Patient preferred to follow-up outpatient.  She reports that she just want to make sure it was not a blood clot.  She reports walking 4 miles daily and not have any chest pain and states that she would prefer to go home today.  She will follow-up with cardiology and will return if she develops return of symptoms or worsening symptoms for repeat evaluation as she understands that I cannot predict future heart attacks  7:31 PM d/w Dr Beau Bound- EKG non concerning for wellens okay for outpatinet follow up and that is what patient prefers.    Lubertha Rush, MD 09/24/23 1851     Lubertha Rush, MD 09/24/23 [phone]

## 2023-09-24 NOTE — Discharge Instructions (Addendum)
 No signs of active heart attack however we do want you to follow-up outpatient with cardiology to evaluate for narrowing in your vessels that could put you at high risk for heart attack.  Return to the ER if you develop return of symptoms, worsening symptoms or any other concerns  IMPRESSION: 1. No filling defect is identified in the pulmonary arterial tree to suggest pulmonary embolus. 2. Mild dependent atelectasis in both lower lobes. 3. Aberrant right subclavian artery passes behind the esophagus. 4.  Aortic Atherosclerosis (ICD10-I70.0).

## 2023-09-24 NOTE — ED Triage Notes (Signed)
 Patient states that she traveled last week. 2 days trip in car. Had chest pain when she got there. Had BM and it went away. Pain is in upper right chest and shoulder.

## 2023-09-24 NOTE — ED Triage Notes (Signed)
 Pt via POV from home. Pt c/o intermittent R sided CP that radiates to the R shoulder that started on Tuesday. Reports that she took at 324 ASA, denies cardiac hx. Reports recent long distance travel this past week. Report pain as a pressure. States she was seen at [facility] and EKG was done and they reported some abnormalities and told her to come here. Pt is A&Ox4 and NAD, ambulatory to room with steady gait.

## 2023-09-24 NOTE — ED Notes (Signed)
 Pt declined updated vitals at discharge.

## 2023-09-24 NOTE — Discharge Instructions (Signed)
 Go to ER for further evaluation Do not  eat or drink anything prior to evaluation by provider

## 2023-09-24 NOTE — ED Provider Notes (Signed)
 [facility] Provider Note    Event Date/Time   First MD Initiated Contact with Patient 09/24/23 1220     (approximate)   History   Chest Pain   HPI Pamela Lara is a 63 y.o. female presenting today for right-sided chest pain with radiation to her shoulder ongoing over the past 5 days.  Patient notes prior to this she had recent long travel to the [place] where she had 2 separate days of travel up and back lasting the entire day in a car.  She initially thought it may have been reflux or related to constipation.  Did not resolve with bowel movements or Tums/omeprazole .  Pain worse with exertion.  Intermittent shortness of breath.  Otherwise denies cough, congestion, abdominal pain, nausea, vomiting, leg pain, leg swelling.  No prior history of blood clots or other cardiac history.     Physical Exam   Triage Vital Signs: ED Triage Vitals  Encounter Vitals Group     BP 09/24/23 1225 (!) 142/80     Systolic BP Percentile --      Diastolic BP Percentile --      Pulse Rate 09/24/23 1225 69     Resp 09/24/23 1225 18     Temp 09/24/23 1225 98.4 F (36.9 C)     Temp Source 09/24/23 1225 Oral     SpO2 09/24/23 1225 98 %     Weight 09/24/23 1222 180 lb (81.6 kg)     Height 09/24/23 1222 5\' 5"  (1.651 m)     Head Circumference --      Peak Flow --      Pain Score 09/24/23 1221 2     Pain Loc --      Pain Education --      Exclude from Growth Chart --     Most recent vital signs: Vitals:   09/24/23 1300 09/24/23 1330  BP: 92/76 124/80  Pulse: 75 64  Resp: 17 13  Temp:    SpO2: 97% 99%   Physical Exam: I have reviewed the vital signs and nursing notes. General: Awake, alert, no acute distress.  Nontoxic appearing. Head:  Atraumatic, normocephalic.   ENT:  EOM intact, PERRL. Oral mucosa is pink and moist with no lesions. Neck: Neck is supple with full range of motion, No meningeal signs. Cardiovascular:  RRR, No murmurs. Peripheral pulses  palpable and equal bilaterally. Respiratory:  Symmetrical chest wall expansion.  No rhonchi, rales, or wheezes.  Good air movement throughout.  No use of accessory muscles.   Musculoskeletal:  No cyanosis or edema. No TTP over bilateral calves. Moving extremities with full ROM Abdomen:  Soft, nontender, nondistended. Neuro:  GCS 15, moving all four extremities, interacting appropriately. Speech clear. Psych:  Calm, appropriate.   Skin:  Warm, dry, no rash.    ED Results / Procedures / Treatments   Labs (all labs ordered are listed, but only abnormal results are displayed) Labs Reviewed  BASIC METABOLIC PANEL WITH GFR - Abnormal; Notable for the following components:      Result Value   Calcium 8.6 (*)    All other components within normal limits  CBC  TROPONIN I (HIGH SENSITIVITY)  TROPONIN I (HIGH SENSITIVITY)     EKG My EKG interpretation: Rate of 63, normal sinus rhythm, normal axis, normal intervals.  T wave inversions present in V2, V3, V4.  These appear new from most recent EKG in 2019.   RADIOLOGY Independently interpreted chest x-ray with  no acute pathology   PROCEDURES:  Critical Care performed: No  Procedures   MEDICATIONS ORDERED IN ED: Medications  diphenhydrAMINE  (BENADRYL ) capsule 50 mg (has no administration in time range)    Or  diphenhydrAMINE  (BENADRYL ) injection 50 mg (has no administration in time range)  methylPREDNISolone sodium succinate (SOLU-MEDROL) 40 mg/mL injection 40 mg (40 mg Intravenous Given 09/24/23 1256)  nitroGLYCERIN (NITROSTAT) SL tablet 0.4 mg (0.4 mg Sublingual Given 09/24/23 1255)     IMPRESSION / MDM / ASSESSMENT AND PLAN / ED COURSE  I reviewed the triage vital signs and the nursing notes.                              Differential diagnosis includes, but is not limited to, ACS, PE, pneumonia, musculoskeletal pain  Patient's presentation is most consistent with acute presentation with potential threat to life or bodily  function.  Patient is a 63 year old female presenting today for right-sided chest pain worse with exertion following recent long travel.  Highest concern is for ACS or PE.  EKG does show new T wave inversions in V2, V3, V4.  Vital signs otherwise stable and physical exam unremarkable at this time.  Will get serial troponins as well as CTA chest to evaluate for PE. Patient requiring pre-treatment given iodine contrast allergy.  Initial troponin at 15 and will get repeat at 2 hours for comparison.  Chest x-ray negative.  CBC and BMP otherwise reassuring.  Patient did note improvement with resolution of her chest pain following sublingual nitro possibly indicating more cardiac etiology.  Vital signs remained stable.  Signed out pending repeat troponin and CTA of chest along with reevaluation.  The patient is on the cardiac monitor to evaluate for evidence of arrhythmia and/or significant heart rate changes. Clinical Course as of 09/24/23 1435  Mon Sep 24, 2023  1325 Patient with improvement in chest pain symptoms following nitro. [DW]    Clinical Course User Index [DW] Kandee Orion, MD     FINAL CLINICAL IMPRESSION(S) / ED DIAGNOSES   Final diagnoses:  Chest pain, unspecified type     Rx / DC Orders   ED Discharge Orders     None        Note:  This document was prepared using Dragon voice recognition software and may include unintentional dictation errors.   Kandee Orion, MD 09/24/23 [phone]

## 2023-09-24 NOTE — ED Provider Notes (Signed)
 [facility]    CSN: [id number] Arrival date & time: 09/24/23  1113      History   Chief Complaint Chief Complaint  Patient presents with   Chest Pain   Shoulder Pain    HPI DOREATHER HOXWORTH is a 63 y.o. female.   Shawntina Diffee, 63 year old female, presents to urgent care for evaluation of right sided chest pain that radiates to right shoulder for last week.  Patient states she had a 2-day trip up [place] riding in a car.  Patient states that when she first had the chest pain she was walking when it occurred, rates it as 8 out of 10 when occurs, states she had a BM after walk and pain subsided, has had 2 other episodes of chest pain and still rates it 8 out of 10 when it occurs.  Pain is not reproducible with touch or palpation, currently denies chest pain or palpitations, no trauma or injury, has taken tums and 325 mg of Aspirin for symptoms  PMH: GERD, H pylori,former smoker(quit 2019), drinks occasionally,denies drug use, works at [employer]  The history is provided by the patient. No language interpreter was used.    Past Medical History:  Diagnosis Date   Arthritis    right shoulder   BPPV (benign paroxysmal positional vertigo)    Congenital bowed legs    s/p  surgical correction   Diverticulitis, colon    recurrent   Dysplastic nevus 09/09/2018   R mid post thigh   Dysplastic nevus 07/09/2019   R buttock,moderate atypia   Dysplastic nevus 07/09/2019   L buttock, moderate atypia   Dysplastic nevus 07/09/2019   R lat popliteal, moderate atypia   Ectopic pregnancy 01/29/2000   GERD (gastroesophageal reflux disease)    watch diet (as needed takes apple cider vinager)   History of colonic diverticulitis    hx multiple acute diverticulitis episodes treated medically   History of ectopic pregnancy    History of Helicobacter pylori infection 2013  approx.   Melanoma (HCC) 04/19/2018   R prox bicep (MMIS)   PONV (postoperative nausea and  vomiting)    Seasonal affective disorder [facility])     Patient Active Problem List   Diagnosis Date Noted   Intermittent right-sided chest pain 09/24/2023   Tubular adenoma of colon 10/05/2022   Hypertriglyceridemia without hypercholesterolemia 09/22/2020   Melanoma of skin (HCC) 10/09/2019   History of 2019 novel coronavirus disease (COVID-19) 04/19/2019   Colon cancer screening    Polyp of sigmoid colon    Rectal polyp    Hepatic steatosis 08/12/2016   S/P partial colectomy 04/11/2016   Left thyroid  nodule 04/30/2014   Cervical cancer screening 04/05/2013   GERD (gastroesophageal reflux disease) 04/24/2012   External hemorrhoid 04/24/2012   Encounter for preventive health examination 03/29/2012   History of tobacco abuse 12/21/2010   S/P tubal ligation 01/29/2000   S/P chole 11/30/1983    Past Surgical History:  Procedure Laterality Date   CHOLECYSTECTOMY OPEN  1984   COLONOSCOPY WITH PROPOFOL  N/A 04/19/2017   Procedure: COLONOSCOPY WITH PROPOFOL ;  Surgeon: Marnee Sink, MD;  Location: [facility];  Service: Endoscopy;  Laterality: N/A;   COLONOSCOPY WITH PROPOFOL  N/A 02/16/2023   Procedure: COLONOSCOPY WITH PROPOFOL ;  Surgeon: Marnee Sink, MD;  Location: [facility];  Service: Endoscopy;  Laterality: N/A;   CYSTOSCOPY WITH STENT PLACEMENT Bilateral 08/15/2017   Procedure: CYSTOSCOPY WITH  FOLEY PLACEMENT WITH BILATERAL open ended catheter placement.  catheters removed at the end of the case.;  Surgeon: Samson Croak, MD;  Location: [facility];  Service: Urology;  Laterality: Bilateral;   EXCISIONAL SENTINAL LYMPH NODE BIOPSY, NECK N/A 2016  approx.   per pt thyroid  area , benign   LAPAROSCOPIC SIGMOID COLECTOMY N/A 08/15/2017   Procedure: LAPAROSCOPIC SIGMOID COLECTOMY  ERAS PATHWAY;  Surgeon: Melvenia Stabs, MD;  Location: [facility];  Service: General;  Laterality: N/A;   LAPAROSCOPY FOR ECTOPIC PREGNANCY  2001   also  BILATERAL TUBAL LIGATION   OSTEOTOMY  Bilateral 1980 and 1981   correction congenital bow legged   POLYPECTOMY N/A 02/16/2023   Procedure: POLYPECTOMY;  Surgeon: Marnee Sink, MD;  Location: [facility];  Service: Endoscopy;  Laterality: N/A;   TONSILLECTOMY      OB History   No obstetric history on file.      Home Medications    Prior to Admission medications   Medication Sig Start Date End Date Taking? Authorizing Provider  calcium carbonate (TUMS - DOSED IN MG ELEMENTAL CALCIUM) 500 MG chewable tablet Chew 1 tablet by mouth as needed for indigestion or heartburn.   Yes [provider]  Cholecalciferol (VITAMIN D3) 1000 UNITS CAPS Take 1,000 Units by mouth daily.   Yes [provider]  influenza vac split quadrivalent PF (FLUARIX QUADRIVALENT ) 0.5 ML injection Inject into the muscle. 02/16/22  Yes Liane Redman, MD  Korean Ginseng 1000 MG TABS Take 1,000 mg by mouth daily.    Yes [provider]  Multiple Vitamin (MULTIVITAMIN) tablet Take 1 tablet by mouth daily.   Yes [provider]  Omega-3 Fatty Acids (FISH OIL) 1000 MG CAPS Take 1,000 mg by mouth daily.    Yes [provider]  Psyllium (METAMUCIL FIBER PO) Take by mouth.   Yes [provider]    Family History Family History  Problem Relation Age of Onset   Cancer Mother    Arthritis Mother    Hypertension Mother    Cancer Father 39       Mesothelioma   Arthritis Father    Hypertension Father    Breast cancer Neg Hx     Social History Social History   Tobacco Use   Smoking status: Former    Current packs/day: 0.00    Average packs/day: 0.3 packs/day for 38.0 years (9.5 ttl pk-yrs)    Types: Cigarettes    Start date: 06/29/1979    Quit date: 06/28/2017    Years since quitting: 6.2   Smokeless tobacco: Never   Tobacco comments:    since age 35/  07-27-2017 per pt quit smoking 02/ 28/ 2019 cold [place]  Vaping Use   Vaping status: Never Used  Substance Use Topics   Alcohol use: Yes     Comment: occasional   Drug use: No     Allergies   Ivp dye [iodinated contrast media], Morphine and codeine, Other, and Wellbutrin  [bupropion ]   Review of Systems Review of Systems  Constitutional:  Negative for fever.  Respiratory:  Negative for cough and shortness of breath.   Cardiovascular:  Positive for chest pain. Negative for palpitations.  Musculoskeletal:  Negative for arthralgias, joint swelling and myalgias.  All other systems reviewed and are negative.    Physical Exam Triage Vital Signs ED Triage Vitals  Encounter Vitals Group     BP      Systolic BP Percentile      Diastolic BP Percentile  Pulse      Resp      Temp      Temp src      SpO2      Weight      Height      Head Circumference      Peak Flow      Pain Score      Pain Loc      Pain Education      Exclude from Growth Chart    No data found.  Updated Vital Signs BP 137/83 (BP Location: Right Arm)   Pulse 69   Temp 98.7 F (37.1 C) (Oral)   Resp 16   LMP 10/03/2015 (Approximate)   SpO2 97%   Visual Acuity Right Eye Distance:   Left Eye Distance:   Bilateral Distance:    Right Eye Near:   Left Eye Near:    Bilateral Near:     Physical Exam Vitals and nursing note reviewed.  Constitutional:      Appearance: She is well-developed and well-groomed.  Cardiovascular:     Rate and Rhythm: Normal rate and regular rhythm.     Heart sounds: Normal heart sounds.  Pulmonary:     Effort: Pulmonary effort is normal.  Neurological:     General: No focal deficit present.     Mental Status: She is alert and oriented to person, place, and time.  Psychiatric:        Attention and Perception: Attention normal.        Mood and Affect: Mood normal.        Speech: Speech normal.        Behavior: Behavior normal. Behavior is cooperative.      UC Treatments / Results  Labs (all labs ordered are listed, but only abnormal results are displayed) Labs Reviewed - No data to  display  EKG   Radiology    Procedures Procedures (including critical care time)  Medications Ordered in UC Medications - No data to display  Initial Impression / Assessment and Plan / UC Course  I have reviewed the triage vital signs and the nursing notes.  Pertinent labs & imaging results that were available during my care of the patient were reviewed by me and considered in my medical decision making (see chart for details).  Clinical Course as of 09/24/23 1848  Mon Sep 24, 2023  1149 EKG today shows NSR rate 71, when compared to previous EKG 06/2017 , noted EKG changes V4 (inverted t wave today) [JD]    Clinical Course User Index [JD] Natalye Kott, Eveleen Hinds, NP    Ddx: Intermittent right sided chest pain, ACS, PE, muscle strain Final Clinical Impressions(s) / UC Diagnoses   Final diagnoses:  Intermittent right-sided chest pain     Discharge Instructions      Go to ER for further evaluation Do not  eat or drink anything prior to evaluation by provider   ED Prescriptions   None    PDMP not reviewed this encounter.   Peter Brands, NP 09/24/23 [phone]

## 2023-09-26 ENCOUNTER — Encounter: Payer: Self-pay | Admitting: Internal Medicine

## 2023-10-02 ENCOUNTER — Telehealth: Payer: Self-pay

## 2023-10-02 ENCOUNTER — Encounter: Payer: Self-pay | Admitting: Cardiovascular Disease

## 2023-10-02 ENCOUNTER — Ambulatory Visit: Attending: Cardiovascular Disease | Admitting: Cardiovascular Disease

## 2023-10-02 ENCOUNTER — Other Ambulatory Visit: Payer: Self-pay

## 2023-10-02 VITALS — BP 120/72 | HR 67 | Ht 64.5 in | Wt 181.2 lb

## 2023-10-02 DIAGNOSIS — R079 Chest pain, unspecified: Secondary | ICD-10-CM | POA: Diagnosis not present

## 2023-10-02 DIAGNOSIS — E782 Mixed hyperlipidemia: Secondary | ICD-10-CM | POA: Diagnosis not present

## 2023-10-02 DIAGNOSIS — R072 Precordial pain: Secondary | ICD-10-CM | POA: Diagnosis not present

## 2023-10-02 MED ORDER — METOPROLOL TARTRATE 50 MG PO TABS
ORAL_TABLET | ORAL | 0 refills | Status: DC
  Filled 2023-10-02: qty 1, 1d supply, fill #0

## 2023-10-02 MED ORDER — NITROGLYCERIN 0.4 MG SL SUBL
0.4000 mg | SUBLINGUAL_TABLET | SUBLINGUAL | 3 refills | Status: AC | PRN
  Filled 2023-10-02: qty 25, 8d supply, fill #0

## 2023-10-02 MED ORDER — PREDNISONE 50 MG PO TABS
ORAL_TABLET | ORAL | 0 refills | Status: DC
  Filled 2023-10-02: qty 3, 1d supply, fill #0

## 2023-10-02 MED ORDER — DIPHENHYDRAMINE HCL 25 MG PO TABS
ORAL_TABLET | ORAL | 0 refills | Status: DC
  Filled 2023-10-02: qty 2, 1d supply, fill #0

## 2023-10-02 NOTE — H&P (View-Only) (Signed)
 Cardiology Office Note   Date:  10/02/2023   [name], DOB [birth year], MRN [id number]  PCP:  Thersia Flax, MD  Cardiologist:   Antionette Kirks, MD   Chief Complaint  Patient presents with   New Patient (Initial Visit)    C/o Chest pain. Meds reviewed verbally with pt.      History of Present Illness: Pamela Lara is a 63 y.o. female who is self-referred for evaluation of chest pain.  She has no prior cardiac history.  She has known history of mixed hyperlipidemia not on treatment, prior history of GERD treated for H. pylori many years ago and has been off PPI for about 15 years and previous tobacco use. She recently drove long distance more than 15 hours for a weekend wedding.  When she returned, she started having right-sided chest pain described as tightness and pressure sensation radiating to her right neck and shoulder and associated with shortness of breath.  She was concerned about pulmonary embolism and thus she went to the ED for evaluation.  Troponin was normal.  CTA of the chest showed no evidence of pulmonary embolism.  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  Since her ED visit, she continued to have intermittent episodes of chest pain most recently was yesterday when she walked to work and had an intense episode of the same quality of chest pain but it was associated with shortness of breath and diaphoresis.  The episode lasted for about 10 to 15 minutes. Her EKG in the ED did show prominent T wave inversion in the anterior leads.  Her EKG today appears to be better.    Past Medical History:  Diagnosis Date   Arthritis    right shoulder   BPPV (benign paroxysmal positional vertigo)    Congenital bowed legs    s/p  surgical correction   Diverticulitis, colon    recurrent   Dysplastic nevus 09/09/2018   R mid post thigh   Dysplastic nevus 07/09/2019   R buttock,moderate atypia   Dysplastic nevus  07/09/2019   L buttock, moderate atypia   Dysplastic nevus 07/09/2019   R lat popliteal, moderate atypia   Ectopic pregnancy 01/29/2000   GERD (gastroesophageal reflux disease)    watch diet (as needed takes apple cider vinager)   History of colonic diverticulitis    hx multiple acute diverticulitis episodes treated medically   History of ectopic pregnancy    History of Helicobacter pylori infection 2013  approx.   Melanoma (HCC) 04/19/2018   R prox bicep (MMIS)   PONV (postoperative nausea and vomiting)    Seasonal affective disorder [facility])     Past Surgical History:  Procedure Laterality Date   CHOLECYSTECTOMY OPEN  1984   COLONOSCOPY WITH PROPOFOL  N/A 04/19/2017   Procedure: COLONOSCOPY WITH PROPOFOL ;  Surgeon: Marnee Sink, MD;  Location: [facility];  Service: Endoscopy;  Laterality: N/A;   COLONOSCOPY WITH PROPOFOL  N/A 02/16/2023   Procedure: COLONOSCOPY WITH PROPOFOL ;  Surgeon: Marnee Sink, MD;  Location: [facility];  Service: Endoscopy;  Laterality: N/A;   CYSTOSCOPY WITH STENT PLACEMENT Bilateral 08/15/2017   Procedure: CYSTOSCOPY WITH  FOLEY PLACEMENT WITH BILATERAL open ended catheter placement.  catheters removed at the end of the case.;  Surgeon: Samson Croak, MD;  Location: [facility];  Service: Urology;  Laterality: Bilateral;   EXCISIONAL SENTINAL LYMPH NODE BIOPSY, NECK N/A 2016  approx.   per pt  thyroid  area , benign   LAPAROSCOPIC SIGMOID COLECTOMY N/A 08/15/2017   Procedure: LAPAROSCOPIC SIGMOID COLECTOMY  ERAS PATHWAY;  Surgeon: Melvenia Stabs, MD;  Location: [facility];  Service: General;  Laterality: N/A;   LAPAROSCOPY FOR ECTOPIC PREGNANCY  2001   also  BILATERAL TUBAL LIGATION   OSTEOTOMY Bilateral 1980 and 1981   correction congenital bow legged   POLYPECTOMY N/A 02/16/2023   Procedure: POLYPECTOMY;  Surgeon: Marnee Sink, MD;  Location: [facility];  Service: Endoscopy;  Laterality: N/A;   TONSILLECTOMY       Current  Outpatient Medications  Medication Sig Dispense Refill   aspirin EC 81 MG tablet Take 81 mg by mouth daily. Swallow whole.     calcium carbonate (TUMS - DOSED IN MG ELEMENTAL CALCIUM) 500 MG chewable tablet Chew 1 tablet by mouth as needed for indigestion or heartburn.     Cholecalciferol (VITAMIN D3) 1000 UNITS CAPS Take 1,000 Units by mouth daily.     influenza vac split quadrivalent PF (FLUARIX QUADRIVALENT ) 0.5 ML injection Inject into the muscle. 0.5 mL 0   Korean Ginseng 1000 MG TABS Take 1,000 mg by mouth daily.      Multiple Vitamin (MULTIVITAMIN) tablet Take 1 tablet by mouth daily.     Omega-3 Fatty Acids (FISH OIL) 1000 MG CAPS Take 1,000 mg by mouth daily.      omeprazole  (PRILOSEC) 20 MG capsule Take 20 mg by mouth daily.     Psyllium (METAMUCIL FIBER PO) Take by mouth.     No current facility-administered medications for this visit.    Allergies:   Ivp dye [iodinated contrast media], Morphine and codeine, Other, and Wellbutrin  [bupropion ]    Social History:  The patient  reports that she quit smoking about 6 years ago. Her smoking use included cigarettes. She started smoking about 44 years ago. She has a 9.5 pack-year smoking history. She has never used smokeless tobacco. She reports current alcohol use. She reports that she does not use drugs.   Family History:  The patient's family history includes Arthritis in her father and mother; Cancer in her mother; Cancer (age of onset: 45) in her father; Hypertension in her father and mother.    ROS:  Please see the history of present illness.   Otherwise, review of systems are positive for none.   All other systems are reviewed and negative.    PHYSICAL EXAM: VS:  BP 120/72 (BP Location: Right Leg, Cuff Size: Normal)   Pulse 67   Ht 5' 4.5" (1.638 m)   Wt 181 lb 4 oz (82.2 kg)   LMP 10/03/2015 (Approximate)   SpO2 97%   BMI 30.63 kg/m  , BMI Body mass index is 30.63 kg/m. GEN: Well nourished, well developed, in no acute  distress  HEENT: normal  Neck: no JVD, carotid bruits, or masses Cardiac: RRR; no murmurs, rubs, or gallops,no edema  Respiratory:  clear to auscultation bilaterally, normal work of breathing GI: soft, nontender, nondistended, + BS MS: no deformity or atrophy  Skin: warm and dry, no rash Neuro:  Strength and sensation are intact Psych: euthymic mood, full affect   EKG:  EKG is  ordered today. The ekg ordered today demonstrates : Normal sinus rhythm Normal ECG       Recent Labs: 10/05/2022: ALT 43; TSH 1.18 09/24/2023: BUN 13; Creatinine, Ser 0.71; Hemoglobin 14.9; Platelets 195; Potassium 4.1; Sodium 139    Lipid Panel    Component Value Date/Time   CHOL  195 10/05/2022 0949   CHOL 153 04/10/2012 0817   TRIG 178.0 (H) 10/05/2022 0949   TRIG 295 (H) 04/10/2012 0817   HDL 54.80 10/05/2022 0949   HDL 43 04/10/2012 0817   CHOLHDL 4 10/05/2022 0949   VLDL 35.6 10/05/2022 0949   VLDL 59 (H) 04/10/2012 0817   LDLCALC 105 (H) 10/05/2022 0949   LDLCALC 51 04/10/2012 0817   LDLDIRECT 136.0 10/05/2022 0949      Wt Readings from Last 3 Encounters:  10/02/23 181 lb 4 oz (82.2 kg)  09/24/23 180 lb (81.6 kg)  02/16/23 178 lb (80.7 kg)           No data to display            ASSESSMENT AND PLAN:  1.  Precordial chest pain worrisome for angina with abnormal EKG showing T wave inversion in the anterior leads.  Continue aspirin daily.  I recommend evaluation with cardiac CTA.  Will pretreat for contrast allergy. I also provided her with sublingual nitroglycerin  to be used as needed. The symptoms are different from prior GERD symptoms.  2.  Hyperlipidemia: Most recent lipid profile in June 2024 showed an LDL of 136 and triglyceride of 178.  She takes fish oil.  If cardiac CTA shows evidence of significant atherosclerosis, we will start her on a statin.    Disposition:   FU in 2 months.  Signed,  Antionette Kirks, MD  10/02/2023 1:27 PM    [facility]

## 2023-10-02 NOTE — Patient Instructions (Signed)
 Medication Instructions:   NEW:  Nitroglycerin  0.4mg  SL  Place 1 tablet (0.4 mg total) under the tongue every 5 (five) minutes as needed for chest pain  If you take 3 and continue having Chest Pain call 911   *If you need a refill on your cardiac medications before your next appointment, please call your pharmacy*  Lab Work:  No labs ordered today   If you have labs (blood work) drawn today and your tests are completely normal, you will receive your results only by: MyChart Message (if you have MyChart) OR A paper copy in the mail If you have any lab test that is abnormal or we need to change your treatment, we will call you to review the results.  Testing/Procedures:   Your cardiac CT will be scheduled at one of the below locations:   [facility] [address] [place], [place] [address] [phone]  Please arrive 15 mins early for check-in and test prep.  There is spacious parking and easy access to the radiology department from the [facility] entrance. Please enter here and check-in with the desk attendant.   Please follow these instructions carefully (unless otherwise directed):  An IV will be required for this test and Nitroglycerin  will be given.  Hold all erectile dysfunction medications at least 3 days (72 hrs) prior to test. (Ie viagra, cialis, sildenafil, tadalafil, etc)   On the Night Before the Test: Be sure to Drink plenty of water . Do not consume any caffeinated/decaffeinated beverages or chocolate 12 hours prior to your test. Do not take any antihistamines 12 hours prior to your test. If the patient has contrast allergy: Patient will need a prescription for Prednisone  and very clear instructions (as follows): Prednisone  50 mg - take 13 hours prior to test Take another Prednisone  50 mg 7 hours prior to test Take another Prednisone  50 mg 1 hour prior to test Take Benadryl  50 mg 1 hour prior to test Patient must  complete all four doses of above prophylactic medications. Patient will need a ride after test due to Benadryl .  On the Day of the Test: Drink plenty of water  until 1 hour prior to the test. Do not eat any food 1 hour prior to test. You may take your regular medications prior to the test.  Take metoprolol (Lopressor) two hours prior to test. Patients who wear a continuous glucose monitor MUST remove the device prior to scanning. FEMALES- please wear underwire-free bra if available, avoid dresses & tight clothing  After the Test: Drink plenty of water . After receiving IV contrast, you may experience a mild flushed feeling. This is normal. On occasion, you may experience a mild rash up to 24 hours after the test. This is not dangerous. If this occurs, you can take Benadryl  25 mg, Zyrtec , Claritin, or Allegra and increase your fluid intake. (Patients taking Tikosyn should avoid Benadryl , and may take Zyrtec , Claritin, or Allegra) If you experience trouble breathing, this can be serious. If it is severe call 911 IMMEDIATELY. If it is mild, please call our office.  We will call to schedule your test 2-4 weeks out understanding that some insurance companies will need an authorization prior to the service being performed.   For more information and frequently asked questions, please visit our website : [email or web address]  For non-scheduling related questions, please contact the cardiac imaging nurse navigator should you have any questions/concerns: Cardiac Imaging Nurse Navigators Direct Office Dial: [phone]   For scheduling  needs, including cancellations and rescheduling, please call [place], [phone].    Follow-Up: At [facility], you and your health needs are our priority.  As part of our continuing mission to provide you with exceptional heart care, our providers are all part of one team.  This team includes your primary Cardiologist (physician) and  Advanced Practice Providers or APPs (Physician Assistants and Nurse Practitioners) who all work together to provide you with the care you need, when you need it.  Your next appointment:   2 month(s)  Provider:   Antionette Kirks, MD    We recommend signing up for the patient portal called "MyChart".  Sign up information is provided on this After Visit Summary.  MyChart is used to connect with patients for Virtual Visits (Telemedicine).  Patients are able to view lab/test results, encounter notes, upcoming appointments, etc.  Non-urgent messages can be sent to your provider as well.   To learn more about what you can do with MyChart, go to [email or web address].

## 2023-10-02 NOTE — Telephone Encounter (Signed)
 Patient is scheduled with me for ED/hospital follow up on 10/05/23. Can we have patient follow up with her PCP. It's important to see PCP who knows patient's full medical history.   Thank you,  Jacklin Mascot, MD

## 2023-10-02 NOTE — Progress Notes (Signed)
 Cardiology Office Note   Date:  10/02/2023   ID:  JEMA DEEGAN, DOB [birth year], MRN [id number]  PCP:  Thersia Flax, MD  Cardiologist:   Antionette Kirks, MD   Chief Complaint  Patient presents with   New Patient (Initial Visit)    C/o Chest pain. Meds reviewed verbally with pt.      History of Present Illness: Pamela Lara is a 63 y.o. female who is self-referred for evaluation of chest pain.  She has no prior cardiac history.  She has known history of mixed hyperlipidemia not on treatment, prior history of GERD treated for H. pylori many years ago and has been off PPI for about 15 years and previous tobacco use. She recently drove long distance more than 15 hours for a weekend wedding.  When she returned, she started having right-sided chest pain described as tightness and pressure sensation radiating to her right neck and shoulder and associated with shortness of breath.  She was concerned about pulmonary embolism and thus she went to the ED for evaluation.  Troponin was normal.  CTA of the chest showed no evidence of pulmonary embolism.  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  Since her ED visit, she continued to have intermittent episodes of chest pain most recently was yesterday when she walked to work and had an intense episode of the same quality of chest pain but it was associated with shortness of breath and diaphoresis.  The episode lasted for about 10 to 15 minutes. Her EKG in the ED did show prominent T wave inversion in the anterior leads.  Her EKG today appears to be better.    Past Medical History:  Diagnosis Date   Arthritis    right shoulder   BPPV (benign paroxysmal positional vertigo)    Congenital bowed legs    s/p  surgical correction   Diverticulitis, colon    recurrent   Dysplastic nevus 09/09/2018   R mid post thigh   Dysplastic nevus 07/09/2019   R buttock,moderate atypia   Dysplastic nevus  07/09/2019   L buttock, moderate atypia   Dysplastic nevus 07/09/2019   R lat popliteal, moderate atypia   Ectopic pregnancy 01/29/2000   GERD (gastroesophageal reflux disease)    watch diet (as needed takes apple cider vinager)   History of colonic diverticulitis    hx multiple acute diverticulitis episodes treated medically   History of ectopic pregnancy    History of Helicobacter pylori infection 2013  approx.   Melanoma (HCC) 04/19/2018   R prox bicep (MMIS)   PONV (postoperative nausea and vomiting)    Seasonal affective disorder [facility])     Past Surgical History:  Procedure Laterality Date   CHOLECYSTECTOMY OPEN  1984   COLONOSCOPY WITH PROPOFOL  N/A 04/19/2017   Procedure: COLONOSCOPY WITH PROPOFOL ;  Surgeon: Marnee Sink, MD;  Location: [facility];  Service: Endoscopy;  Laterality: N/A;   COLONOSCOPY WITH PROPOFOL  N/A 02/16/2023   Procedure: COLONOSCOPY WITH PROPOFOL ;  Surgeon: Marnee Sink, MD;  Location: [facility];  Service: Endoscopy;  Laterality: N/A;   CYSTOSCOPY WITH STENT PLACEMENT Bilateral 08/15/2017   Procedure: CYSTOSCOPY WITH  FOLEY PLACEMENT WITH BILATERAL open ended catheter placement.  catheters removed at the end of the case.;  Surgeon: Samson Croak, MD;  Location: [facility];  Service: Urology;  Laterality: Bilateral;   EXCISIONAL SENTINAL LYMPH NODE BIOPSY, NECK N/A 2016  approx.   per pt  thyroid  area , benign   LAPAROSCOPIC SIGMOID COLECTOMY N/A 08/15/2017   Procedure: LAPAROSCOPIC SIGMOID COLECTOMY  ERAS PATHWAY;  Surgeon: Melvenia Stabs, MD;  Location: [facility];  Service: General;  Laterality: N/A;   LAPAROSCOPY FOR ECTOPIC PREGNANCY  2001   also  BILATERAL TUBAL LIGATION   OSTEOTOMY Bilateral 1980 and 1981   correction congenital bow legged   POLYPECTOMY N/A 02/16/2023   Procedure: POLYPECTOMY;  Surgeon: Marnee Sink, MD;  Location: [facility];  Service: Endoscopy;  Laterality: N/A;   TONSILLECTOMY       Current  Outpatient Medications  Medication Sig Dispense Refill   aspirin EC 81 MG tablet Take 81 mg by mouth daily. Swallow whole.     calcium carbonate (TUMS - DOSED IN MG ELEMENTAL CALCIUM) 500 MG chewable tablet Chew 1 tablet by mouth as needed for indigestion or heartburn.     Cholecalciferol (VITAMIN D3) 1000 UNITS CAPS Take 1,000 Units by mouth daily.     influenza vac split quadrivalent PF (FLUARIX QUADRIVALENT ) 0.5 ML injection Inject into the muscle. 0.5 mL 0   [place] Ginseng 1000 MG TABS Take 1,000 mg by mouth daily.      Multiple Vitamin (MULTIVITAMIN) tablet Take 1 tablet by mouth daily.     Omega-3 Fatty Acids (FISH OIL) 1000 MG CAPS Take 1,000 mg by mouth daily.      omeprazole  (PRILOSEC) 20 MG capsule Take 20 mg by mouth daily.     Psyllium (METAMUCIL FIBER PO) Take by mouth.     No current facility-administered medications for this visit.    Allergies:   Ivp dye [iodinated contrast media], Morphine and codeine, Other, and Wellbutrin  [bupropion ]    Social History:  The patient  reports that she quit smoking about 6 years ago. Her smoking use included cigarettes. She started smoking about 44 years ago. She has a 9.5 pack-year smoking history. She has never used smokeless tobacco. She reports current alcohol use. She reports that she does not use drugs.   Family History:  The patient's family history includes Arthritis in her father and mother; Cancer in her mother; Cancer (age of onset: 45) in her father; Hypertension in her father and mother.    ROS:  Please see the history of present illness.   Otherwise, review of systems are positive for none.   All other systems are reviewed and negative.    PHYSICAL EXAM: VS:  BP 120/72 (BP Location: Right Leg, Cuff Size: Normal)   Pulse 67   Ht 5' 4.5" (1.638 m)   Wt 181 lb 4 oz (82.2 kg)   LMP 10/03/2015 (Approximate)   SpO2 97%   BMI 30.63 kg/m  , BMI Body mass index is 30.63 kg/m. GEN: Well nourished, well developed, in no acute  distress  HEENT: normal  Neck: no JVD, carotid bruits, or masses Cardiac: RRR; no murmurs, rubs, or gallops,no edema  Respiratory:  clear to auscultation bilaterally, normal work of breathing GI: soft, nontender, nondistended, + BS MS: no deformity or atrophy  Skin: warm and dry, no rash Neuro:  Strength and sensation are intact Psych: euthymic mood, full affect   EKG:  EKG is  ordered today. The ekg ordered today demonstrates : Normal sinus rhythm Normal ECG       Recent Labs: 10/05/2022: ALT 43; TSH 1.18 09/24/2023: BUN 13; Creatinine, Ser 0.71; Hemoglobin 14.9; Platelets 195; Potassium 4.1; Sodium 139    Lipid Panel    Component Value Date/Time   CHOL  195 10/05/2022 0949   CHOL 153 04/10/2012 0817   TRIG 178.0 (H) 10/05/2022 0949   TRIG 295 (H) 04/10/2012 0817   HDL 54.80 10/05/2022 0949   HDL 43 04/10/2012 0817   CHOLHDL 4 10/05/2022 0949   VLDL 35.6 10/05/2022 0949   VLDL 59 (H) 04/10/2012 0817   LDLCALC 105 (H) 10/05/2022 0949   LDLCALC 51 04/10/2012 0817   LDLDIRECT 136.0 10/05/2022 0949      Wt Readings from Last 3 Encounters:  10/02/23 181 lb 4 oz (82.2 kg)  09/24/23 180 lb (81.6 kg)  02/16/23 178 lb (80.7 kg)           No data to display            ASSESSMENT AND PLAN:  1.  Precordial chest pain worrisome for angina with abnormal EKG showing T wave inversion in the anterior leads.  Continue aspirin daily.  I recommend evaluation with cardiac CTA.  Will pretreat for contrast allergy. I also provided her with sublingual nitroglycerin  to be used as needed. The symptoms are different from prior GERD symptoms.  2.  Hyperlipidemia: Most recent lipid profile in June 2024 showed an LDL of 136 and triglyceride of 178.  She takes fish oil.  If cardiac CTA shows evidence of significant atherosclerosis, we will start her on a statin.    Disposition:   FU in 2 months.  Signed,  Antionette Kirks, MD  10/02/2023 1:27 PM    [facility]

## 2023-10-03 ENCOUNTER — Telehealth: Payer: Self-pay | Admitting: Internal Medicine

## 2023-10-03 NOTE — Telephone Encounter (Signed)
 Are you okay with keeping the appt on 10/11/2023 as a physical. Pt would like to wait until see has the testing done with cardiology before scheduling the ED follow up. Or if you think pt should move appt out until the testing is done is will do that.

## 2023-10-03 NOTE — Telephone Encounter (Signed)
 Spoke with pt and she preferred to move her appt out a month. Appt has been rescheduled.

## 2023-10-03 NOTE — Telephone Encounter (Signed)
 Copied from CRM [id number]. Topic: Appointments - Scheduling Inquiry for Clinic >> Oct 03, 2023 12:23 PM Baldo Levan wrote: Reason for CRM: Patient stated she received a call to switch her annual physical to a ED follow up. Patient stated she is still working with a cardiologist for why she is in the ED. Patient is asking to switch this back to her annual physical or push this follow up out a month for when her cardiology testing comes back. Patient would like to know what she should do.

## 2023-10-05 ENCOUNTER — Inpatient Hospital Stay

## 2023-10-08 ENCOUNTER — Ambulatory Visit
Admission: RE | Admit: 2023-10-08 | Discharge: 2023-10-08 | Disposition: A | Discharge status: Home / Self Care | Source: Ambulatory Visit | Attending: Cardiovascular Disease | Admitting: Cardiovascular Disease

## 2023-10-08 ENCOUNTER — Ambulatory Visit: Payer: Self-pay | Admitting: Cardiovascular Disease

## 2023-10-08 ENCOUNTER — Other Ambulatory Visit: Payer: Self-pay | Admitting: Cardiovascular Disease

## 2023-10-08 DIAGNOSIS — I2510 Atherosclerotic heart disease of native coronary artery without angina pectoris: Secondary | ICD-10-CM | POA: Diagnosis not present

## 2023-10-08 DIAGNOSIS — I25118 Atherosclerotic heart disease of native coronary artery with other forms of angina pectoris: Secondary | ICD-10-CM | POA: Insufficient documentation

## 2023-10-08 DIAGNOSIS — R072 Precordial pain: Secondary | ICD-10-CM | POA: Insufficient documentation

## 2023-10-08 DIAGNOSIS — I2081 Angina pectoris with coronary microvascular dysfunction: Secondary | ICD-10-CM | POA: Diagnosis present

## 2023-10-08 DIAGNOSIS — R931 Abnormal findings on diagnostic imaging of heart and coronary circulation: Secondary | ICD-10-CM | POA: Insufficient documentation

## 2023-10-08 MED ORDER — NITROGLYCERIN 0.4 MG SL SUBL
SUBLINGUAL_TABLET | SUBLINGUAL | Status: AC
  Filled 2023-10-08: qty 2

## 2023-10-08 MED ORDER — IOHEXOL 350 MG/ML SOLN
80.0000 mL | Freq: Once | INTRAVENOUS | Status: AC | PRN
  Administered 2023-10-08: 75 mL via INTRAVENOUS

## 2023-10-08 MED ORDER — DILTIAZEM HCL 25 MG/5ML IV SOLN
10.0000 mg | INTRAVENOUS | Status: DC | PRN
  Filled 2023-10-08: qty 5

## 2023-10-08 MED ORDER — NITROGLYCERIN 0.4 MG SL SUBL
0.8000 mg | SUBLINGUAL_TABLET | Freq: Once | SUBLINGUAL | Status: AC
  Administered 2023-10-08: 0.8 mg via SUBLINGUAL
  Filled 2023-10-08: qty 25

## 2023-10-08 MED ORDER — METOPROLOL TARTRATE 5 MG/5ML IV SOLN
10.0000 mg | Freq: Once | INTRAVENOUS | Status: DC | PRN
  Filled 2023-10-08: qty 10

## 2023-10-09 ENCOUNTER — Other Ambulatory Visit: Payer: Self-pay

## 2023-10-09 DIAGNOSIS — R931 Abnormal findings on diagnostic imaging of heart and coronary circulation: Secondary | ICD-10-CM | POA: Diagnosis not present

## 2023-10-09 DIAGNOSIS — I2510 Atherosclerotic heart disease of native coronary artery without angina pectoris: Secondary | ICD-10-CM | POA: Diagnosis not present

## 2023-10-09 MED ORDER — PREDNISONE 50 MG PO TABS
ORAL_TABLET | ORAL | 0 refills | Status: DC
  Filled 2023-10-09: qty 3, 1d supply, fill #0

## 2023-10-09 MED ORDER — DIPHENHYDRAMINE HCL 25 MG PO TABS
50.0000 mg | ORAL_TABLET | Freq: Once | ORAL | 0 refills | Status: DC
  Filled 2023-10-09: qty 2, 1d supply, fill #0

## 2023-10-09 NOTE — Telephone Encounter (Signed)
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  Schedule urgent left heart catheterization and possible PCI at [facility] on Wednesday, June 11.  The procedure needs to be done there in case I need to use a drug-coated balloon on the sidebranch.

## 2023-10-09 NOTE — Telephone Encounter (Signed)
 Patient made aware of results and verbalized understanding. Instructions will be sent to MyChart as well.    Pamela Lara  10/09/2023  You are scheduled for a Cardiac Catheterization on Wednesday, June 11 with Dr. Antionette Kirks.  1. Please arrive at the [facility] (Main Entrance A) at [facility]: [address] [place], [place] [address] at 10:30 AM (This time is 2 hour(s) before your procedure to ensure your preparation).   Free valet parking service is available. You will check in at ADMITTING. The support person will be asked to wait in the waiting room.  It is OK to have someone drop you off and come back when you are ready to be discharged.    Special note: Every effort is made to have your procedure done on time. Please understand that emergencies sometimes delay scheduled procedures.  2. Diet: Do not eat solid foods after midnight.  The patient may have clear liquids until 5am upon the day of the procedure.  3. Labs: Blood drawn on 09/24/23.  4. Medication instructions in preparation for your procedure:   Contrast Allergy: Yes, Please take Prednisone  50mg  by mouth at: Thirteen hours prior to cath  Seven hours prior to cath  And prior to leaving home please take last dose of Prednisone  50mg  and Benadryl  50mg  by mouth.   On the morning of your procedure, take your Aspirin 81 mg and any morning medicines NOT listed above.  You may use sips of water .  5. Plan to go home the same day, you will only stay overnight if medically necessary. 6. Bring a current list of your medications and current insurance cards. 7. You MUST have a responsible person to drive you home. 8. Someone MUST be with you the first 24 hours after you arrive home or your discharge will be delayed. 9. Please wear clothes that are easy to get on and off and wear slip-on shoes.  Thank you for allowing us  to care for you!   -- [facility]

## 2023-10-10 ENCOUNTER — Encounter (HOSPITAL_COMMUNITY)
Admission: RE | Disposition: A | Discharge status: Home / Self Care | Payer: Self-pay | Source: Ambulatory Visit | Attending: Cardiovascular Disease

## 2023-10-10 ENCOUNTER — Other Ambulatory Visit: Payer: Self-pay

## 2023-10-10 ENCOUNTER — Encounter (HOSPITAL_COMMUNITY): Payer: Self-pay | Admitting: Cardiovascular Disease

## 2023-10-10 ENCOUNTER — Observation Stay (HOSPITAL_COMMUNITY)
Admission: RE | Admit: 2023-10-10 | Discharge: 2023-10-11 | Disposition: A | Discharge status: Home / Self Care | Source: Ambulatory Visit | Attending: Cardiovascular Disease | Admitting: Cardiovascular Disease

## 2023-10-10 DIAGNOSIS — Z87891 Personal history of nicotine dependence: Secondary | ICD-10-CM | POA: Diagnosis not present

## 2023-10-10 DIAGNOSIS — Z7982 Long term (current) use of aspirin: Secondary | ICD-10-CM | POA: Insufficient documentation

## 2023-10-10 DIAGNOSIS — E785 Hyperlipidemia, unspecified: Secondary | ICD-10-CM | POA: Diagnosis not present

## 2023-10-10 DIAGNOSIS — Z955 Presence of coronary angioplasty implant and graft: Principal | ICD-10-CM

## 2023-10-10 DIAGNOSIS — I25110 Atherosclerotic heart disease of native coronary artery with unstable angina pectoris: Secondary | ICD-10-CM | POA: Diagnosis not present

## 2023-10-10 DIAGNOSIS — Z79899 Other long term (current) drug therapy: Secondary | ICD-10-CM | POA: Diagnosis not present

## 2023-10-10 DIAGNOSIS — I200 Unstable angina: Secondary | ICD-10-CM | POA: Diagnosis present

## 2023-10-10 DIAGNOSIS — I2510 Atherosclerotic heart disease of native coronary artery without angina pectoris: Secondary | ICD-10-CM | POA: Diagnosis present

## 2023-10-10 HISTORY — PX: CORONARY STENT INTERVENTION: CATH118234

## 2023-10-10 HISTORY — PX: LEFT HEART CATH AND CORONARY ANGIOGRAPHY: CATH118249

## 2023-10-10 LAB — POCT ACTIVATED CLOTTING TIME
Activated Clotting Time: 262 s
Activated Clotting Time: 417 s

## 2023-10-10 MED ORDER — SODIUM CHLORIDE 0.9 % WEIGHT BASED INFUSION
1.0000 mL/kg/h | INTRAVENOUS | Status: AC
  Administered 2023-10-10: 1 mL/kg/h via INTRAVENOUS

## 2023-10-10 MED ORDER — ATORVASTATIN CALCIUM 80 MG PO TABS
80.0000 mg | ORAL_TABLET | Freq: Every day | ORAL | Status: DC
  Administered 2023-10-10 – 2023-10-11 (×2): 80 mg via ORAL
  Filled 2023-10-10 (×2): qty 1

## 2023-10-10 MED ORDER — HEPARIN (PORCINE) IN NACL 2000-0.9 UNIT/L-% IV SOLN
INTRAVENOUS | Status: DC | PRN
  Administered 2023-10-10: 1000 mL

## 2023-10-10 MED ORDER — SODIUM CHLORIDE 0.9 % WEIGHT BASED INFUSION: 3.0000 mL/kg/h | INTRAVENOUS | Status: DC

## 2023-10-10 MED ORDER — NITROGLYCERIN 1 MG/10 ML FOR IR/CATH LAB
INTRA_ARTERIAL | Status: AC
  Filled 2023-10-10: qty 10

## 2023-10-10 MED ORDER — LIDOCAINE HCL (PF) 1 % IJ SOLN
INTRAMUSCULAR | Status: AC
  Filled 2023-10-10: qty 30

## 2023-10-10 MED ORDER — NITROGLYCERIN 1 MG/10 ML FOR IR/CATH LAB
INTRA_ARTERIAL | Status: DC | PRN
  Administered 2023-10-10: 100 ug via INTRACORONARY
  Administered 2023-10-10: 200 ug via INTRACORONARY

## 2023-10-10 MED ORDER — HEPARIN SODIUM (PORCINE) 1000 UNIT/ML IJ SOLN
INTRAMUSCULAR | Status: DC | PRN
  Administered 2023-10-10: 4000 [IU] via INTRAVENOUS
  Administered 2023-10-10: 2000 [IU] via INTRAVENOUS
  Administered 2023-10-10: 5000 [IU] via INTRAVENOUS

## 2023-10-10 MED ORDER — NITROGLYCERIN 0.4 MG SL SUBL
0.4000 mg | SUBLINGUAL_TABLET | SUBLINGUAL | Status: DC | PRN
  Administered 2023-10-11: 0.4 mg via SUBLINGUAL
  Filled 2023-10-10: qty 1

## 2023-10-10 MED ORDER — PRASUGREL HCL 10 MG PO TABS
10.0000 mg | ORAL_TABLET | Freq: Every day | ORAL | Status: DC
  Administered 2023-10-11: 10 mg via ORAL
  Filled 2023-10-10: qty 1

## 2023-10-10 MED ORDER — PANTOPRAZOLE SODIUM 40 MG PO TBEC
40.0000 mg | DELAYED_RELEASE_TABLET | Freq: Every day | ORAL | Status: DC
  Administered 2023-10-10 – 2023-10-11 (×2): 40 mg via ORAL
  Filled 2023-10-10 (×2): qty 1

## 2023-10-10 MED ORDER — ASPIRIN 81 MG PO CHEW
81.0000 mg | CHEWABLE_TABLET | ORAL | Status: DC
Start: 2023-10-10 — End: 2023-10-10

## 2023-10-10 MED ORDER — MIDAZOLAM HCL 2 MG/2ML IJ SOLN
INTRAMUSCULAR | Status: AC
Start: 2023-10-10 — End: 2023-10-10
  Filled 2023-10-10: qty 2

## 2023-10-10 MED ORDER — VITAMIN D 25 MCG (1000 UNIT) PO TABS
1000.0000 [IU] | ORAL_TABLET | Freq: Every day | ORAL | Status: DC
  Administered 2023-10-11: 1000 [IU] via ORAL
  Filled 2023-10-10: qty 1

## 2023-10-10 MED ORDER — VERAPAMIL HCL 2.5 MG/ML IV SOLN
INTRAVENOUS | Status: DC | PRN
  Administered 2023-10-10: 10 mL via INTRA_ARTERIAL

## 2023-10-10 MED ORDER — HEPARIN SODIUM (PORCINE) 1000 UNIT/ML IJ SOLN
INTRAMUSCULAR | Status: AC
  Filled 2023-10-10: qty 10

## 2023-10-10 MED ORDER — CALCIUM CARBONATE ANTACID 500 MG PO CHEW: 1.0000 | CHEWABLE_TABLET | ORAL | Status: DC | PRN

## 2023-10-10 MED ORDER — METOPROLOL SUCCINATE ER 25 MG PO TB24
25.0000 mg | ORAL_TABLET | Freq: Every day | ORAL | Status: DC
  Administered 2023-10-10 – 2023-10-11 (×2): 25 mg via ORAL
  Filled 2023-10-10 (×2): qty 1

## 2023-10-10 MED ORDER — VERAPAMIL HCL 2.5 MG/ML IV SOLN
INTRAVENOUS | Status: AC
  Filled 2023-10-10: qty 2

## 2023-10-10 MED ORDER — MIDAZOLAM HCL 2 MG/2ML IJ SOLN
INTRAMUSCULAR | Status: DC | PRN
  Administered 2023-10-10: 2 mg via INTRAVENOUS

## 2023-10-10 MED ORDER — ACETAMINOPHEN 325 MG PO TABS
650.0000 mg | ORAL_TABLET | ORAL | Status: DC | PRN
  Administered 2023-10-10: 650 mg via ORAL
  Filled 2023-10-10: qty 2

## 2023-10-10 MED ORDER — PRASUGREL HCL 10 MG PO TABS
ORAL_TABLET | ORAL | Status: AC
  Filled 2023-10-10: qty 6

## 2023-10-10 MED ORDER — LIDOCAINE HCL (PF) 1 % IJ SOLN
INTRAMUSCULAR | Status: DC | PRN
  Administered 2023-10-10: 5 mL

## 2023-10-10 MED ORDER — SODIUM CHLORIDE 0.9% FLUSH: 3.0000 mL | INTRAVENOUS | Status: DC | PRN

## 2023-10-10 MED ORDER — SODIUM CHLORIDE 0.9 % IV SOLN: 250.0000 mL | INTRAVENOUS | Status: DC | PRN

## 2023-10-10 MED ORDER — IODIXANOL 320 MG/ML IV SOLN
INTRAVENOUS | Status: DC | PRN
  Administered 2023-10-10: 150 mL

## 2023-10-10 MED ORDER — SODIUM CHLORIDE 0.9% FLUSH
3.0000 mL | Freq: Two times a day (BID) | INTRAVENOUS | Status: DC
Start: 2023-10-11 — End: 2023-10-11
  Administered 2023-10-11: 3 mL via INTRAVENOUS

## 2023-10-10 MED ORDER — SODIUM CHLORIDE 0.9 % WEIGHT BASED INFUSION: 1.0000 mL/kg/h | INTRAVENOUS | Status: DC

## 2023-10-10 MED ORDER — ONDANSETRON HCL 4 MG/2ML IJ SOLN: 4.0000 mg | Freq: Four times a day (QID) | INTRAMUSCULAR | Status: DC | PRN

## 2023-10-10 MED ORDER — ASPIRIN 81 MG PO TBEC
81.0000 mg | DELAYED_RELEASE_TABLET | Freq: Every day | ORAL | Status: DC
  Administered 2023-10-11: 81 mg via ORAL
  Filled 2023-10-10: qty 1

## 2023-10-10 MED ORDER — PRASUGREL HCL 10 MG PO TABS
ORAL_TABLET | ORAL | Status: DC | PRN
  Administered 2023-10-10: 60 mg via ORAL

## 2023-10-10 NOTE — Plan of Care (Signed)
  Problem: Cardiovascular: Goal: Ability to achieve and maintain adequate cardiovascular perfusion will improve Outcome: Progressing   Problem: Education: Goal: Knowledge of General Education information will improve Description: Including pain rating scale, medication(s)/side effects and non-pharmacologic comfort measures Outcome: Progressing   Problem: Clinical Measurements: Goal: Ability to maintain clinical measurements within normal limits will improve Outcome: Progressing   Problem: Activity: Goal: Risk for activity intolerance will decrease Outcome: Progressing

## 2023-10-10 NOTE — Interval H&P Note (Signed)
 History and Physical Interval Note:  10/10/2023 1:51 PM  Pamela Lara  has presented today for surgery, with the diagnosis of Abnormal CT.  The various methods of treatment have been discussed with the patient and family. After consideration of risks, benefits and other options for treatment, the patient has consented to  Procedure(s): LEFT HEART CATH AND CORONARY ANGIOGRAPHY (N/A) as a surgical intervention.  The patient's history has been reviewed, patient examined, no change in status, stable for surgery.  I have reviewed the patient's chart and labs.  Questions were answered to the patient's satisfaction.     [name]

## 2023-10-11 ENCOUNTER — Other Ambulatory Visit (HOSPITAL_COMMUNITY): Payer: Self-pay

## 2023-10-11 ENCOUNTER — Encounter: Payer: Commercial Managed Care - PPO | Admitting: Internal Medicine

## 2023-10-11 DIAGNOSIS — Z87891 Personal history of nicotine dependence: Secondary | ICD-10-CM | POA: Diagnosis not present

## 2023-10-11 DIAGNOSIS — I200 Unstable angina: Secondary | ICD-10-CM | POA: Diagnosis not present

## 2023-10-11 DIAGNOSIS — Z79899 Other long term (current) drug therapy: Secondary | ICD-10-CM | POA: Diagnosis not present

## 2023-10-11 DIAGNOSIS — Z7982 Long term (current) use of aspirin: Secondary | ICD-10-CM | POA: Diagnosis not present

## 2023-10-11 DIAGNOSIS — E785 Hyperlipidemia, unspecified: Secondary | ICD-10-CM | POA: Diagnosis not present

## 2023-10-11 DIAGNOSIS — I25110 Atherosclerotic heart disease of native coronary artery with unstable angina pectoris: Secondary | ICD-10-CM | POA: Diagnosis not present

## 2023-10-11 LAB — CBC
HCT: 39.4 % (ref 36.0–46.0)
Hemoglobin: 14 g/dL (ref 12.0–15.0)
MCH: 31 pg (ref 26.0–34.0)
MCHC: 35.5 g/dL (ref 30.0–36.0)
MCV: 87.2 fL (ref 80.0–100.0)
Platelets: 204 10*3/uL (ref 150–400)
RBC: 4.52 MIL/uL (ref 3.87–5.11)
RDW: 12.6 % (ref 11.5–15.5)
WBC: 13.9 10*3/uL — ABNORMAL HIGH (ref 4.0–10.5)
nRBC: 0 % (ref 0.0–0.2)

## 2023-10-11 LAB — BASIC METABOLIC PANEL WITH GFR
Anion gap: 10 (ref 5–15)
BUN: 12 mg/dL (ref 8–23)
CO2: 23 mmol/L (ref 22–32)
Calcium: 8.9 mg/dL (ref 8.9–10.3)
Chloride: 106 mmol/L (ref 98–111)
Creatinine, Ser: 0.77 mg/dL (ref 0.44–1.00)
GFR, Estimated: >60 mL/min (ref >60)
Glucose, Bld: 114 mg/dL — ABNORMAL HIGH (ref 70–99)
Potassium: 3.4 mmol/L — ABNORMAL LOW (ref 3.5–5.1)
Sodium: 139 mmol/L (ref 135–145)

## 2023-10-11 MED ORDER — METOPROLOL SUCCINATE ER 25 MG PO TB24
25.0000 mg | ORAL_TABLET | Freq: Every day | ORAL | 3 refills | Status: DC
  Filled 2023-10-11: qty 90, 90d supply, fill #0

## 2023-10-11 MED ORDER — ATORVASTATIN CALCIUM 80 MG PO TABS
80.0000 mg | ORAL_TABLET | Freq: Every day | ORAL | 3 refills | Status: DC
  Filled 2023-10-11: qty 90, 90d supply, fill #0
  Filled 2024-01-01: qty 90, 90d supply, fill #1

## 2023-10-11 MED ORDER — PRASUGREL HCL 10 MG PO TABS
10.0000 mg | ORAL_TABLET | Freq: Every day | ORAL | 3 refills | Status: AC
Start: 2023-10-11 — End: ?
  Filled 2023-10-11: qty 90, 90d supply, fill #0
  Filled 2024-01-01: qty 90, 90d supply, fill #1
  Filled 2024-03-20: qty 90, 90d supply, fill #2

## 2023-10-11 NOTE — Progress Notes (Signed)
 At around 0141H, patient reports chest discomfort 3/10, radiating to right neck and right shoulders. Patient states it is the same pain that she had before the heart cath. Vital signs as follows: BP 114/68, HR 72, RR 18, SPO2 97% on RA. EKG obtained. NTG SL x1 given at 0148H. Notified Dr. Vira Grieves, cardiologist on call.  At 0153H, patient reports chest pain resolved. BP 111/75  Rafael Bun, RN

## 2023-10-11 NOTE — Plan of Care (Signed)
  Problem: Activity: Goal: Ability to return to baseline activity level will improve Outcome: Progressing   Problem: Cardiovascular: Goal: Ability to achieve and maintain adequate cardiovascular perfusion will improve Outcome: Progressing Goal: Vascular access site(s) Level 0-1 will be maintained Outcome: Progressing   Problem: Clinical Measurements: Goal: Respiratory complications will improve Outcome: Progressing Goal: Cardiovascular complication will be avoided Outcome: Progressing   Problem: Safety: Goal: Ability to remain free from injury will improve Outcome: Progressing

## 2023-10-11 NOTE — Progress Notes (Signed)
 Pt walked the hallway earlier to the nurses station w/o unusual s/s. Pt received education on nutrition, exercise, restrictions, and CRP2. Pt will do CR at [facility].   Barkley Li 10/11/2023 10:35 AM

## 2023-10-11 NOTE — TOC CM/SW Note (Signed)
 Transition of Care [facility]) - Inpatient Brief Assessment   Patient Details  Name: ZAMARAH ULLMER MRN: [id number] Date of Birth: Aug 21, 1960  Transition of Care [facility]) CM/SW Contact:    Cosimo Diones, RN Phone Number: 10/11/2023, 10:33 AM   Clinical Narrative: Patient presented for chest pain-post LHC. Benefits check submitted for [employer]- cost is $15.00. No home needs identified at this time.   Transition of Care Asessment: Insurance and Status: Insurance coverage has been reviewed Patient has primary care physician: Yes Prior/Current Home Services: No current home services Social Drivers of Health Review: SDOH reviewed no interventions necessary Readmission risk has been reviewed: Yes Transition of care needs: no transition of care needs at this time

## 2023-10-11 NOTE — Discharge Summary (Addendum)
 Discharge Summary   Patient ID: Pamela Lara MRN: [id number]; DOB: 10/11/1960  Admit date: 10/10/2023 Discharge date: 10/11/2023  PCP:  Thersia Flax, MD   [facility] Providers Cardiologist:  Antionette Kirks, MD       Discharge Diagnoses  Principal Problem:   Unstable angina [facility])   Diagnostic Studies/Procedures  Cardiac Studies & Procedures   ______________________________________________________________________________________________ CARDIAC CATHETERIZATION  CARDIAC CATHETERIZATION 10/10/2023  Conclusion   Prox LAD to Mid LAD lesion is 99% stenosed with 99% stenosed side branch in 1st Diag.   Mid LAD lesion is 40% stenosed.   A stent was successfully placed.   A drug-eluting stent was successfully placed using a STENT SYNERGY XD 3.50X20.   in the main branch and side branch.   in the main branch and side branch.   Angioplasty was performed in the main branch and side branch. .   Angioplasty was performed in the main branch and side branch. .   Post intervention, there is a 0% residual stenosis.   Post intervention, the side branch was reduced to 20% residual stenosis.   The left ventricular systolic function is normal.   LV end diastolic pressure is mildly elevated.   The left ventricular ejection fraction is 55-65% by visual estimate.   In the absence of any other complications or medical issues, we expect the patient to be ready for discharge from an interventional cardiology perspective on 10/11/2023.   Recommend uninterrupted dual antiplatelet therapy with Aspirin 81mg  daily and Prasugrel 10mg  daily for a minimum of 12 months (ACS-Class I recommendation).  1.  Severe one-vessel coronary artery disease with 99% stenosis in the proximal LAD involving a large first diagonal. 2.  Normal LV systolic function mildly elevated left ventricular end-diastolic pressure. 3.  Successful provisional stenting of the LAD with drug-coated balloon angioplasty to the  ostium of the diagonal.  Recommendations: Due to complex intervention, will observe overnight.  Findings Coronary Findings Diagnostic  Dominance: Right  Left Main Vessel is angiographically normal.  Left Anterior Descending Prox LAD to Mid LAD lesion is 99% stenosed with 99% stenosed side branch in 1st Diag. Vessel is the culprit lesion. The lesion is type C and located at the major branch. The lesion was not previously treated . Mid LAD lesion is 40% stenosed.  First Diagonal Branch Vessel is large in size.  Ramus Intermedius Vessel is angiographically normal.  Left Circumflex Vessel is angiographically normal.  Right Coronary Artery Vessel is angiographically normal.  Right Posterior Descending Artery Vessel is angiographically normal.  Right Posterior Atrioventricular Artery Vessel is angiographically normal.  Intervention  Prox LAD to Mid LAD lesion with side branch in 1st Diag Provisional - Main and Side Branches Provisional stenting was successfully performed in the main branch from the Prox LAD to the Mid LAD and in the side branch at the 1st Diag. An XB 3.5 guide catheter was used.  A run-through wire was used advanced to the diagonal. An Izani wire was advanced to the LAD. Pre-stent angioplasty was performed in the main branch and side branch. A BALLOON EMERGE MR 2.5X15 semi-compliant balloon was used in the main branch. Maximum pressure: 8 atm. Inflation time: 20 sec. A BALLOON EMERGE MR 2.5X15 semi-compliant balloon was used in the side branch. Maximum pressure: 8 atm. Inflation time: 20 sec. A STENT SYNERGY XD 3.50X20 drug-eluting stent was successfully placed. Maximum pressure: 12 atm. Inflation time: 20 sec. Post-stent angioplasty was performed in the main branch and side branch. A  BALLOON [place] EMERGE MR 3.75X15 non-compliant balloon was used in the main branch. Maximum pressure: 12 atm. Inflation time: 20 sec. A BALL DC AGENT 3.50X15 drug-coated balloon was used in  the side branch. Maximum pressure: 6 atm. Inflation time: 50 sec. Post-Intervention Lesion Assessment The intervention was successful. Pre-interventional TIMI flow is 3. Post-intervention TIMI flow is 3. No complications occurred at this lesion. There is a 0% residual stenosis in the main branch post intervention. There is a 20% residual stenosis in the side branch post intervention.  Diagnostic Dominance: Right  Intervention          CT SCANS  CT CORONARY MORPH W/CTA COR W/SCORE 10/08/2023  Narrative CLINICAL DATA:  Chest pain  EXAM: Cardiac/Coronary  CTA  TECHNIQUE: The patient was scanned on a Siemens Somatom go.Top scanner.  : A retrospective scan was triggered in the ascending thoracic aorta. Axial non-contrast 3 mm slices were carried out through the heart. The data set was analyzed on a dedicated work station and scored using the Agatson method. Gantry rotation speed was 66 msecs and collimation was .6 mm. 50 mg of metoprolol  and 0.8 mg of sl NTG was given. The 3D data set was reconstructed in 5% intervals of the 60-95 % of the R-R cycle. Diastolic phases were analyzed on a dedicated work station using MPR, MIP and VRT modes. The patient received 80 cc of contrast.  FINDINGS: Aorta:  Normal size.  No calcifications.  No dissection.  Aortic Valve:  Trileaflet.  No calcifications.  Coronary Arteries:  Normal coronary origin.  Right dominance.  RCA is a dominant artery. There is no plaque.  Left main gives rise to LAD and LCX arteries. LM has no disease.  LAD has calcified plaque proximally causing minimal stenosis (<25%).  LCX is a non-dominant artery. There is non calcified plaque in the proximal LAD causing severe stenosis (>70%).  Other findings:  Normal pulmonary vein drainage into the left atrium.  Normal left atrial appendage without a thrombus.  Normal size of the pulmonary artery.  IMPRESSION: 1. Coronary calcium score of 2.06. This was 60th  percentile for age and sex matched control.  2. Normal coronary origin with right dominance.  3. Severe proximal LAD stenosis (>70%).  4. CAD-RADS 4 Severe stenosis. (70-99% or > 50% left main). Cardiac catheterization is recommended. Consider symptom-guided anti-ischemic pharmacotherapy as well as risk factor modification per guideline directed care.  5. Additional analysis with CT FFR will be submitted and reported separately.   Electronically Signed By: Constancia Delton M.D. On: 10/08/2023 12:40     ______________________________________________________________________________________________      _____________   History of Present Illness   Pamela Lara is a 63 y.o. female with history of hyperlipidemia, GERD who self-referred for evaluation of chest pain.  She was seen on 10/02/2023 by Dr. Alvenia Aus.  At that visit patient reported recently driving for 15 hours and upon returning home having right sided chest pain described as a tightness and pressure sensation radiating to her right neck and shoulder with associated shortness of breath.  She was concerned about pulmonary embolism and presented to the emergency department for evaluation.  Troponin was normal.  CTA chest in the emergency department was without evidence of pulmonary embolism.  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  Following her visit to the emergency department, patient continued to have intermittent chest pain including with physical activity.  Given her symptoms, a coronary CTA was recommended.  Was completed on 6/9 with finding of severe proximal LAD stenosis greater than 70%.  FFR analysis showed significant stenosis in proximal to mid LAD with FFR CT 0.64.  Given this finding, cardiac catheterization arranged.   Hospital Course   Consultants: n/a   CAD Patient with above history presented for scheduled cardiac catheterization on 6/11, found with severe  one-vessel coronary artery disease with 99% stenosis of proximal LAD involving a large first diagonal branch.  Patient had successful provisional stenting of the LAD with drug coated balloon angioplasty to ostium of the diagonal.  Post intervention, sidebranch reduced to 20% residual stenosis.  Visual LVEF 55 to 65%.  Given complex intervention patient was kept overnight for observation. Overnight had an episode of chest discomfort that resolved with nitroglycerin  x1. This morning she also notes having brief shortness of breath that spontaneously resolved. Isolated 3 beat run of NSVT this morning. Now reports that she is feeling well. Patient stable for discharge.  Patient started on prasugrel, will need uninterrupted dual antiplatelet therapy with aspirin 81 mg daily and prasugrel 10 mg daily for minimum of 12 months (ACS-Class I recommendation).  Continue atorvastatin 80 mg Continue metoprolol  succinate 25 mg once daily Continue as needed sublingual nitroglycerin   Hyperlipidemia Patient with most recent lipid panel in June 2024 showing an LDL of 136. Started on Atorvastatin 80mg  this admission. Continue atorvastatin 80 mg once daily.  Patient will need follow-up lipid panel and LFTs in 6 to 8 weeks.     Did the patient have an acute coronary syndrome (MI, NSTEMI, STEMI, etc) this admission?:  No                               Did the patient have a percutaneous coronary intervention (stent / angioplasty)?:  Yes.     Cath/PCI Registry Performance & Quality Measures: Aspirin prescribed? - Yes ADP Receptor Inhibitor (Plavix/Clopidogrel, Brilinta/Ticagrelor or Effient/Prasugrel) prescribed (includes medically managed patients)? - Yes High Intensity Statin (Lipitor 40-80mg  or Crestor 20-40mg ) prescribed? - Yes For EF <40%, was ACEI/ARB prescribed? - Not Applicable (EF >/= 40%) For EF <40%, Aldosterone Antagonist (Spironolactone or Eplerenone) prescribed? - Not Applicable (EF >/= 40%) Cardiac Rehab  Phase II ordered? - Yes         _____________  Discharge Vitals Blood pressure 130/79, pulse 66, temperature 98 F (36.7 C), temperature source Oral, resp. rate 18, height (P) 5' 4.5 (1.638 m), weight (P) 81.6 kg, last menstrual period 10/03/2015, SpO2 97%.  Filed Weights   10/10/23 1050  Weight: (P) 81.6 kg   Physical Exam Vitals reviewed.  Constitutional:      Appearance: Normal appearance.  HENT:     Head: Normocephalic.     Nose: Nose normal.   Eyes:     Pupils: Pupils are equal, round, and reactive to light.    Cardiovascular:     Rate and Rhythm: Normal rate and regular rhythm.     Pulses: Normal pulses.     Heart sounds: Normal heart sounds. No murmur heard.    No friction rub. No gallop.  Pulmonary:     Effort: Pulmonary effort is normal.     Breath sounds: Normal breath sounds.  Abdominal:     General: Abdomen is flat.   Musculoskeletal:     Right lower leg: No edema.     Left lower leg: No edema.   Skin:    General: Skin is warm and dry.  Capillary Refill: Capillary refill takes less than 2 seconds.     Comments: Left radial access site without bleeding, hematoma, swelling.   Neurological:     General: No focal deficit present.     Mental Status: She is alert and oriented to person, place, and time.   Psychiatric:        Mood and Affect: Mood normal.        Behavior: Behavior normal.        Thought Content: Thought content normal.        Judgment: Judgment normal.      Labs & Radiologic Studies  CBC Recent Labs    10/11/23 0435  WBC 13.9*  HGB 14.0  HCT 39.4  MCV 87.2  PLT 204   Basic Metabolic Panel Recent Labs    16/10/96 0435  NA 139  K 3.4*  CL 106  CO2 23  GLUCOSE 114*  BUN 12  CREATININE 0.77  CALCIUM 8.9   Liver Function Tests No results for input(s): AST, ALT, ALKPHOS, BILITOT, PROT, ALBUMIN in the last 72 hours. No results for input(s): LIPASE, AMYLASE in the last 72 hours. High Sensitivity  Troponin:   Recent Labs  Lab 09/24/23 1244 09/24/23 1520  TROPONINIHS 15 14    No results for input(s): TRNPT in the last 720 hours.  BNP Invalid input(s): POCBNP No results for input(s): PROBNP in the last 72 hours.  No results for input(s): BNP in the last 72 hours.  D-Dimer No results for input(s): DDIMER in the last 72 hours. Hemoglobin A1C No results for input(s): HGBA1C in the last 72 hours. Fasting Lipid Panel No results for input(s): CHOL, HDL, LDLCALC, TRIG, CHOLHDL, LDLDIRECT in the last 72 hours. No results found for: LIPOA  Thyroid  Function Tests No results for input(s): TSH, T4TOTAL, T3FREE, THYROIDAB in the last 72 hours.  Invalid input(s): FREET3 _____________  CARDIAC CATHETERIZATION Result Date: 10/10/2023   Prox LAD to Mid LAD lesion is 99% stenosed with 99% stenosed side branch in 1st Diag.   Mid LAD lesion is 40% stenosed.   A stent was successfully placed.   A drug-eluting stent was successfully placed using a STENT SYNERGY XD 3.50X20.   in the main branch and side branch.   in the main branch and side branch.   Angioplasty was performed in the main branch and side branch. .   Angioplasty was performed in the main branch and side branch. .   Post intervention, there is a 0% residual stenosis.   Post intervention, the side branch was reduced to 20% residual stenosis.   The left ventricular systolic function is normal.   LV end diastolic pressure is mildly elevated.   The left ventricular ejection fraction is 55-65% by visual estimate.   In the absence of any other complications or medical issues, we expect the patient to be ready for discharge from an interventional cardiology perspective on 10/11/2023.   Recommend uninterrupted dual antiplatelet therapy with Aspirin 81mg  daily and Prasugrel 10mg  daily for a minimum of 12 months (ACS-Class I recommendation). 1.  Severe one-vessel coronary artery disease with 99% stenosis in the proximal  LAD involving a large first diagonal. 2.  Normal LV systolic function mildly elevated left ventricular end-diastolic pressure. 3.  Successful provisional stenting of the LAD with drug-coated balloon angioplasty to the ostium of the diagonal. Recommendations: Due to complex intervention, will observe overnight.   CT CORONARY FFR DATA PREP & FLUID ANALYSIS Result Date: 10/09/2023 EXAM: CT FFR  ANALYSIS CLINICAL DATA:  Abnormal CCTA FINDINGS: FFRct analysis was performed on the original cardiac CT angiogram dataset. Diagrammatic representation of the FFRct analysis is provided in a separate PDF document in PACS. This dictation was created using the PDF document and an interactive 3D model of the results. 3D model is not available in the EMR/PACS. Normal FFR range is >0.80. 1. Left Main:  No significant stenosis. 2. LAD: significant stenosis in the proximal-mid LAD.  FFRct 0.64 3. LCX: No significant stenosis.  FFRct 0.97 4. RCA: No significant stenosis.  FFRct 0.96 IMPRESSION: 1. CT FFR analysis showed significant stenosis in the proximal-mid LAD, FFRct 0.64. 2.  Recommend cardiac catheterization. Electronically Signed   By: Constancia Delton M.D.   On: 10/09/2023 08:17   CT CORONARY MORPH W/CTA COR W/SCORE W/CA W/CM &/OR WO/CM Result Date: 10/08/2023 CLINICAL DATA:  Chest pain EXAM: Cardiac/Coronary  CTA TECHNIQUE: The patient was scanned on a Siemens Somatom go.Top scanner. : A retrospective scan was triggered in the ascending thoracic aorta. Axial non-contrast 3 mm slices were carried out through the heart. The data set was analyzed on a dedicated work station and scored using the Agatson method. Gantry rotation speed was 66 msecs and collimation was .6 mm. 50 mg of metoprolol  and 0.8 mg of sl NTG was given. The 3D data set was reconstructed in 5% intervals of the 60-95 % of the R-R cycle. Diastolic phases were analyzed on a dedicated work station using MPR, MIP and VRT modes. The patient received 80 cc of  contrast. FINDINGS: Aorta:  Normal size.  No calcifications.  No dissection. Aortic Valve:  Trileaflet.  No calcifications. Coronary Arteries:  Normal coronary origin.  Right dominance. RCA is a dominant artery. There is no plaque. Left main gives rise to LAD and LCX arteries. LM has no disease. LAD has calcified plaque proximally causing minimal stenosis (<25%). LCX is a non-dominant artery. There is non calcified plaque in the proximal LAD causing severe stenosis (>70%). Other findings: Normal pulmonary vein drainage into the left atrium. Normal left atrial appendage without a thrombus. Normal size of the pulmonary artery. IMPRESSION: 1. Coronary calcium score of 2.06. This was 60th percentile for age and sex matched control. 2. Normal coronary origin with right dominance. 3. Severe proximal LAD stenosis (>70%). 4. CAD-RADS 4 Severe stenosis. (70-99% or > 50% left main). Cardiac catheterization is recommended. Consider symptom-guided anti-ischemic pharmacotherapy as well as risk factor modification per guideline directed care. 5. Additional analysis with CT FFR will be submitted and reported separately. Electronically Signed   By: Constancia Delton M.D.   On: 10/08/2023 12:40   CT Angio Chest PE W and/or Wo Contrast Result Date: 09/24/2023 CLINICAL DATA:  Right chest pain.  Recent long prolonged travel. EXAM: CT ANGIOGRAPHY CHEST WITH CONTRAST TECHNIQUE: Multidetector CT imaging of the chest was performed using the standard protocol during bolus administration of intravenous contrast. Multiplanar CT image reconstructions and MIPs were obtained to evaluate the vascular anatomy. RADIATION DOSE REDUCTION: This exam was performed according to the departmental dose-optimization program which includes automated exposure control, adjustment of the mA and/or kV according to patient size and/or use of iterative reconstruction technique. CONTRAST:  75mL OMNIPAQUE  IOHEXOL  350 MG/ML SOLN COMPARISON:  Chest radiograph  09/24/2023 FINDINGS: Cardiovascular: No filling defect is identified in the pulmonary arterial tree to suggest pulmonary embolus. Aberrant right subclavian artery passes behind the esophagus. Mild thoracic aortic atherosclerosis. Mediastinum/Nodes: Unremarkable Lungs/Pleura: Mild dependent atelectasis in both lower lobes. Upper Abdomen: Cholecystectomy.  Musculoskeletal: Unremarkable Review of the MIP images confirms the above findings. IMPRESSION: 1. No filling defect is identified in the pulmonary arterial tree to suggest pulmonary embolus. 2. Mild dependent atelectasis in both lower lobes. 3. Aberrant right subclavian artery passes behind the esophagus. 4.  Aortic Atherosclerosis (ICD10-I70.0). Electronically Signed   By: Freida Jes M.D.   On: 09/24/2023 17:55   DG Chest 2 View Result Date: 09/24/2023 CLINICAL DATA:  Chest pain EXAM: CHEST - 2 VIEW COMPARISON:  X-ray 05/17/2019. FINDINGS: No consolidation, pneumothorax or effusion. No edema. Normal cardiopericardial silhouette. Degenerative changes along the spine. Overlapping cardiac leads. Surgical clips right upper quadrant of the abdomen IMPRESSION: No acute cardiopulmonary disease. Electronically Signed   By: Adrianna Horde M.D.   On: 09/24/2023 13:03    Disposition Pt is being discharged home today in good condition.  Follow-up Plans & Appointments  Discharge Instructions     AMB Referral to Cardiac Rehabilitation - Phase II   Complete by: As directed    Diagnosis: Coronary Stents   After initial evaluation and assessments completed: Virtual Based Care may be provided alone or in conjunction with Phase 2 Cardiac Rehab based on patient barriers.: Yes   Intensive Cardiac Rehabilitation (ICR) [facility] location only OR Traditional Cardiac Rehabilitation (TCR) *If criteria for ICR are not met will enroll in TCR [facility] only): Yes       Discharge Medications Allergies as of 10/11/2023       Reactions   Ivp Dye [iodinated Contrast Media]  Shortness Of Breath   Morphine And Codeine Nausea And Vomiting   Other    General anesthesia makes her nauseated with vomiting    Wellbutrin  [bupropion ] Other (See Comments)   Joint pain        Medication List     STOP taking these medications    ibuprofen  200 MG tablet Commonly known as: ADVIL        TAKE these medications    acetaminophen  500 MG tablet Commonly known as: TYLENOL  Take 1,000 mg by mouth every 6 (six) hours as needed for moderate pain (pain score 4-6) or mild pain (pain score 1-3).   aspirin EC 81 MG tablet Take 81 mg by mouth daily. Swallow whole.   atorvastatin 80 MG tablet Commonly known as: LIPITOR Take 1 tablet (80 mg total) by mouth daily.   calcium carbonate 500 MG chewable tablet Commonly known as: TUMS - dosed in mg elemental calcium Chew 1 tablet by mouth as needed for indigestion or heartburn.   diphenhydrAMINE  25 MG tablet Commonly known as: BENADRYL  Take 2 tablets (50mg ) by mouth the morning of the procedure with the last dose of the prednisone .   diphenhydrAMINE  25 MG tablet Commonly known as: BENADRYL  Take 2 tablets (50 mg total) by mouth on the morning of the procedure with the last dose of prednisone .   Fish Oil 1000 MG Caps Take 1,800 mg by mouth daily. 900 mg each   Fluarix Quadrivalent  0.5 ML injection Generic drug: influenza vac split quadrivalent PF Inject into the muscle.   [place] Ginseng 1000 MG Tabs Take 1,000 mg by mouth daily.   METAMUCIL FIBER PO Take 15 mLs by mouth every evening.   metoprolol  succinate 25 MG 24 hr tablet Commonly known as: TOPROL -XL Take 1 tablet (25 mg total) by mouth daily.   multivitamin tablet Take 1 tablet by mouth daily.   nitroGLYCERIN  0.4 MG SL tablet Commonly known as: NITROSTAT  Place 1 tablet (0.4 mg total) under the tongue every  5 (five) minutes as needed for chest pain.   omeprazole  20 MG capsule Commonly known as: PRILOSEC Take 20 mg by mouth daily.   prasugrel 10 MG  Tabs tablet Commonly known as: EFFIENT Take 1 tablet (10 mg total) by mouth daily.   predniSONE  50 MG tablet Commonly known as: DELTASONE  Take one 50 mg tablet by mouth 13 hours before the procedure, 7 hours before,  and the last tablet before leaving for the procedure.   Vitamin D3 25 MCG (1000 UT) Caps Take 1,000 Units by mouth daily.         Outstanding Labs/Studies   Duration of Discharge Encounter: APP Time: 25 minutes   Signed, Leala Prince, PA-C 10/11/2023, 9:42 AM   Attending Note:   The patient was seen and examined.  Agree with assessment and plan as noted above.  Changes made to the above note as needed.  Patient seen and independently examined with Leala Prince , PA .   We discussed all aspects of the encounter. I agree with the assessment and plan as stated above.    CAD had a complex stenosis at the bifurcation of the LAD and D1.  S/p stenting of the LAD with PTCA of the diag.  She is doing well   Continue ASA and effient for 1 year. Cautioned her about her L radial cath site .  Will get her a return to work note for Monday    2.  HLD :  cont atorva 80    I have spent a total of 40 minutes with patient reviewing hospital  notes , telemetry, EKGs, labs and examining patient as well as establishing an assessment and plan that was discussed with the patient.  > 50% of time was spent in direct patient care.    [place], Marieta Shorten., MD, [facility] 10/11/2023, 12:01 PM [address]  [address] Office [phone] Pager [phone]

## 2023-10-11 NOTE — Plan of Care (Signed)

## 2023-10-11 NOTE — Discharge Instructions (Signed)
Radial Site Care Refer to this sheet in the next few weeks. These instructions provide you with information on caring for yourself after your procedure. Your caregiver may also give you more specific instructions. Your treatment has been planned according to current medical practices, but problems sometimes occur. Call your caregiver if you have any problems or questions after your procedure. HOME CARE INSTRUCTIONS  You may shower the day after the procedure.Remove the bandage (dressing) and gently wash the site with plain soap and water.Gently pat the site dry.   Do not apply powder or lotion to the site.   Do not submerge the affected site in water for 3 to 5 days.   Inspect the site at least twice daily.   Do not flex or bend the affected arm for 24 hours.   No lifting over 5 pounds (2.3 kg) for 5 days after your procedure.   Do not drive home if you are discharged the same day of the procedure. Have someone else drive you.   You may drive 24 hours after the procedure unless otherwise instructed by your caregiver.  What to expect:  Any bruising will usually fade within 1 to 2 weeks.   Blood that collects in the tissue (hematoma) may be painful to the touch. It should usually decrease in size and tenderness within 1 to 2 weeks.  SEEK IMMEDIATE MEDICAL CARE IF:  You have unusual pain at the radial site.   You have redness, warmth, swelling, or pain at the radial site.   You have drainage (other than a small amount of blood on the dressing).   You have chills.   You have a fever or persistent symptoms for more than 72 hours.   You have a fever and your symptoms suddenly get worse.   Your arm becomes pale, cool, tingly, or numb.   You have heavy bleeding from the site. Hold pressure on the site.

## 2023-10-16 ENCOUNTER — Other Ambulatory Visit (HOSPITAL_COMMUNITY)

## 2023-10-23 ENCOUNTER — Ambulatory Visit: Admitting: Medical

## 2023-10-23 ENCOUNTER — Telehealth: Payer: Self-pay | Admitting: Cardiovascular Disease

## 2023-10-23 NOTE — Telephone Encounter (Signed)
 Received FMLA via fax called patient to fill out forms at front desk

## 2023-10-25 ENCOUNTER — Encounter: Attending: Cardiovascular Disease | Admitting: *Deleted

## 2023-10-25 ENCOUNTER — Encounter: Payer: Self-pay | Admitting: *Deleted

## 2023-10-25 DIAGNOSIS — Z955 Presence of coronary angioplasty implant and graft: Secondary | ICD-10-CM

## 2023-10-25 NOTE — Progress Notes (Signed)
 Virtual orientation call completed today. shehas an appointment on Date: 10/31/2023 for EP eval and gym Orientation.  Documentation of diagnosis can be found in [facility] 10/10/2023 .

## 2023-10-26 ENCOUNTER — Ambulatory Visit: Attending: Nurse Practitioner | Admitting: Nurse Practitioner

## 2023-10-26 ENCOUNTER — Encounter: Payer: Self-pay | Admitting: Nurse Practitioner

## 2023-10-26 VITALS — BP 110/60 | HR 70 | Ht 64.5 in | Wt 179.0 lb

## 2023-10-26 DIAGNOSIS — I2510 Atherosclerotic heart disease of native coronary artery without angina pectoris: Secondary | ICD-10-CM | POA: Diagnosis not present

## 2023-10-26 DIAGNOSIS — E785 Hyperlipidemia, unspecified: Secondary | ICD-10-CM

## 2023-10-26 DIAGNOSIS — Z0279 Encounter for issue of other medical certificate: Secondary | ICD-10-CM

## 2023-10-26 NOTE — Progress Notes (Signed)
 Office Visit    Patient Name: Pamela Lara Date of Encounter: 10/26/2023  Primary Care Provider:  Marylynn Verneita LITTIE, MD Primary Cardiologist:  Deatrice Cage, MD  Chief Complaint    63 y.o. female with a history of CAD, hyperlipidemia, GERD, vertigo, arthritis, and seasonal affective disorder, who presents for follow-up after recent PCI of the LAD/diagonal.  Past Medical History   Subjective   Past Medical History:  Diagnosis Date   Arthritis    right shoulder   BPPV (benign paroxysmal positional vertigo)    CAD (coronary artery disease)    a. 09/2023 Cor CTA: Sev prox/mid LAD dzs w/ FFRct 0.64; b. 09/2023 PCI: LM 99p/m (3.5x20 Synergy DES), 35m, D1 99 (jailed-->DCBA), RI nl, LCX nl, RCA nl, EF 55-65%.   Congenital bowed legs    s/p  surgical correction   Diverticulitis, colon    recurrent   Dysplastic nevus 09/09/2018   R mid post thigh   Dysplastic nevus 07/09/2019   R buttock,moderate atypia   Dysplastic nevus 07/09/2019   L buttock, moderate atypia   Dysplastic nevus 07/09/2019   R lat popliteal, moderate atypia   Ectopic pregnancy 01/29/2000   GERD (gastroesophageal reflux disease)    watch diet (as needed takes apple cider vinager)   History of colonic diverticulitis    hx multiple acute diverticulitis episodes treated medically   History of ectopic pregnancy    History of Helicobacter pylori infection 2013  approx.   Melanoma (HCC) 04/19/2018   R prox bicep (MM[facility])   PONV (postoperative nausea and vomiting)    Seasonal affective disorder [facility])    Past Surgical History:  Procedure Laterality Date   CHOLECYSTECTOMY OPEN  1984   COLONOSCOPY WITH PROPOFOL  N/A 04/19/2017   Procedure: COLONOSCOPY WITH PROPOFOL ;  Surgeon: Jinny Carmine, MD;  Location: [facility];  Service: Endoscopy;  Laterality: N/A;   COLONOSCOPY WITH PROPOFOL  N/A 02/16/2023   Procedure: COLONOSCOPY WITH PROPOFOL ;  Surgeon: Jinny Carmine, MD;  Location: [facility];   Service: Endoscopy;  Laterality: N/A;   CORONARY STENT INTERVENTION N/A 10/10/2023   Procedure: CORONARY STENT INTERVENTION;  Surgeon: Cage Deatrice LABOR, MD;  Location: [facility];  Service: Cardiovascular;  Laterality: N/A;  LAD and DIAG   CYSTOSCOPY WITH STENT PLACEMENT Bilateral 08/15/2017   Procedure: CYSTOSCOPY WITH  FOLEY PLACEMENT WITH BILATERAL open ended catheter placement.  catheters removed at the end of the case.;  Surgeon: Carolee Sherwood JONETTA DOUGLAS, MD;  Location: [facility];  Service: Urology;  Laterality: Bilateral;   EXCISIONAL SENTINAL LYMPH NODE BIOPSY, NECK N/A 2016  approx.   per pt thyroid  area , benign   LAPAROSCOPIC SIGMOID COLECTOMY N/A 08/15/2017   Procedure: LAPAROSCOPIC SIGMOID COLECTOMY  ERAS PATHWAY;  Surgeon: Teresa Lonni HERO, MD;  Location: [facility];  Service: General;  Laterality: N/A;   LAPAROSCOPY FOR ECTOPIC PREGNANCY  2001   also  BILATERAL TUBAL LIGATION   LEFT HEART CATH AND CORONARY ANGIOGRAPHY N/A 10/10/2023   Procedure: LEFT HEART CATH AND CORONARY ANGIOGRAPHY;  Surgeon: Cage Deatrice LABOR, MD;  Location: [facility];  Service: Cardiovascular;  Laterality: N/A;   OSTEOTOMY Bilateral 1980 and 1981   correction congenital bow legged   POLYPECTOMY N/A 02/16/2023   Procedure: POLYPECTOMY;  Surgeon: Jinny Carmine, MD;  Location: [facility];  Service: Endoscopy;  Laterality: N/A;   TONSILLECTOMY      Allergies  Allergies  Allergen Reactions   Ivp Dye [Iodinated Contrast Media] Shortness  Of Breath   Morphine And Codeine Nausea And Vomiting   Other     General anesthesia makes her nauseated with vomiting     Wellbutrin  [Bupropion ] Other (See Comments)    Joint pain       History of Present Illness      63 y.o. y/o female with above past medical history including CAD, hyperlipidemia, GERD, vertigo, arthritis, and seasonal affective disorder.  She established care with Dr. Darron in June 2025 after ED visit for right-sided chest pain and  tightness associated dyspnea.  In the ED, troponin was normal and CTA was negative for PE.  Following cardiology visit, she underwent coronary CT angiogram which suggested severe proximal LAD disease with an FFRct of 0.64.  She underwent diagnostic catheterization which confirmed severe proximal LAD disease and this was successfully treated with drug-eluting stent.  The first diagonal was treated with drug-coated balloon angioplasty and she was subsequently discharged home on dual antiplatelet therapy.   Since discharge, she has noted steady improvement in activity tolerance and has not been experiencing chest pain.  Her left radial cath site has healed well.  She continues to feel some degree of shock and disbelief that this is occurred, as she has always tried to take care of herself.  She has not been having palpitations, PND, orthopnea, dizziness, syncope, edema, or early satiety.  Thus far, she is tolerating medications well without melena or bright red blood per rectum. Objective   Home Medications    Current Outpatient Medications  Medication Sig Dispense Refill   acetaminophen  (TYLENOL ) 500 MG tablet Take 1,000 mg by mouth every 6 (six) hours as needed for moderate pain (pain score 4-6) or mild pain (pain score 1-3).     aspirin  EC 81 MG tablet Take 81 mg by mouth daily. Swallow whole.     atorvastatin  (LIPITOR) 80 MG tablet Take 1 tablet (80 mg total) by mouth daily. 90 tablet 3   calcium  carbonate (TUMS - DOSED IN MG ELEMENTAL CALCIUM ) 500 MG chewable tablet Chew 1 tablet by mouth as needed for indigestion or heartburn.     Cholecalciferol  (VITAMIN D3) 1000 UNITS CAPS Take 1,000 Units by mouth daily.     influenza vac split quadrivalent PF (FLUARIX  QUADRIVALENT) 0.5 ML injection Inject into the muscle. 0.5 mL 0   [place] Ginseng 1000 MG TABS Take 1,000 mg by mouth daily.      Multiple Vitamin (MULTIVITAMIN) tablet Take 1 tablet by mouth daily.     nitroGLYCERIN  (NITROSTAT ) 0.4 MG SL tablet  Place 1 tablet (0.4 mg total) under the tongue every 5 (five) minutes as needed for chest pain. 25 tablet 3   Omega-3 Fatty Acids (FISH OIL) 1000 MG CAPS Take 1,800 mg by mouth daily. 900 mg each     omeprazole  (PRILOSEC) 20 MG capsule Take 20 mg by mouth daily. (Patient taking differently: Take 20 mg by mouth daily. Taking as needed)     prasugrel  (EFFIENT ) 10 MG TABS tablet Take 1 tablet (10 mg total) by mouth daily. 90 tablet 3   Psyllium (METAMUCIL FIBER PO) Take 15 mLs by mouth every evening.     No current facility-administered medications for this visit.     Physical Exam    VS:  BP 110/60   Pulse 70   Ht 5' 4.5 (1.638 m)   Wt 179 lb (81.2 kg)   LMP 10/03/2015 (Approximate)   SpO2 94%   BMI 30.25 kg/m  , BMI Body mass index  is 30.25 kg/m.          GEN: Well nourished, well developed, in no acute distress. HEENT: normal. Neck: Supple, no JVD, carotid bruits, or masses. Cardiac: RRR, no murmurs, rubs, or gallops. No clubbing, cyanosis, edema.  Radials 2+/PT 2+ and equal bilaterally.  Left radial catheterization site without bleeding, bruit, or hematoma. Respiratory:  Respirations regular and unlabored, clear to auscultation bilaterally. GI: Soft, nontender, nondistended, BS + x 4. MS: no deformity or atrophy. Skin: warm and dry, no rash. Neuro:  Strength and sensation are intact. Psych: Normal affect.  Accessory Clinical Findings    ECG personally reviewed by me today - EKG Interpretation Date/Time:  Friday October 26 2023 14:09:12 EDT Ventricular Rate:  70 PR Interval:  156 QRS Duration:  90 QT Interval:  404 QTC Calculation: 436 R Axis:   68  Text Interpretation: Normal sinus rhythm Normal ECG Confirmed by Vivienne Bruckner [id number]) on 10/26/2023 2:14:46 PM  - no acute changes.  Lab Results  Component Value Date   WBC 13.9 (H) 10/11/2023   HGB 14.0 10/11/2023   HCT 39.4 10/11/2023   MCV 87.2 10/11/2023   PLT 204 10/11/2023   Lab Results  Component Value  Date   CREATININE 0.77 10/11/2023   BUN 12 10/11/2023   NA 139 10/11/2023   K 3.4 (L) 10/11/2023   CL 106 10/11/2023   CO2 23 10/11/2023   Lab Results  Component Value Date   ALT 43 (H) 10/05/2022   AST 25 10/05/2022   ALKPHOS 59 10/05/2022   BILITOT 0.8 10/05/2022   Lab Results  Component Value Date   CHOL 195 10/05/2022   HDL 54.80 10/05/2022   LDLCALC 105 (H) 10/05/2022   LDLDIRECT 136.0 10/05/2022   TRIG 178.0 (H) 10/05/2022   CHOLHDL 4 10/05/2022    Lab Results  Component Value Date   HGBA1C 5.4 10/05/2022   Lab Results  Component Value Date   TSH 1.18 10/05/2022       Assessment & Plan    1.  Coronary artery disease: Patient recently evaluated due to reduced exercise tolerance following episode of right shoulder pain and dyspnea.  Coronary CT angiogram suggest severe LAD disease.  Diagnostic catheterization confirmed severe LAD and first diagonal disease with otherwise normal coronary arteries and normal LV function.  The LAD was successfully treated with drug-eluting stent while the first diagonal was treated with drug-coated balloon angioplasty.  Following discharge, she has done well and has noted improved activity tolerance daily.  She has not been having chest pain.  She notes that her blood pressure has returned to prior baseline postprocedure and she is interested in coming off of metoprolol , which we will discontinue today.  She continues to have some shock and disbelief regarding her CAD diagnosis.  We discussed that this is to be expected and that some patients will benefit from SSRI therapy postprocedure/events.  She remains on aspirin , Effient , fish oil, and statin therapy.  She is interested in pursuing cardiac rehabilitation and is ready to begin from my standpoint.  2.  Hyperlipidemia/hypertriglyceridemia: Long history of hypertriglyceridemia, which she has managed with fish oil.  LDL of 136 last June.  In the setting of above, she is now on atorvastatin   therapy, and has been tolerating.  Will arrange for follow-up lipids and LFTs in a month.  3.  Disposition: Follow-up lipids and LFTs in 1 month.  Follow-up in clinic in 1 month.  Bruckner Vivienne, NP 10/26/2023, 2:58 PM

## 2023-10-26 NOTE — Patient Instructions (Signed)
 Medication Instructions:  Your physician recommends that you continue on your current medications as directed. Please refer to the Current Medication list given to you today.   *If you need a refill on your cardiac medications before your next appointment, please call your pharmacy*  Lab Work: Your provider would like for you to return in One Month to have the following labs drawn: Lipid and Hepatic Function Panels.   Please go to [facility] [address] ([facility] [address], [place] [address] You do not need an appointment.  They are open from 8 am- 4:30 pm.  Lunch from 1:00 pm- 2:00 pm You DO need to be fasting.   You may also go to one of the following [facility]:  [address] [place], [place] [address] Phone: [phone] Lab hours: Mon-Fri 8 am- 5 pm    Lunch 12 pm- 1 pm  [address] [place],  [place]  [address]  [place]  Phone: [phone] Lab hours: 7 am- 4 pm Lunch 12 pm-1 pm   [address] [place],  [place]  [address]  [place]  Phone: [phone] Lab hours: Mon-Fri 8 am- 5 pm    Lunch 12 pm- 1 pm  If you have labs (blood work) drawn today and your tests are completely normal, you will receive your results only by: MyChart Message (if you have MyChart) OR A paper copy in the mail If you have any lab test that is abnormal or we need to change your treatment, we will call you to review the results.  Testing/Procedures: None ordered at this time   Follow-Up: At [facility], you and your health needs are our priority.  As part of our continuing mission to provide you with exceptional heart care, our providers are all part of one team.  This team includes your primary Cardiologist (physician) and Advanced Practice Providers or APPs (Physician Assistants and Nurse Practitioners) who all work together to provide you with the care you need, when you need it.  Your next appointment:   1 month(s)  Provider:   Deatrice Cage, MD    We recommend  signing up for the patient portal called MyChart.  Sign up information is provided on this After Visit Summary.  MyChart is used to connect with patients for Virtual Visits (Telemedicine).  Patients are able to view lab/test results, encounter notes, upcoming appointments, etc.  Non-urgent messages can be sent to your provider as well.   To learn more about what you can do with MyChart, go to [email or web address].

## 2023-10-26 NOTE — Telephone Encounter (Signed)
 Patient paid $29 cash for [employer] forms

## 2023-10-29 ENCOUNTER — Encounter: Payer: Self-pay | Admitting: Internal Medicine

## 2023-10-29 NOTE — Telephone Encounter (Signed)
 Completed forms have been given back to be scanned and faxed.

## 2023-10-29 NOTE — Telephone Encounter (Signed)
 Faxed & scanned forms as requested, called patient to notify completion.

## 2023-10-31 ENCOUNTER — Encounter: Attending: Cardiovascular Disease

## 2023-10-31 VITALS — Ht 64.7 in | Wt 178.5 lb

## 2023-10-31 DIAGNOSIS — Z955 Presence of coronary angioplasty implant and graft: Secondary | ICD-10-CM | POA: Insufficient documentation

## 2023-10-31 DIAGNOSIS — Z48812 Encounter for surgical aftercare following surgery on the circulatory system: Secondary | ICD-10-CM | POA: Insufficient documentation

## 2023-10-31 NOTE — Patient Instructions (Signed)
 Patient Instructions  Patient Details  Name: Pamela Lara MRN: [id number] Date of Birth: [birth year] Referring Provider:  Marylynn Verneita LITTIE, MD  Below are your personal goals for exercise, nutrition, and risk factors. Our goal is to help you stay on track towards obtaining and maintaining these goals. We will be discussing your progress on these goals with you throughout the program.  Initial Exercise Prescription:  Initial Exercise Prescription - 10/31/23 1200       Date of Initial Exercise RX and Referring Provider   Date 10/31/23    Referring Provider Dr. Deatrice Cage      Oxygen   Maintain Oxygen Saturation 88% or higher      Treadmill   MPH 2.8    Grade 1    Minutes 15    METs 3.53      Elliptical   Level 1    Speed 3    Minutes 15    METs 3.32      Rower   Level 3    Minutes 15    METs 3.32      Intensity   THRR 40-80% of Max Heartrate 102-139    Ratings of Perceived Exertion 11-13    Perceived Dyspnea 0-4      Progression   Progression Continue to progress workloads to maintain intensity without signs/symptoms of physical distress.      Resistance Training   Training Prescription Yes    Weight 5lb    Reps 10-15          Exercise Goals: Frequency: Be able to perform aerobic exercise two to three times per week in program working toward 2-5 days per week of home exercise.  Intensity: Work with a perceived exertion of 11 (fairly light) - 15 (hard) while following your exercise prescription.  We will make changes to your prescription with you as you progress through the program.   Duration: Be able to do 30 to 45 minutes of continuous aerobic exercise in addition to a 5 minute warm-up and a 5 minute cool-down routine.   Nutrition Goals: Your personal nutrition goals will be established when you do your nutrition analysis with the dietician.  The following are general nutrition guidelines to follow: Cholesterol < 200mg /day Sodium <  1500mg /day Fiber: Women over 50 yrs - 21 grams per day  Personal Goals:  Personal Goals and Risk Factors at Admission - 10/25/23 1348       Core Components/Risk Factors/Patient Goals on Admission    Weight Management Yes;Weight Loss    Intervention Weight Management: Develop a combined nutrition and exercise program designed to reach desired caloric intake, while maintaining appropriate intake of nutrient and fiber, sodium and fats, and appropriate energy expenditure required for the weight goal.;Weight Management: Provide education and appropriate resources to help participant work on and attain dietary goals.    Admit Weight 177 lb (80.3 kg)    Goal Weight: Short Term 176 lb (79.8 kg)    Goal Weight: Long Term 162 lb (73.5 kg)    Expected Outcomes Short Term: Continue to assess and modify interventions until short term weight is achieved;Long Term: Adherence to nutrition and physical activity/exercise program aimed toward attainment of established weight goal;Weight Loss: Understanding of general recommendations for a balanced deficit meal plan, which promotes 1-2 lb weight loss per week and includes a negative energy balance of [phone] kcal/d    Lipids Yes    Intervention Provide education and support for participant on nutrition & aerobic/resistive exercise  along with prescribed medications to achieve LDL 70mg , HDL >40mg .    Expected Outcomes Short Term: Participant states understanding of desired cholesterol values and is compliant with medications prescribed. Participant is following exercise prescription and nutrition guidelines.;Long Term: Cholesterol controlled with medications as prescribed, with individualized exercise RX and with personalized nutrition plan. Value goals: LDL < 70mg , HDL > 40 mg.          Exercise Goals and Review:  Exercise Goals     Row Name 10/31/23 1315             Exercise Goals   Increase Physical Activity Yes       Intervention Provide advice,  education, support and counseling about physical activity/exercise needs.;Develop an individualized exercise prescription for aerobic and resistive training based on initial evaluation findings, risk stratification, comorbidities and participant's personal goals.       Expected Outcomes Short Term: Attend rehab on a regular basis to increase amount of physical activity.;Long Term: Add in home exercise to make exercise part of routine and to increase amount of physical activity.;Long Term: Exercising regularly at least 3-5 days a week.       Increase Strength and Stamina Yes       Intervention Provide advice, education, support and counseling about physical activity/exercise needs.;Develop an individualized exercise prescription for aerobic and resistive training based on initial evaluation findings, risk stratification, comorbidities and participant's personal goals.       Expected Outcomes Short Term: Increase workloads from initial exercise prescription for resistance, speed, and METs.;Short Term: Perform resistance training exercises routinely during rehab and add in resistance training at home;Long Term: Improve cardiorespiratory fitness, muscular endurance and strength as measured by increased METs and functional capacity ( )       Able to understand and use rate of perceived exertion (RPE) scale Yes       Intervention Provide education and explanation on how to use RPE scale       Expected Outcomes Short Term: Able to use RPE daily in rehab to express subjective intensity level;Long Term:  Able to use RPE to guide intensity level when exercising independently       Able to understand and use Dyspnea scale Yes       Intervention Provide education and explanation on how to use Dyspnea scale       Expected Outcomes Long Term: Able to use Dyspnea scale to guide intensity level when exercising independently;Short Term: Able to use Dyspnea scale daily in rehab to express subjective sense of shortness of  breath during exertion       Knowledge and understanding of Target Heart Rate Range (THRR) Yes       Intervention Provide education and explanation of THRR including how the numbers were predicted and where they are located for reference       Expected Outcomes Short Term: Able to use daily as guideline for intensity in rehab;Short Term: Able to state/look up THRR;Long Term: Able to use THRR to govern intensity when exercising independently       Able to check pulse independently Yes       Intervention Provide education and demonstration on how to check pulse in carotid and radial arteries.;Review the importance of being able to check your own pulse for safety during independent exercise       Expected Outcomes Short Term: Able to explain why pulse checking is important during independent exercise;Long Term: Able to check pulse independently and accurately  Understanding of Exercise Prescription Yes       Intervention Provide education, explanation, and written materials on patient's individual exercise prescription       Expected Outcomes Long Term: Able to explain home exercise prescription to exercise independently;Short Term: Able to explain program exercise prescription          Copy of goals given to participant.

## 2023-10-31 NOTE — Progress Notes (Signed)
 Cardiac Individual Treatment Plan  Patient Details  Name: Pamela Lara MRN: [id number] Date of Birth: Sep 02, 1960 Referring Provider:   Flowsheet Row Cardiac Rehab from 10/31/2023 in [facility] Cardiac and Pulmonary Rehab  Referring Provider Dr. Deatrice Cage    Initial Encounter Date:  Flowsheet Row Cardiac Rehab from 10/31/2023 in [facility] Cardiac and Pulmonary Rehab  Date 10/31/23    Visit Diagnosis: Status post coronary artery stent placement  Patient's Home Medications on Admission:  Current Outpatient Medications:    acetaminophen  (TYLENOL ) 500 MG tablet, Take 1,000 mg by mouth every 6 (six) hours as needed for moderate pain (pain score 4-6) or mild pain (pain score 1-3)., Disp: , Rfl:    aspirin  EC 81 MG tablet, Take 81 mg by mouth daily. Swallow whole., Disp: , Rfl:    atorvastatin  (LIPITOR) 80 MG tablet, Take 1 tablet (80 mg total) by mouth daily., Disp: 90 tablet, Rfl: 3   calcium  carbonate (TUMS - DOSED IN MG ELEMENTAL CALCIUM ) 500 MG chewable tablet, Chew 1 tablet by mouth as needed for indigestion or heartburn., Disp: , Rfl:    Cholecalciferol  (VITAMIN D3) 1000 UNITS CAPS, Take 1,000 Units by mouth daily., Disp: , Rfl:    influenza vac split quadrivalent PF (FLUARIX  QUADRIVALENT) 0.5 ML injection, Inject into the muscle., Disp: 0.5 mL, Rfl: 0   [place] Ginseng 1000 MG TABS, Take 1,000 mg by mouth daily. , Disp: , Rfl:    Multiple Vitamin (MULTIVITAMIN) tablet, Take 1 tablet by mouth daily., Disp: , Rfl:    nitroGLYCERIN  (NITROSTAT ) 0.4 MG SL tablet, Place 1 tablet (0.4 mg total) under the tongue every 5 (five) minutes as needed for chest pain., Disp: 25 tablet, Rfl: 3   Omega-3 Fatty Acids (FISH OIL) 1000 MG CAPS, Take 1,800 mg by mouth daily. 900 mg each, Disp: , Rfl:    omeprazole  (PRILOSEC) 20 MG capsule, Take 20 mg by mouth daily. (Patient taking differently: Take 20 mg by mouth daily. Taking as needed), Disp: , Rfl:    prasugrel  (EFFIENT ) 10 MG TABS tablet, Take 1 tablet (10  mg total) by mouth daily., Disp: 90 tablet, Rfl: 3   Psyllium (METAMUCIL FIBER PO), Take 15 mLs by mouth every evening., Disp: , Rfl:   Past Medical History: Past Medical History:  Diagnosis Date   Arthritis    right shoulder   BPPV (benign paroxysmal positional vertigo)    CAD (coronary artery disease)    a. 09/2023 Cor CTA: Sev prox/mid LAD dzs w/ FFRct 0.64; b. 09/2023 PCI: LM 99p/m (3.5x20 Synergy DES), 52m, D1 99 (jailed-->DCBA), RI nl, LCX nl, RCA nl, EF 55-65%.   Congenital bowed legs    s/p  surgical correction   Diverticulitis, colon    recurrent   Dysplastic nevus 09/09/2018   R mid post thigh   Dysplastic nevus 07/09/2019   R buttock,moderate atypia   Dysplastic nevus 07/09/2019   L buttock, moderate atypia   Dysplastic nevus 07/09/2019   R lat popliteal, moderate atypia   Ectopic pregnancy 01/29/2000   GERD (gastroesophageal reflux disease)    watch diet (as needed takes apple cider vinager)   History of colonic diverticulitis    hx multiple acute diverticulitis episodes treated medically   History of ectopic pregnancy    History of Helicobacter pylori infection 2013  approx.   Melanoma (HCC) 04/19/2018   R prox bicep (MMIS)   PONV (postoperative nausea and vomiting)    Seasonal affective disorder (HCC)     Tobacco Use:  Social History   Tobacco Use  Smoking Status Former   Current packs/day: 0.00   Average packs/day: 0.3 packs/day for 38.0 years (9.5 ttl pk-yrs)   Types: Cigarettes   Start date: 06/29/1979   Quit date: 06/28/2017   Years since quitting: 6.3  Smokeless Tobacco Never  Tobacco Comments   since age 44/  07-27-2017 per pt quit smoking 02/ 28/ 2019 cold [place]    Labs: Review Flowsheet  More data exists      Latest Ref Rng & Units 10/09/2019 09/20/2020 09/21/2020 09/29/2021 10/05/2022  Labs for ITP Cardiac and Pulmonary Rehab  Cholestrol 0 - 200 mg/dL 775  777  - 776  804   LDL (calc) 0 - 99 mg/dL - 885  - - 894   Direct LDL mg/dL 858.9  - -  860.9  863.9   HDL-C >39.00 mg/dL 47.39  51  - 43.99  45.19   Trlycerides 0.0 - 149.0 mg/dL 783.9  714  - 751.9  821.9   Hemoglobin A1c 4.6 - 6.5 % 5.5  - 5.3  5.4  5.4      Exercise Target Goals: Exercise Program Goal: Individual exercise prescription set using results from initial 6 min walk test and THRR while considering  patient's activity barriers and safety.   Exercise Prescription Goal: Initial exercise prescription builds to 30-45 minutes a day of aerobic activity, 2-3 days per week.  Home exercise guidelines will be given to patient during program as part of exercise prescription that the participant will acknowledge.   Education: Aerobic Exercise: - Group verbal and visual presentation on the components of exercise prescription. Introduces F.I.T.T principle from ACSM for exercise prescriptions.  Reviews F.I.T.T. principles of aerobic exercise including progression. Written material given at graduation.   Education: Resistance Exercise: - Group verbal and visual presentation on the components of exercise prescription. Introduces F.I.T.T principle from ACSM for exercise prescriptions  Reviews F.I.T.T. principles of resistance exercise including progression. Written material given at graduation.    Education: Exercise & Equipment Safety: - Individual verbal instruction and demonstration of equipment use and safety with use of the equipment. Flowsheet Row Cardiac Rehab from 10/31/2023 in [facility] Cardiac and Pulmonary Rehab  Date 10/31/23  Educator [facility]  Instruction Review Code 1- Verbalizes Understanding    Education: Exercise Physiology & General Exercise Guidelines: - Group verbal and written instruction with models to review the exercise physiology of the cardiovascular system and associated critical values. Provides general exercise guidelines with specific guidelines to those with heart or lung disease.    Education: Flexibility, Balance, Mind/Body Relaxation: - Group verbal  and visual presentation with interactive activity on the components of exercise prescription. Introduces F.I.T.T principle from ACSM for exercise prescriptions. Reviews F.I.T.T. principles of flexibility and balance exercise training including progression. Also discusses the mind body connection.  Reviews various relaxation techniques to help reduce and manage stress (i.e. Deep breathing, progressive muscle relaxation, and visualization). Balance handout provided to take home. Written material given at graduation.   Activity Barriers & Risk Stratification:  Activity Barriers & Cardiac Risk Stratification - 10/31/23 1205       Activity Barriers & Cardiac Risk Stratification   Activity Barriers None    Cardiac Risk Stratification Moderate          6 Minute Walk:  6 Minute Walk     Row Name 10/31/23 1204         6 Minute Walk   Phase Initial     Distance 1500  feet     Walk Time 6 minutes     # of Rest Breaks 0     MPH 2.84     METS 3.32     RPE 9     Perceived Dyspnea  0     VO2 Peak 11.6     Symptoms No     Resting HR 66 bpm     Resting BP 112/64     Resting Oxygen Saturation  97 %     Exercise Oxygen Saturation  during 6 min walk 99 %     Max Ex. HR 91 bpm     Max Ex. BP 116/62     2 Minute Post BP 114/62        Oxygen Initial Assessment:   Oxygen Re-Evaluation:   Oxygen Discharge (Final Oxygen Re-Evaluation):   Initial Exercise Prescription:  Initial Exercise Prescription - 10/31/23 1200       Date of Initial Exercise RX and Referring Provider   Date 10/31/23    Referring Provider Dr. Deatrice Cage      Oxygen   Maintain Oxygen Saturation 88% or higher      Treadmill   MPH 2.8    Grade 1    Minutes 15    METs 3.53      Elliptical   Level 1    Speed 3    Minutes 15    METs 3.32      Rower   Level 3    Minutes 15    METs 3.32      Intensity   THRR 40-80% of Max Heartrate 102-139    Ratings of Perceived Exertion 11-13    Perceived  Dyspnea 0-4      Progression   Progression Continue to progress workloads to maintain intensity without signs/symptoms of physical distress.      Resistance Training   Training Prescription Yes    Weight 5lb    Reps 10-15          Perform Capillary Blood Glucose checks as needed.  Exercise Prescription Changes:   Exercise Prescription Changes     Row Name 10/31/23 1200             Response to Exercise   Blood Pressure (Admit) 112/64       Blood Pressure (Exercise) 116/62       Blood Pressure (Exit) 114/62       Heart Rate (Admit) 66 bpm       Heart Rate (Exercise) 91 bpm       Heart Rate (Exit) 74 bpm       Oxygen Saturation (Admit) 97 %       Oxygen Saturation (Exercise) 99 %       Oxygen Saturation (Exit) 99 %       Rating of Perceived Exertion (Exercise) 9       Perceived Dyspnea (Exercise) 0       Symptoms results          Exercise Comments:   Exercise Goals and Review:   Exercise Goals     Row Name 10/31/23 1315             Exercise Goals   Increase Physical Activity Yes       Intervention Provide advice, education, support and counseling about physical activity/exercise needs.;Develop an individualized exercise prescription for aerobic and resistive training based on initial evaluation findings, risk stratification, comorbidities and participant's personal goals.  Expected Outcomes Short Term: Attend rehab on a regular basis to increase amount of physical activity.;Long Term: Add in home exercise to make exercise part of routine and to increase amount of physical activity.;Long Term: Exercising regularly at least 3-5 days a week.       Increase Strength and Stamina Yes       Intervention Provide advice, education, support and counseling about physical activity/exercise needs.;Develop an individualized exercise prescription for aerobic and resistive training based on initial evaluation findings, risk stratification, comorbidities and  participant's personal goals.       Expected Outcomes Short Term: Increase workloads from initial exercise prescription for resistance, speed, and METs.;Short Term: Perform resistance training exercises routinely during rehab and add in resistance training at home;Long Term: Improve cardiorespiratory fitness, muscular endurance and strength as measured by increased METs and functional capacity ( )       Able to understand and use rate of perceived exertion (RPE) scale Yes       Intervention Provide education and explanation on how to use RPE scale       Expected Outcomes Short Term: Able to use RPE daily in rehab to express subjective intensity level;Long Term:  Able to use RPE to guide intensity level when exercising independently       Able to understand and use Dyspnea scale Yes       Intervention Provide education and explanation on how to use Dyspnea scale       Expected Outcomes Long Term: Able to use Dyspnea scale to guide intensity level when exercising independently;Short Term: Able to use Dyspnea scale daily in rehab to express subjective sense of shortness of breath during exertion       Knowledge and understanding of Target Heart Rate Range (THRR) Yes       Intervention Provide education and explanation of THRR including how the numbers were predicted and where they are located for reference       Expected Outcomes Short Term: Able to use daily as guideline for intensity in rehab;Short Term: Able to state/look up THRR;Long Term: Able to use THRR to govern intensity when exercising independently       Able to check pulse independently Yes       Intervention Provide education and demonstration on how to check pulse in carotid and radial arteries.;Review the importance of being able to check your own pulse for safety during independent exercise       Expected Outcomes Short Term: Able to explain why pulse checking is important during independent exercise;Long Term: Able to check pulse  independently and accurately       Understanding of Exercise Prescription Yes       Intervention Provide education, explanation, and written materials on patient's individual exercise prescription       Expected Outcomes Long Term: Able to explain home exercise prescription to exercise independently;Short Term: Able to explain program exercise prescription          Exercise Goals Re-Evaluation :   Discharge Exercise Prescription (Final Exercise Prescription Changes):  Exercise Prescription Changes - 10/31/23 1200       Response to Exercise   Blood Pressure (Admit) 112/64    Blood Pressure (Exercise) 116/62    Blood Pressure (Exit) 114/62    Heart Rate (Admit) 66 bpm    Heart Rate (Exercise) 91 bpm    Heart Rate (Exit) 74 bpm    Oxygen Saturation (Admit) 97 %    Oxygen Saturation (Exercise) 99 %  Oxygen Saturation (Exit) 99 %    Rating of Perceived Exertion (Exercise) 9    Perceived Dyspnea (Exercise) 0    Symptoms results          Nutrition:  Target Goals: Understanding of nutrition guidelines, daily intake of sodium 1500mg , cholesterol 200mg , calories 30% from fat and 7% or less from saturated fats, daily to have 5 or more servings of fruits and vegetables.  Education: All About Nutrition: -Group instruction provided by verbal, written material, interactive activities, discussions, models, and posters to present general guidelines for heart healthy nutrition including fat, fiber, MyPlate, the role of sodium in heart healthy nutrition, utilization of the nutrition label, and utilization of this knowledge for meal planning. Follow up email sent as well. Written material given at graduation. Flowsheet Row Cardiac Rehab from 10/31/2023 in [facility] Cardiac and Pulmonary Rehab  Education need identified 10/31/23    Biometrics:  Pre Biometrics - 10/31/23 1316       Pre Biometrics   Height 5' 4.7 (1.643 m)    Weight 178 lb 8 oz (81 kg)    Waist Circumference 36 inches     Hip Circumference 40 inches    Waist to Hip Ratio 0.9 %    BMI (Calculated) 29.99    Single Leg Stand 30 seconds           Nutrition Therapy Plan and Nutrition Goals:   Nutrition Assessments:  MEDIFICTS Score Key: >=70 Need to make dietary changes  40-70 Heart Healthy Diet <= 40 Therapeutic Level Cholesterol Diet  Flowsheet Row Cardiac Rehab from 10/31/2023 in [facility] Cardiac and Pulmonary Rehab  Picture Your Plate Total Score on Admission 63   Picture Your Plate Scores: <59 Unhealthy dietary pattern with much room for improvement. 41-50 Dietary pattern unlikely to meet recommendations for good health and room for improvement. 51-60 More healthful dietary pattern, with some room for improvement.  >60 Healthy dietary pattern, although there may be some specific behaviors that could be improved.    Nutrition Goals Re-Evaluation:   Nutrition Goals Discharge (Final Nutrition Goals Re-Evaluation):   Psychosocial: Target Goals: Acknowledge presence or absence of significant depression and/or stress, maximize coping skills, provide positive support system. Participant is able to verbalize types and ability to use techniques and skills needed for reducing stress and depression.   Education: Stress, Anxiety, and Depression - Group verbal and visual presentation to define topics covered.  Reviews how body is impacted by stress, anxiety, and depression.  Also discusses healthy ways to reduce stress and to treat/manage anxiety and depression.  Written material given at graduation.   Education: Sleep Hygiene -Provides group verbal and written instruction about how sleep can affect your health.  Define sleep hygiene, discuss sleep cycles and impact of sleep habits. Review good sleep hygiene tips.    Initial Review & Psychosocial Screening:  Initial Psych Review & Screening - 10/25/23 1342       Initial Review   Current issues with None Identified      Family Dynamics   Good  Support System? Yes   husband children     Barriers   Psychosocial barriers to participate in program There are no identifiable barriers or psychosocial needs.      Screening Interventions   Interventions To provide support and resources with identified psychosocial needs;Provide feedback about the scores to participant    Expected Outcomes Short Term goal: Utilizing psychosocial counselor, staff and physician to assist with identification of specific Stressors or current  issues interfering with healing process. Setting desired goal for each stressor or current issue identified.;Long Term Goal: Stressors or current issues are controlled or eliminated.;Short Term goal: Identification and review with participant of any Quality of Life or Depression concerns found by scoring the questionnaire.;Long Term goal: The participant improves quality of Life and PHQ9 Scores as seen by post scores and/or verbalization of changes          Quality of Life Scores:   Quality of Life - 10/31/23 1316       Quality of Life   Select Quality of Life      Quality of Life Scores   Health/Function Pre 23.17 %    Socioeconomic Pre 28.13 %    Psych/Spiritual Pre 26.5 %    Family Pre 30 %    GLOBAL Pre 25.94 %         Scores of 19 and below usually indicate a poorer quality of life in these areas.  A difference of  2-3 points is a clinically meaningful difference.  A difference of 2-3 points in the total score of the Quality of Life Index has been associated with significant improvement in overall quality of life, self-image, physical symptoms, and general health in studies assessing change in quality of life.  PHQ-9: Review Flowsheet  More data exists      10/31/2023 10/05/2022 09/29/2021 09/21/2020 08/09/2016  Depression screen PHQ 2/9  Decreased Interest 0 0 0 0 0  Down, Depressed, Hopeless 0 0 0 0 0  PHQ - 2 Score 0 0 0 0 0  Altered sleeping 0 0 - 0 -  Tired, decreased energy 2 3 - 0 -  Change in appetite  0 0 - 0 -  Feeling bad or failure about yourself  0 0 - 0 -  Trouble concentrating 0 0 - 0 -  Moving slowly or fidgety/restless 0 0 - 0 -  Suicidal thoughts 0 0 - 0 -  PHQ-9 Score 2 3 - 0 -  Difficult doing work/chores Somewhat difficult Not difficult at all - Not difficult at all -   Interpretation of Total Score  Total Score Depression Severity:  1-4 = Minimal depression, 5-9 = Mild depression, 10-14 = Moderate depression, 15-19 = Moderately severe depression, 20-27 = Severe depression   Psychosocial Evaluation and Intervention:  Psychosocial Evaluation - 10/25/23 1513       Psychosocial Evaluation & Interventions   Interventions Encouraged to exercise with the program and follow exercise prescription    Comments There are no barriers to attending the program.  She has support from her husband and children.  She was walking 4 minles a week and swimming some prior to the stent.   She walks 10,000 steps a day during wrok three days a week.  She is ready to get started    Expected Outcomes STG attend all scheduled session, exerciseprogression and set habit of an exercise regimen.  LTG following her exercise regimen    Continue Psychosocial Services  Follow up required by staff          Psychosocial Re-Evaluation:   Psychosocial Discharge (Final Psychosocial Re-Evaluation):   Vocational Rehabilitation: Provide vocational rehab assistance to qualifying candidates.   Vocational Rehab Evaluation & Intervention:  Vocational Rehab - 10/25/23 1348       Initial Vocational Rehab Evaluation & Intervention   Assessment shows need for Vocational Rehabilitation No      Vocational Rehab Re-Evaulation   Comments back to work  Education: Education Goals: Education classes will be provided on a variety of topics geared toward better understanding of heart health and risk factor modification. Participant will state understanding/return demonstration of topics presented as  noted by education test scores.  Learning Barriers/Preferences:  Learning Barriers/Preferences - 10/25/23 1348       Learning Barriers/Preferences   Learning Barriers None    Learning Preferences None          General Cardiac Education Topics:  AED/CPR: - Group verbal and written instruction with the use of models to demonstrate the basic use of the AED with the basic ABC's of resuscitation.   Anatomy and Cardiac Procedures: - Group verbal and visual presentation and models provide information about basic cardiac anatomy and function. Reviews the testing methods done to diagnose heart disease and the outcomes of the test results. Describes the treatment choices: Medical Management, Angioplasty, or Coronary Bypass Surgery for treating various heart conditions including Myocardial Infarction, Angina, Valve Disease, and Cardiac Arrhythmias.  Written material given at graduation.   Medication Safety: - Group verbal and visual instruction to review commonly prescribed medications for heart and lung disease. Reviews the medication, class of the drug, and side effects. Includes the steps to properly store meds and maintain the prescription regimen.  Written material given at graduation.   Intimacy: - Group verbal instruction through game format to discuss how heart and lung disease can affect sexual intimacy. Written material given at graduation..   Know Your Numbers and Heart Failure: - Group verbal and visual instruction to discuss disease risk factors for cardiac and pulmonary disease and treatment options.  Reviews associated critical values for Overweight/Obesity, Hypertension, Cholesterol, and Diabetes.  Discusses basics of heart failure: signs/symptoms and treatments.  Introduces Heart Failure Zone chart for action plan for heart failure.  Written material given at graduation.   Infection Prevention: - Provides verbal and written material to individual with discussion of  infection control including proper hand washing and proper equipment cleaning during exercise session. Flowsheet Row Cardiac Rehab from 10/31/2023 in [facility] Cardiac and Pulmonary Rehab  Date 10/31/23  Educator [facility]  Instruction Review Code 1- Verbalizes Understanding    Falls Prevention: - Provides verbal and written material to individual with discussion of falls prevention and safety. Flowsheet Row Cardiac Rehab from 10/31/2023 in [facility] Cardiac and Pulmonary Rehab  Date 10/31/23  Educator [facility]  Instruction Review Code 1- Verbalizes Understanding    Other: -Provides group and verbal instruction on various topics (see comments)   Knowledge Questionnaire Score:  Knowledge Questionnaire Score - 10/31/23 1318       Knowledge Questionnaire Score   Pre Score 25/26          Core Components/Risk Factors/Patient Goals at Admission:  Personal Goals and Risk Factors at Admission - 10/25/23 1348       Core Components/Risk Factors/Patient Goals on Admission    Weight Management Yes;Weight Loss    Intervention Weight Management: Develop a combined nutrition and exercise program designed to reach desired caloric intake, while maintaining appropriate intake of nutrient and fiber, sodium and fats, and appropriate energy expenditure required for the weight goal.;Weight Management: Provide education and appropriate resources to help participant work on and attain dietary goals.    Admit Weight 177 lb (80.3 kg)    Goal Weight: Short Term 176 lb (79.8 kg)    Goal Weight: Long Term 162 lb (73.5 kg)    Expected Outcomes Short Term: Continue to assess and modify interventions until short term  weight is achieved;Long Term: Adherence to nutrition and physical activity/exercise program aimed toward attainment of established weight goal;Weight Loss: Understanding of general recommendations for a balanced deficit meal plan, which promotes 1-2 lb weight loss per week and includes a negative energy balance of  [phone] kcal/d    Lipids Yes    Intervention Provide education and support for participant on nutrition & aerobic/resistive exercise along with prescribed medications to achieve LDL 70mg , HDL >40mg .    Expected Outcomes Short Term: Participant states understanding of desired cholesterol values and is compliant with medications prescribed. Participant is following exercise prescription and nutrition guidelines.;Long Term: Cholesterol controlled with medications as prescribed, with individualized exercise RX and with personalized nutrition plan. Value goals: LDL < 70mg , HDL > 40 mg.          Education:Diabetes - Individual verbal and written instruction to review signs/symptoms of diabetes, desired ranges of glucose level fasting, after meals and with exercise. Acknowledge that pre and post exercise glucose checks will be done for 3 sessions at entry of program.   Core Components/Risk Factors/Patient Goals Review:    Core Components/Risk Factors/Patient Goals at Discharge (Final Review):    ITP Comments:  ITP Comments     Row Name 10/25/23 1427 10/31/23 1203         ITP Comments Virtual orientation call completed today. shehas an appointment on Date: 10/31/2023  for EP eval and gym Orientation.  Documentation of diagnosis can be found in [facility] 10/10/2023 . Completed and gym orientation for cardiac rehab. Initial ITP created and sent for review to Dr. Oneil Pinal, Medical Director.         Comments: Initial ITP

## 2023-11-05 ENCOUNTER — Ambulatory Visit

## 2023-11-12 ENCOUNTER — Encounter: Admitting: *Deleted

## 2023-11-12 DIAGNOSIS — Z955 Presence of coronary angioplasty implant and graft: Secondary | ICD-10-CM | POA: Diagnosis not present

## 2023-11-12 DIAGNOSIS — Z48812 Encounter for surgical aftercare following surgery on the circulatory system: Secondary | ICD-10-CM | POA: Diagnosis not present

## 2023-11-12 NOTE — Progress Notes (Signed)
 Daily Session Note  Patient Details  Name: Pamela Lara MRN: [id number] Date of Birth: March 11, 1961 Referring Provider:   Flowsheet Row Cardiac Rehab from 10/31/2023 in [facility] Cardiac and Pulmonary Rehab  Referring Provider Dr. Deatrice Cage    Encounter Date: 11/12/2023  Check In:  Session Check In - 11/12/23 1601       Check-In   Supervising physician immediately available to respond to emergencies See telemetry face sheet for immediately available ER MD    Location [facility]-Cardiac & Pulmonary Rehab    Staff Present Hoy Rodney RN,BSN;Joseph [facility] RCP,RRT,BSRT;Margaret Best, MS, Exercise Physiologist;Kelly [facility]    Virtual Visit No    Medication changes reported     No    Fall or balance concerns reported    No    Warm-up and Cool-down Performed on first and last piece of equipment    Resistance Training Performed Yes    VAD Patient? No    PAD/SET Patient? No      Pain Assessment   Currently in Pain? No/denies             Social History   Tobacco Use  Smoking Status Former   Current packs/day: 0.00   Average packs/day: 0.3 packs/day for 38.0 years (9.5 ttl pk-yrs)   Types: Cigarettes   Start date: 06/29/1979   Quit date: 06/28/2017   Years since quitting: 6.3  Smokeless Tobacco Never  Tobacco Comments   since age 63/  07-27-2017 per pt quit smoking 02/ 28/ 2019 [place]    Goals Met:  Independence with exercise equipment Exercise tolerated well No report of concerns or symptoms today Strength training completed today  Goals Unmet:  Not Applicable  Comments: First full day of exercise!  Patient was oriented to gym and equipment including functions, settings, policies, and procedures.  Patient's individual exercise prescription and treatment plan were reviewed.  All starting workloads were established based on the results of the 6 minute walk test done at initial orientation visit.  The plan for exercise progression was also introduced and  progression will be customized based on patient's performance and goals.    Dr. Oneil Pinal is Medical Director for [facility] Cardiac Rehabilitation.  Dr. Fuad Aleskerov is Medical Director for [facility] Pulmonary Rehabilitation.

## 2023-11-14 ENCOUNTER — Encounter

## 2023-11-14 DIAGNOSIS — Z955 Presence of coronary angioplasty implant and graft: Secondary | ICD-10-CM | POA: Diagnosis not present

## 2023-11-14 DIAGNOSIS — Z48812 Encounter for surgical aftercare following surgery on the circulatory system: Secondary | ICD-10-CM | POA: Diagnosis not present

## 2023-11-14 NOTE — Progress Notes (Signed)
 Daily Session Note  Patient Details  Name: Pamela Lara MRN: [id number] Date of Birth: [birth year] Referring Provider:   Flowsheet Row Cardiac Rehab from 10/31/2023 in [facility] Cardiac and Pulmonary Rehab  Referring Provider Dr. Deatrice Cage    Encounter Date: 11/14/2023  Check In:  Session Check In - 11/14/23 1101       Check-In   Supervising physician immediately available to respond to emergencies See telemetry face sheet for immediately available ER MD    Location [facility]-Cardiac & Pulmonary Rehab    Staff Present Burnard Davenport RN,BSN,MPA;Maxon Burnell BS, Exercise Physiologist;Joseph [facility] Dyane BS, ACSM CEP, Exercise Physiologist;Jason Elnor RDN,LDN    Virtual Visit No    Medication changes reported     No    Fall or balance concerns reported    No    Tobacco Cessation No Change    Warm-up and Cool-down Performed on first and last piece of equipment    Resistance Training Performed Yes    VAD Patient? No    PAD/SET Patient? No      Pain Assessment   Currently in Pain? No/denies             Social History   Tobacco Use  Smoking Status Former   Current packs/day: 0.00   Average packs/day: 0.3 packs/day for 38.0 years (9.5 ttl pk-yrs)   Types: Cigarettes   Start date: 06/29/1979   Quit date: 06/28/2017   Years since quitting: 6.3  Smokeless Tobacco Never  Tobacco Comments   since age 71/  07-27-2017 per pt quit smoking 02/ 28/ 2019 [place]    Goals Met:  Independence with exercise equipment Exercise tolerated well No report of concerns or symptoms today Strength training completed today  Goals Unmet:  Not Applicable  Comments: Pt able to follow exercise prescription today without complaint.  Will continue to monitor for progression.    Dr. Oneil Pinal is Medical Director for [facility] Cardiac Rehabilitation.  Dr. Fuad Aleskerov is Medical Director for [facility] Pulmonary Rehabilitation.

## 2023-11-15 ENCOUNTER — Encounter: Admitting: *Deleted

## 2023-11-15 DIAGNOSIS — Z955 Presence of coronary angioplasty implant and graft: Secondary | ICD-10-CM

## 2023-11-15 DIAGNOSIS — Z48812 Encounter for surgical aftercare following surgery on the circulatory system: Secondary | ICD-10-CM | POA: Diagnosis not present

## 2023-11-15 NOTE — Progress Notes (Signed)
 Daily Session Note  Patient Details  Name: Pamela Lara MRN: [id number] Date of Birth: 1960-05-26 Referring Provider:   Flowsheet Row Cardiac Rehab from 10/31/2023 in [facility] Cardiac and Pulmonary Rehab  Referring Provider Dr. Deatrice Cage    Encounter Date: 11/15/2023  Check In:  Session Check In - 11/15/23 0953       Check-In   Supervising physician immediately available to respond to emergencies See telemetry face sheet for immediately available ER MD    Location [facility]-Cardiac & Pulmonary Rehab    Staff Present Othel Durand, RN, BSN, CCRP;Maxon Conetta BS, Exercise Physiologist;Jason Elnor RDN,LDN;[facility] [employer]    Virtual Visit No    Medication changes reported     No    Fall or balance concerns reported    No    Warm-up and Cool-down Performed on first and last piece of equipment    Resistance Training Performed Yes    VAD Patient? No    PAD/SET Patient? No      Pain Assessment   Currently in Pain? No/denies             Social History   Tobacco Use  Smoking Status Former   Current packs/day: 0.00   Average packs/day: 0.3 packs/day for 38.0 years (9.5 ttl pk-yrs)   Types: Cigarettes   Start date: 06/29/1979   Quit date: 06/28/2017   Years since quitting: 6.3  Smokeless Tobacco Never  Tobacco Comments   since age 65/  07-27-2017 per pt quit smoking 02/ 28/ 2019 [place]    Goals Met:  Independence with exercise equipment Exercise tolerated well No report of concerns or symptoms today  Goals Unmet:  Not Applicable  Comments: Pt able to follow exercise prescription today without complaint.  Will continue to monitor for progression.    Dr. Oneil Pinal is Medical Director for [facility] Cardiac Rehabilitation.  Dr. Fuad Aleskerov is Medical Director for [facility] Pulmonary Rehabilitation.

## 2023-11-16 ENCOUNTER — Ambulatory Visit

## 2023-11-19 ENCOUNTER — Encounter: Admitting: *Deleted

## 2023-11-19 DIAGNOSIS — Z955 Presence of coronary angioplasty implant and graft: Secondary | ICD-10-CM

## 2023-11-19 DIAGNOSIS — Z48812 Encounter for surgical aftercare following surgery on the circulatory system: Secondary | ICD-10-CM | POA: Diagnosis not present

## 2023-11-19 NOTE — Progress Notes (Signed)
 Daily Session Note  Patient Details  Name: Pamela Lara MRN: [id number] Date of Birth: [birth year] Referring Provider:   Flowsheet Row Cardiac Rehab from 10/31/2023 in [facility] Cardiac and Pulmonary Rehab  Referring Provider Dr. Deatrice Cage    Encounter Date: 11/19/2023  Check In:  Session Check In - 11/19/23 1538       Check-In   Supervising physician immediately available to respond to emergencies See telemetry face sheet for immediately available ER MD    Location [facility]-Cardiac & Pulmonary Rehab    Staff Present Hoy Rodney RN,BSN;[facility] BS, Exercise Physiologist;Margaret Best, MS, Exercise Physiologist    Virtual Visit No    Medication changes reported     No    Fall or balance concerns reported    No    Warm-up and Cool-down Performed on first and last piece of equipment    Resistance Training Performed Yes    VAD Patient? No    PAD/SET Patient? No      Pain Assessment   Currently in Pain? No/denies             Social History   Tobacco Use  Smoking Status Former   Current packs/day: 0.00   Average packs/day: 0.3 packs/day for 38.0 years (9.5 ttl pk-yrs)   Types: Cigarettes   Start date: 06/29/1979   Quit date: 06/28/2017   Years since quitting: 6.3  Smokeless Tobacco Never  Tobacco Comments   since age 46/  07-27-2017 per pt quit smoking 02/ 28/ 2019 cold [place]    Goals Met:  Independence with exercise equipment Exercise tolerated well No report of concerns or symptoms today Strength training completed today  Goals Unmet:  Not Applicable  Comments: Pt able to follow exercise prescription today without complaint.  Will continue to monitor for progression.    Dr. Oneil Pinal is Medical Director for [facility] Cardiac Rehabilitation.  Dr. Fuad Aleskerov is Medical Director for [facility] Pulmonary Rehabilitation.

## 2023-11-20 ENCOUNTER — Other Ambulatory Visit
Admission: RE | Admit: 2023-11-20 | Discharge: 2023-11-20 | Disposition: A | Discharge status: Home / Self Care | Source: Ambulatory Visit | Attending: Nurse Practitioner | Admitting: Nurse Practitioner

## 2023-11-20 ENCOUNTER — Ambulatory Visit: Payer: Self-pay | Admitting: Nurse Practitioner

## 2023-11-20 DIAGNOSIS — E785 Hyperlipidemia, unspecified: Secondary | ICD-10-CM | POA: Insufficient documentation

## 2023-11-20 LAB — LIPID PANEL
Cholesterol: 161 mg/dL (ref 0–200)
HDL: 48 mg/dL (ref >40)
LDL Cholesterol: 78 mg/dL (ref 0–99)
Total CHOL/HDL Ratio: 3.4 ratio
Triglycerides: 175 mg/dL — ABNORMAL HIGH (ref <150)
VLDL: 35 mg/dL (ref 0–40)

## 2023-11-20 LAB — HEPATIC FUNCTION PANEL
ALT: 41 U/L (ref 0–44)
AST: 27 U/L (ref 15–41)
Albumin: 4 g/dL (ref 3.5–5.0)
Alkaline Phosphatase: 60 U/L (ref 38–126)
Bilirubin, Direct: 0.1 mg/dL (ref 0.0–0.2)
Indirect Bilirubin: 1 mg/dL — ABNORMAL HIGH (ref 0.3–0.9)
Total Bilirubin: 1.1 mg/dL (ref 0.0–1.2)
Total Protein: 6.7 g/dL (ref 6.5–8.1)

## 2023-11-21 ENCOUNTER — Encounter

## 2023-11-21 ENCOUNTER — Ambulatory Visit (INDEPENDENT_AMBULATORY_CARE_PROVIDER_SITE_OTHER): Admitting: Internal Medicine

## 2023-11-21 ENCOUNTER — Encounter: Payer: Self-pay | Admitting: Internal Medicine

## 2023-11-21 VITALS — BP 116/70 | HR 75 | Ht 64.0 in | Wt 180.0 lb

## 2023-11-21 DIAGNOSIS — Z124 Encounter for screening for malignant neoplasm of cervix: Secondary | ICD-10-CM

## 2023-11-21 DIAGNOSIS — E041 Nontoxic single thyroid nodule: Secondary | ICD-10-CM

## 2023-11-21 DIAGNOSIS — Z0000 Encounter for general adult medical examination without abnormal findings: Secondary | ICD-10-CM

## 2023-11-21 DIAGNOSIS — Z1231 Encounter for screening mammogram for malignant neoplasm of breast: Secondary | ICD-10-CM

## 2023-11-21 DIAGNOSIS — R7301 Impaired fasting glucose: Secondary | ICD-10-CM

## 2023-11-21 DIAGNOSIS — Z955 Presence of coronary angioplasty implant and graft: Secondary | ICD-10-CM | POA: Diagnosis not present

## 2023-11-21 DIAGNOSIS — Z48812 Encounter for surgical aftercare following surgery on the circulatory system: Secondary | ICD-10-CM | POA: Diagnosis not present

## 2023-11-21 DIAGNOSIS — E781 Pure hyperglyceridemia: Secondary | ICD-10-CM | POA: Diagnosis not present

## 2023-11-21 DIAGNOSIS — I2510 Atherosclerotic heart disease of native coronary artery without angina pectoris: Secondary | ICD-10-CM

## 2023-11-21 NOTE — Assessment & Plan Note (Signed)
 S/P DES OF LAD June 2025.  She is tolerating prasugrel ,  lipitor ,  and asa.  Low dose metoprolol  has been difficult to tolerate due to low BP . Zetia has been added to max dose statin to achieve LDL > 70 and she will follow up with cardiology for repeat labs.   Lab Results  Component Value Date   CHOL 161 11/20/2023   HDL 48 11/20/2023   LDLCALC 78 11/20/2023   LDLDIRECT 136.0 10/05/2022   TRIG 175 (H) 11/20/2023   CHOLHDL 3.4 11/20/2023

## 2023-11-21 NOTE — Progress Notes (Unsigned)
 Patient ID: Pamela Lara, female    DOB: July 06, 1960  Age: 63 y.o. MRN: [id number]  The patient is here for annual preventive examination and management of other chronic and acute problems.   The risk factors are reflected in the social history.   The roster of all physicians providing medical care to patient - is listed in the Snapshot section of the chart.   Activities of daily living:  The patient is 100% independent in all ADLs: dressing, toileting, feeding as well as independent mobility   Home safety : The patient has smoke detectors in the home. They wear seatbelts.  There are no unsecured firearms at home. There is no violence in the home.    There is no risks for hepatitis, STDs or HIV. There is no   history of blood transfusion. They have no travel history to infectious disease endemic areas of the world.   The patient has seen their dentist in the last six month. They have seen their eye doctor in the last year. The patinet  denies slight hearing difficulty with regard to whispered voices and some television programs.  They have deferred audiologic testing in the last year.  They do not  have excessive sun exposure. Discussed the need for sun protection: hats, long sleeves and use of sunscreen if there is significant sun exposure.    Diet: the importance of a healthy diet is discussed. They do have a healthy diet.   The benefits of regular aerobic exercise were discussed. The patient  exercises  3 to 5 days per week  for  60 minutes.    Depression screen: there are no signs or vegative symptoms of depression- irritability, change in appetite, anhedonia, sadness/tearfullness.   The following portions of the patient's history were reviewed and updated as appropriate: allergies, current medications, past family history, past medical history,  past surgical history, past social history  and problem list.   Visual acuity was not assessed per patient preference since the patient has  regular follow up with an  ophthalmologist. Hearing and body mass index were assessed and reviewed.    During the course of the visit the patient was educated and counseled about appropriate screening and preventive services including : fall prevention , diabetes screening, nutrition counseling, colorectal cancer screening, and recommended immunizations.    Chief Complaint:  Since her last visit Alexyss was diagnosed with severe proximal LAD stenosis and underwent successful placement of DES.  She had presented to ER on  with  [place] ,  presenting with right neck and shoulder pain ( NO CHEST PAIN) .  CTA ruled out PE and she was discharged home with cardiology follow up. Noninvasive imaging suggested severe disease.  On June 12 she underwent Cardiac cath with placement of DES in LAD  CRF:  HISTORY OF 40 YRS OF LIGHT TOBACCO ABUSE (1/4 PACK daily ) ,    Review of Symptoms  Patient denies headache, fevers, malaise, unintentional weight loss, skin rash, eye pain, sinus congestion and sinus pain, sore throat, dysphagia,  hemoptysis , cough, dyspnea, wheezing, chest pain, palpitations, orthopnea, edema, abdominal pain, nausea, melena, diarrhea, constipation, flank pain, dysuria, hematuria, urinary  Frequency, nocturia, numbness, tingling, seizures,  Focal weakness, Loss of consciousness,  Tremor, insomnia, depression, anxiety, and suicidal ideation.    Physical Exam:  BP 116/70   Pulse 75   Ht 5' 4 (1.626 m)   Wt 180 lb (81.6 kg)   LMP 10/03/2015 (Approximate)   SpO2 97%  BMI 30.90 kg/m    Physical Exam Vitals reviewed.  Constitutional:      General: She is not in acute distress.    Appearance: Normal appearance. She is well-developed and normal weight. She is not ill-appearing, toxic-appearing or diaphoretic.  HENT:     Head: Normocephalic.     Right Ear: Tympanic membrane, ear canal and external ear normal. There is no impacted cerumen.     Left Ear: Tympanic membrane, ear canal and  external ear normal. There is no impacted cerumen.     Nose: Nose normal.     Mouth/Throat:     Mouth: Mucous membranes are moist.     Pharynx: Oropharynx is clear.  Eyes:     General: No scleral icterus.       Right eye: No discharge.        Left eye: No discharge.     Conjunctiva/sclera: Conjunctivae normal.     Pupils: Pupils are equal, round, and reactive to light.  Neck:     Thyroid : No thyromegaly.     Vascular: No carotid bruit or JVD.  Cardiovascular:     Rate and Rhythm: Normal rate and regular rhythm.     Heart sounds: Normal heart sounds.  Pulmonary:     Effort: Pulmonary effort is normal. No respiratory distress.     Breath sounds: Normal breath sounds.  Chest:  Breasts:    Breasts are symmetrical.     Right: Normal. No swelling, inverted nipple, mass, nipple discharge, skin change or tenderness.     Left: Normal. No swelling, inverted nipple, mass, nipple discharge, skin change or tenderness.  Abdominal:     General: Bowel sounds are normal.     Palpations: Abdomen is soft. There is no mass.     Tenderness: There is no abdominal tenderness. There is no guarding or rebound.  Musculoskeletal:        General: Normal range of motion.     Cervical back: Normal range of motion and neck supple.  Lymphadenopathy:     Cervical: No cervical adenopathy.     Upper Body:     Right upper body: No supraclavicular, axillary or pectoral adenopathy.     Left upper body: No supraclavicular, axillary or pectoral adenopathy.  Skin:    General: Skin is warm and dry.  Neurological:     General: No focal deficit present.     Mental Status: She is alert and oriented to person, place, and time. Mental status is at baseline.  Psychiatric:        Mood and Affect: Mood normal.        Behavior: Behavior normal.        Thought Content: Thought content normal.        Judgment: Judgment normal.     Assessment and Plan: Encounter for screening mammogram for malignant neoplasm of  breast -     3D Screening Mammogram, Left and Right; Future  Cervical cancer screening  Left thyroid  nodule  Impaired fasting glucose  Atherosclerosis of native coronary artery of native heart without angina pectoris Assessment & Plan: S/P DES OF LAD June 2025.  She is tolerating prasugrel ,  lipitor ,  and asa.  Low dose metoprolol  has been difficult to tolerate due to low BP . Zetia has been added to max dose statin to achieve LDL > 70 and she will follow up with cardiology for repeat labs.   Lab Results  Component Value Date   CHOL 161 11/20/2023  HDL 48 11/20/2023   LDLCALC 78 11/20/2023   LDLDIRECT 136.0 10/05/2022   TRIG 175 (H) 11/20/2023   CHOLHDL 3.4 11/20/2023      Hypertriglyceridemia without hypercholesterolemia Assessment & Plan: Improved with addition of statin.   Lab Results  Component Value Date   CHOL 161 11/20/2023   HDL 48 11/20/2023   LDLCALC 78 11/20/2023   LDLDIRECT 136.0 10/05/2022   TRIG 175 (H) 11/20/2023   CHOLHDL 3.4 11/20/2023      Encounter for preventive health examination Assessment & Plan: age appropriate education and counseling updated, referrals for preventative services and immunizations addressed, dietary and smoking counseling addressed, most recent labs reviewed.  I have personally reviewed and have noted:   1) the patient's medical and social history 2) The pt's use of alcohol, tobacco, and illicit drugs 3) The patient's current medications and supplements 4) Functional ability including ADL's, fall risk, home safety risk, hearing and visual impairment 5) Diet and physical activities 6) Evidence for depression or mood disorder 7) The patient's height, weight, and BMI have been recorded in the chart   I have made referrals, and provided counseling and education based on review of the above      Return in about 1 year (around 11/20/2024).  Verneita LITTIE Kettering, MD

## 2023-11-21 NOTE — Patient Instructions (Signed)
 WE WILL POSTPONE YOUR PAP SMEAR UNTIL 2026 WHEN YOU ARE OFF THE PRASUGREL   YOUR MAMMOGRAM  IS OVERDUE AND has been ordered , PLEASE CALL AND GET THIS SCHEDULED! [facility] - call [phone]  Veronda does  the scheduling for [facility] as well

## 2023-11-21 NOTE — Progress Notes (Signed)
 Daily Session Note  Patient Details  Name: Pamela Lara MRN: [id number] Date of Birth: October 13, 1960 Referring Provider:   Flowsheet Row Cardiac Rehab from 10/31/2023 in [facility] Cardiac and Pulmonary Rehab  Referring Provider Dr. Deatrice Cage    Encounter Date: 11/21/2023  Check In:  Session Check In - 11/21/23 1034       Check-In   Supervising physician immediately available to respond to emergencies See telemetry face sheet for immediately available ER MD    Location [facility]-Cardiac & Pulmonary Rehab    Staff Present Burnard Davenport RN,BSN,MPA;Joseph [facility] RCP,RRT,BSRT;Margaret Best, MS, Exercise Physiologist;Jason Elnor RDN,LDN;Noah Tickle, BS, Exercise Physiologist    Virtual Visit No    Medication changes reported     No    Fall or balance concerns reported    No    Tobacco Cessation No Change    Warm-up and Cool-down Performed on first and last piece of equipment    Resistance Training Performed Yes    VAD Patient? No    PAD/SET Patient? No      Pain Assessment   Currently in Pain? No/denies             Social History   Tobacco Use  Smoking Status Former   Current packs/day: 0.00   Average packs/day: 0.3 packs/day for 38.0 years (9.5 ttl pk-yrs)   Types: Cigarettes   Start date: 06/29/1979   Quit date: 06/28/2017   Years since quitting: 6.4  Smokeless Tobacco Never  Tobacco Comments   since age 32/  07-27-2017 per pt quit smoking 02/ 28/ 2019 [place]    Goals Met:  Independence with exercise equipment Exercise tolerated well No report of concerns or symptoms today Strength training completed today  Goals Unmet:  Not Applicable  Comments: Pt able to follow exercise prescription today without complaint.  Will continue to monitor for progression.    Dr. Oneil Pinal is Medical Director for [facility] Cardiac Rehabilitation.  Dr. Fuad Aleskerov is Medical Director for [facility] Pulmonary Rehabilitation.

## 2023-11-22 ENCOUNTER — Encounter

## 2023-11-22 DIAGNOSIS — Z48812 Encounter for surgical aftercare following surgery on the circulatory system: Secondary | ICD-10-CM | POA: Diagnosis not present

## 2023-11-22 DIAGNOSIS — Z955 Presence of coronary angioplasty implant and graft: Secondary | ICD-10-CM

## 2023-11-22 NOTE — Assessment & Plan Note (Signed)

## 2023-11-22 NOTE — Assessment & Plan Note (Signed)
 Improved with addition of statin.   Lab Results  Component Value Date   CHOL 161 11/20/2023   HDL 48 11/20/2023   LDLCALC 78 11/20/2023   LDLDIRECT 136.0 10/05/2022   TRIG 175 (H) 11/20/2023   CHOLHDL 3.4 11/20/2023

## 2023-11-22 NOTE — Progress Notes (Signed)
 Daily Session Note  Patient Details  Name: Pamela Lara MRN: [id number] Date of Birth: [birth year] Referring Provider:   Flowsheet Row Cardiac Rehab from 10/31/2023 in [facility] Cardiac and Pulmonary Rehab  Referring Provider Dr. Deatrice Cage    Encounter Date: 11/22/2023  Check In:  Session Check In - 11/22/23 0926       Check-In   Supervising physician immediately available to respond to emergencies See telemetry face sheet for immediately available ER MD    Location [facility]-Cardiac & Pulmonary Rehab    Staff Present Burnard Davenport RN,BSN,MPA;Maxon Conetta BS, Exercise Physiologist;Kristen Coble RN,BC,MSN;Margaret Best, MS, Exercise Physiologist    Virtual Visit No    Medication changes reported     No    Fall or balance concerns reported    No    Tobacco Cessation No Change    Warm-up and Cool-down Performed on first and last piece of equipment    Resistance Training Performed Yes    VAD Patient? No    PAD/SET Patient? No      Pain Assessment   Currently in Pain? No/denies             Social History   Tobacco Use  Smoking Status Former   Current packs/day: 0.00   Average packs/day: 0.3 packs/day for 38.0 years (9.5 ttl pk-yrs)   Types: Cigarettes   Start date: 06/29/1979   Quit date: 06/28/2017   Years since quitting: 6.4  Smokeless Tobacco Never  Tobacco Comments   since age 69/  07-27-2017 per pt quit smoking 02/ 28/ 2019 cold [place]    Goals Met:  Independence with exercise equipment Exercise tolerated well No report of concerns or symptoms today Strength training completed today  Goals Unmet:  Not Applicable  Comments: Pt able to follow exercise prescription today without complaint.  Will continue to monitor for progression.    Dr. Oneil Pinal is Medical Director for [facility] Cardiac Rehabilitation.  Dr. Fuad Aleskerov is Medical Director for [facility] Pulmonary Rehabilitation.

## 2023-11-26 ENCOUNTER — Ambulatory Visit

## 2023-11-28 ENCOUNTER — Encounter: Admitting: *Deleted

## 2023-11-28 DIAGNOSIS — Z955 Presence of coronary angioplasty implant and graft: Secondary | ICD-10-CM | POA: Diagnosis not present

## 2023-11-28 DIAGNOSIS — Z48812 Encounter for surgical aftercare following surgery on the circulatory system: Secondary | ICD-10-CM | POA: Diagnosis not present

## 2023-11-28 NOTE — Progress Notes (Signed)
 Daily Session Note  Patient Details  Name: Pamela Lara MRN: [id number] Date of Birth: 1961-03-10 Referring Provider:   Flowsheet Row Cardiac Rehab from 10/31/2023 in [facility] Cardiac and Pulmonary Rehab  Referring Provider Dr. Deatrice Cage    Encounter Date: 11/28/2023  Check In:  Session Check In - 11/28/23 1434       Check-In   Supervising physician immediately available to respond to emergencies See telemetry face sheet for immediately available ER MD    Location [facility]-Cardiac & Pulmonary Rehab    Staff Present Hoy Rodney RN,BSN;Kelly Dyane BS, ACSM CEP, Exercise Physiologist;Noah Tickle, BS, Exercise Physiologist;Kelly [facility]    Virtual Visit No    Medication changes reported     No    Fall or balance concerns reported    No    Warm-up and Cool-down Performed on first and last piece of equipment    Resistance Training Performed Yes    VAD Patient? No    PAD/SET Patient? No      Pain Assessment   Currently in Pain? No/denies             Social History   Tobacco Use  Smoking Status Former   Current packs/day: 0.00   Average packs/day: 0.3 packs/day for 38.0 years (9.5 ttl pk-yrs)   Types: Cigarettes   Start date: 06/29/1979   Quit date: 06/28/2017   Years since quitting: 6.4  Smokeless Tobacco Never  Tobacco Comments   since age 34/  07-27-2017 per pt quit smoking 02/ 28/ 2019 [place]    Goals Met:  Independence with exercise equipment Exercise tolerated well No report of concerns or symptoms today Strength training completed today  Goals Unmet:  Not Applicable  Comments: Pt able to follow exercise prescription today without complaint.  Will continue to monitor for progression.    Dr. Oneil Pinal is Medical Director for [facility] Cardiac Rehabilitation.  Dr. Fuad Aleskerov is Medical Director for [facility] Pulmonary Rehabilitation.

## 2023-11-28 NOTE — Progress Notes (Signed)
 Cardiac Individual Treatment Plan  Patient Details  Name: Pamela Lara MRN: [id number] Date of Birth: Aug 17, 1960 Referring Provider:   Flowsheet Row Cardiac Rehab from 10/31/2023 in [facility] Cardiac and Pulmonary Rehab  Referring Provider Dr. Deatrice Cage    Initial Encounter Date:  Flowsheet Row Cardiac Rehab from 10/31/2023 in [facility] Cardiac and Pulmonary Rehab  Date 10/31/23    Visit Diagnosis: Status post coronary artery stent placement  Patient's Home Medications on Admission:  Current Outpatient Medications:    acetaminophen  (TYLENOL ) 500 MG tablet, Take 1,000 mg by mouth every 6 (six) hours as needed for moderate pain (pain score 4-6) or mild pain (pain score 1-3)., Disp: , Rfl:    aspirin  EC 81 MG tablet, Take 81 mg by mouth daily. Swallow whole., Disp: , Rfl:    atorvastatin  (LIPITOR) 80 MG tablet, Take 1 tablet (80 mg total) by mouth daily., Disp: 90 tablet, Rfl: 3   calcium  carbonate (TUMS - DOSED IN MG ELEMENTAL CALCIUM ) 500 MG chewable tablet, Chew 1 tablet by mouth as needed for indigestion or heartburn., Disp: , Rfl:    Cholecalciferol  (VITAMIN D3) 1000 UNITS CAPS, Take 1,000 Units by mouth daily., Disp: , Rfl:    influenza vac split quadrivalent PF (FLUARIX  QUADRIVALENT) 0.5 ML injection, Inject into the muscle., Disp: 0.5 mL, Rfl: 0   [place] Ginseng 1000 MG TABS, Take 1,000 mg by mouth daily. , Disp: , Rfl:    metoprolol  succinate (TOPROL -XL) 12.5 mg TB24 24 hr tablet, Take 12.5 mg by mouth daily., Disp: , Rfl:    Multiple Vitamin (MULTIVITAMIN) tablet, Take 1 tablet by mouth daily., Disp: , Rfl:    nitroGLYCERIN  (NITROSTAT ) 0.4 MG SL tablet, Place 1 tablet (0.4 mg total) under the tongue every 5 (five) minutes as needed for chest pain., Disp: 25 tablet, Rfl: 3   Omega-3 Fatty Acids (FISH OIL) 1000 MG CAPS, Take 1,800 mg by mouth daily. 900 mg each, Disp: , Rfl:    omeprazole  (PRILOSEC) 20 MG capsule, Take 20 mg by mouth daily. (Patient taking differently: Take 20 mg by  mouth daily. Taking as needed), Disp: , Rfl:    prasugrel  (EFFIENT ) 10 MG TABS tablet, Take 1 tablet (10 mg total) by mouth daily., Disp: 90 tablet, Rfl: 3   Psyllium (METAMUCIL FIBER PO), Take 15 mLs by mouth every evening., Disp: , Rfl:   Past Medical History: Past Medical History:  Diagnosis Date   Arthritis    right shoulder   BPPV (benign paroxysmal positional vertigo)    CAD (coronary artery disease)    a. 09/2023 Cor CTA: Sev prox/mid LAD dzs w/ FFRct 0.64; b. 09/2023 PCI: LM 99p/m (3.5x20 Synergy DES), 51m, D1 99 (jailed-->DCBA), RI nl, LCX nl, RCA nl, EF 55-65%.   Congenital bowed legs    s/p  surgical correction   Diverticulitis, colon    recurrent   Dysplastic nevus 09/09/2018   R mid post thigh   Dysplastic nevus 07/09/2019   R buttock,moderate atypia   Dysplastic nevus 07/09/2019   L buttock, moderate atypia   Dysplastic nevus 07/09/2019   R lat popliteal, moderate atypia   Ectopic pregnancy 01/29/2000   GERD (gastroesophageal reflux disease)    watch diet (as needed takes apple cider vinager)   History of colonic diverticulitis    hx multiple acute diverticulitis episodes treated medically   History of ectopic pregnancy    History of Helicobacter pylori infection 2013  approx.   Melanoma (HCC) 04/19/2018   R prox bicep (  MMIS)   PONV (postoperative nausea and vomiting)    Seasonal affective disorder (HCC)     Tobacco Use: Social History   Tobacco Use  Smoking Status Former   Current packs/day: 0.00   Average packs/day: 0.3 packs/day for 38.0 years (9.5 ttl pk-yrs)   Types: Cigarettes   Start date: 06/29/1979   Quit date: 06/28/2017   Years since quitting: 6.4  Smokeless Tobacco Never  Tobacco Comments   since age 31/  07-27-2017 per pt quit smoking 02/ 28/ 2019 cold [place]    Labs: Review Flowsheet  More data exists      Latest Ref Rng & Units 09/20/2020 09/21/2020 09/29/2021 10/05/2022 11/20/2023  Labs for ITP Cardiac and Pulmonary Rehab  Cholestrol 0 -  200 mg/dL 777  - 776  804  838   LDL (calc) 0 - 99 mg/dL 885  - - 894  78   Direct LDL mg/dL - - 860.9  863.9  -  HDL-C >40 mg/dL 51  - 43.99  45.19  48   Trlycerides <150 mg/dL 714  - 751.9  821.9  824   Hemoglobin A1c 4.6 - 6.5 % - 5.3  5.4  5.4  -     Exercise Target Goals: Exercise Program Goal: Individual exercise prescription set using results from initial 6 min walk test and THRR while considering  patient's activity barriers and safety.   Exercise Prescription Goal: Initial exercise prescription builds to 30-45 minutes a day of aerobic activity, 2-3 days per week.  Home exercise guidelines will be given to patient during program as part of exercise prescription that the participant will acknowledge.   Education: Aerobic Exercise: - Group verbal and visual presentation on the components of exercise prescription. Introduces F.I.T.T principle from ACSM for exercise prescriptions.  Reviews F.I.T.T. principles of aerobic exercise including progression. Written material given at graduation.   Education: Resistance Exercise: - Group verbal and visual presentation on the components of exercise prescription. Introduces F.I.T.T principle from ACSM for exercise prescriptions  Reviews F.I.T.T. principles of resistance exercise including progression. Written material given at graduation.    Education: Exercise & Equipment Safety: - Individual verbal instruction and demonstration of equipment use and safety with use of the equipment. Flowsheet Row Cardiac Rehab from 11/21/2023 in [facility] Cardiac and Pulmonary Rehab  Date 10/31/23  Educator [facility]  Instruction Review Code 1- Verbalizes Understanding    Education: Exercise Physiology & General Exercise Guidelines: - Group verbal and written instruction with models to review the exercise physiology of the cardiovascular system and associated critical values. Provides general exercise guidelines with specific guidelines to those with heart or lung  disease.  Flowsheet Row Cardiac Rehab from 11/21/2023 in [facility] Cardiac and Pulmonary Rehab  Date 11/21/23  Educator nt  Instruction Review Code 1- TEFL teacher Understanding    Education: Flexibility, Balance, Mind/Body Relaxation: - Group verbal and visual presentation with interactive activity on the components of exercise prescription. Introduces F.I.T.T principle from ACSM for exercise prescriptions. Reviews F.I.T.T. principles of flexibility and balance exercise training including progression. Also discusses the mind body connection.  Reviews various relaxation techniques to help reduce and manage stress (i.e. Deep breathing, progressive muscle relaxation, and visualization). Balance handout provided to take home. Written material given at graduation.   Activity Barriers & Risk Stratification:  Activity Barriers & Cardiac Risk Stratification - 10/31/23 1205       Activity Barriers & Cardiac Risk Stratification   Activity Barriers None    Cardiac Risk Stratification Moderate  6 Minute Walk:  6 Minute Walk     Row Name 10/31/23 1204         6 Minute Walk   Phase Initial     Distance 1500 feet     Walk Time 6 minutes     # of Rest Breaks 0     MPH 2.84     METS 3.32     RPE 9     Perceived Dyspnea  0     VO2 Peak 11.6     Symptoms No     Resting HR 66 bpm     Resting BP 112/64     Resting Oxygen Saturation  97 %     Exercise Oxygen Saturation  during 6 min walk 99 %     Max Ex. HR 91 bpm     Max Ex. BP 116/62     2 Minute Post BP 114/62        Oxygen Initial Assessment:   Oxygen Re-Evaluation:   Oxygen Discharge (Final Oxygen Re-Evaluation):   Initial Exercise Prescription:  Initial Exercise Prescription - 10/31/23 1200       Date of Initial Exercise RX and Referring Provider   Date 10/31/23    Referring Provider Dr. Deatrice Cage      Oxygen   Maintain Oxygen Saturation 88% or higher      Treadmill   MPH 2.8    Grade 1    Minutes 15     METs 3.53      Elliptical   Level 1    Speed 3    Minutes 15    METs 3.32      Rower   Level 3    Minutes 15    METs 3.32      Intensity   THRR 40-80% of Max Heartrate 102-139    Ratings of Perceived Exertion 11-13    Perceived Dyspnea 0-4      Progression   Progression Continue to progress workloads to maintain intensity without signs/symptoms of physical distress.      Resistance Training   Training Prescription Yes    Weight 5lb    Reps 10-15          Perform Capillary Blood Glucose checks as needed.  Exercise Prescription Changes:   Exercise Prescription Changes     Row Name 10/31/23 1200 11/21/23 1400           Response to Exercise   Blood Pressure (Admit) 112/64 102/60      Blood Pressure (Exercise) 116/62 124/62      Blood Pressure (Exit) 114/62 112/64      Heart Rate (Admit) 66 bpm 88 bpm      Heart Rate (Exercise) 91 bpm 120 bpm      Heart Rate (Exit) 74 bpm 83 bpm      Oxygen Saturation (Admit) 97 % --      Oxygen Saturation (Exercise) 99 % --      Oxygen Saturation (Exit) 99 % --      Rating of Perceived Exertion (Exercise) 9 15      Perceived Dyspnea (Exercise) 0 --      Symptoms results none      Comments -- 1st week of exercise sessions      Duration -- Progress to 30 minutes of  aerobic without signs/symptoms of physical distress      Intensity -- THRR unchanged        Progression   Progression --  Continue to progress workloads to maintain intensity without signs/symptoms of physical distress.      Average METs -- 3.89        Resistance Training   Training Prescription -- Yes      Weight -- 5lb      Reps -- 10-15        Interval Training   Interval Training -- No        Treadmill   MPH -- 2.8      Grade -- 2.5      Minutes -- 15      METs -- 4.11        Elliptical   Level -- 3      Speed -- 5      Minutes -- 15      METs -- 5.1        REL-XR   Level -- 4      Minutes -- 15      METs -- 3.2        Oxygen    Maintain Oxygen Saturation -- 88% or higher         Exercise Comments:   Exercise Comments     Row Name 11/12/23 1602           Exercise Comments First full day of exercise!  Patient was oriented to gym and equipment including functions, settings, policies, and procedures.  Patient's individual exercise prescription and treatment plan were reviewed.  All starting workloads were established based on the results of the 6 minute walk test done at initial orientation visit.  The plan for exercise progression was also introduced and progression will be customized based on patient's performance and goals.          Exercise Goals and Review:   Exercise Goals     Row Name 10/31/23 1315             Exercise Goals   Increase Physical Activity Yes       Intervention Provide advice, education, support and counseling about physical activity/exercise needs.;Develop an individualized exercise prescription for aerobic and resistive training based on initial evaluation findings, risk stratification, comorbidities and participant's personal goals.       Expected Outcomes Short Term: Attend rehab on a regular basis to increase amount of physical activity.;Long Term: Add in home exercise to make exercise part of routine and to increase amount of physical activity.;Long Term: Exercising regularly at least 3-5 days a week.       Increase Strength and Stamina Yes       Intervention Provide advice, education, support and counseling about physical activity/exercise needs.;Develop an individualized exercise prescription for aerobic and resistive training based on initial evaluation findings, risk stratification, comorbidities and participant's personal goals.       Expected Outcomes Short Term: Increase workloads from initial exercise prescription for resistance, speed, and METs.;Short Term: Perform resistance training exercises routinely during rehab and add in resistance training at home;Long Term: Improve  cardiorespiratory fitness, muscular endurance and strength as measured by increased METs and functional capacity ( )       Able to understand and use rate of perceived exertion (RPE) scale Yes       Intervention Provide education and explanation on how to use RPE scale       Expected Outcomes Short Term: Able to use RPE daily in rehab to express subjective intensity level;Long Term:  Able to use RPE to guide intensity level when exercising independently  Able to understand and use Dyspnea scale Yes       Intervention Provide education and explanation on how to use Dyspnea scale       Expected Outcomes Long Term: Able to use Dyspnea scale to guide intensity level when exercising independently;Short Term: Able to use Dyspnea scale daily in rehab to express subjective sense of shortness of breath during exertion       Knowledge and understanding of Target Heart Rate Range (THRR) Yes       Intervention Provide education and explanation of THRR including how the numbers were predicted and where they are located for reference       Expected Outcomes Short Term: Able to use daily as guideline for intensity in rehab;Short Term: Able to state/look up THRR;Long Term: Able to use THRR to govern intensity when exercising independently       Able to check pulse independently Yes       Intervention Provide education and demonstration on how to check pulse in carotid and radial arteries.;Review the importance of being able to check your own pulse for safety during independent exercise       Expected Outcomes Short Term: Able to explain why pulse checking is important during independent exercise;Long Term: Able to check pulse independently and accurately       Understanding of Exercise Prescription Yes       Intervention Provide education, explanation, and written materials on patient's individual exercise prescription       Expected Outcomes Long Term: Able to explain home exercise prescription to exercise  independently;Short Term: Able to explain program exercise prescription          Exercise Goals Re-Evaluation :  Exercise Goals Re-Evaluation     Row Name 11/12/23 1603 11/21/23 1415           Exercise Goal Re-Evaluation   Exercise Goals Review Increase Physical Activity;Knowledge and understanding of Target Heart Rate Range (THRR);Able to understand and use rate of perceived exertion (RPE) scale;Understanding of Exercise Prescription;Increase Strength and Stamina;Able to check pulse independently Increase Physical Activity;Understanding of Exercise Prescription;Increase Strength and Stamina      Comments Reviewed RPE and dyspnea scale, THR and program prescription with pt today.  Pt voiced understanding and was given a copy of goals to take home. Ronnett is off to a good start in the program and completed her first week in this review. She tolerated her exercise prescription well. She increased her workload on the treadmill already to a speed of 2.8 mph and incline of 2.5%. She also already increased to level 3 on the elliptical. She added the XR to her prescription and worked at level 4. We will continue to monitor her progress in the program.      Expected Outcomes Short: Use RPE daily to regulate intensity.  Long: Follow program prescription in THR. Short: Continue to follow current exercise prescription. Long: Continue exercise to improve strength and stamina.         Discharge Exercise Prescription (Final Exercise Prescription Changes):  Exercise Prescription Changes - 11/21/23 1400       Response to Exercise   Blood Pressure (Admit) 102/60    Blood Pressure (Exercise) 124/62    Blood Pressure (Exit) 112/64    Heart Rate (Admit) 88 bpm    Heart Rate (Exercise) 120 bpm    Heart Rate (Exit) 83 bpm    Rating of Perceived Exertion (Exercise) 15    Symptoms none    Comments 1st week  of exercise sessions    Duration Progress to 30 minutes of  aerobic without signs/symptoms of physical  distress    Intensity THRR unchanged      Progression   Progression Continue to progress workloads to maintain intensity without signs/symptoms of physical distress.    Average METs 3.89      Resistance Training   Training Prescription Yes    Weight 5lb    Reps 10-15      Interval Training   Interval Training No      Treadmill   MPH 2.8    Grade 2.5    Minutes 15    METs 4.11      Elliptical   Level 3    Speed 5    Minutes 15    METs 5.1      REL-XR   Level 4    Minutes 15    METs 3.2      Oxygen   Maintain Oxygen Saturation 88% or higher          Nutrition:  Target Goals: Understanding of nutrition guidelines, daily intake of sodium 1500mg , cholesterol 200mg , calories 30% from fat and 7% or less from saturated fats, daily to have 5 or more servings of fruits and vegetables.  Education: All About Nutrition: -Group instruction provided by verbal, written material, interactive activities, discussions, models, and posters to present general guidelines for heart healthy nutrition including fat, fiber, MyPlate, the role of sodium in heart healthy nutrition, utilization of the nutrition label, and utilization of this knowledge for meal planning. Follow up email sent as well. Written material given at graduation. Flowsheet Row Cardiac Rehab from 11/21/2023 in [facility] Cardiac and Pulmonary Rehab  Education need identified 10/31/23    Biometrics:  Pre Biometrics - 10/31/23 1316       Pre Biometrics   Height 5' 4.7 (1.643 m)    Weight 178 lb 8 oz (81 kg)    Waist Circumference 36 inches    Hip Circumference 40 inches    Waist to Hip Ratio 0.9 %    BMI (Calculated) 29.99    Single Leg Stand 30 seconds           Nutrition Therapy Plan and Nutrition Goals:   Nutrition Assessments:  MEDIFICTS Score Key: >=70 Need to make dietary changes  40-70 Heart Healthy Diet <= 40 Therapeutic Level Cholesterol Diet  Flowsheet Row Cardiac Rehab from 10/31/2023 in [facility]  Cardiac and Pulmonary Rehab  Picture Your Plate Total Score on Admission 63   Picture Your Plate Scores: <59 Unhealthy dietary pattern with much room for improvement. 41-50 Dietary pattern unlikely to meet recommendations for good health and room for improvement. 51-60 More healthful dietary pattern, with some room for improvement.  >60 Healthy dietary pattern, although there may be some specific behaviors that could be improved.    Nutrition Goals Re-Evaluation:   Nutrition Goals Discharge (Final Nutrition Goals Re-Evaluation):   Psychosocial: Target Goals: Acknowledge presence or absence of significant depression and/or stress, maximize coping skills, provide positive support system. Participant is able to verbalize types and ability to use techniques and skills needed for reducing stress and depression.   Education: Stress, Anxiety, and Depression - Group verbal and visual presentation to define topics covered.  Reviews how body is impacted by stress, anxiety, and depression.  Also discusses healthy ways to reduce stress and to treat/manage anxiety and depression.  Written material given at graduation. Flowsheet Row Cardiac Rehab from 11/21/2023 in [facility] Cardiac and  Pulmonary Rehab  Date 11/14/23  Educator sb  Instruction Review Code 1- [employer] Understanding    Education: Sleep Hygiene -Provides group verbal and written instruction about how sleep can affect your health.  Define sleep hygiene, discuss sleep cycles and impact of sleep habits. Review good sleep hygiene tips.    Initial Review & Psychosocial Screening:  Initial Psych Review & Screening - 10/25/23 1342       Initial Review   Current issues with None Identified      Family Dynamics   Good Support System? Yes   husband children     Barriers   Psychosocial barriers to participate in program There are no identifiable barriers or psychosocial needs.      Screening Interventions   Interventions To provide  support and resources with identified psychosocial needs;Provide feedback about the scores to participant    Expected Outcomes Short Term goal: Utilizing psychosocial counselor, staff and physician to assist with identification of specific Stressors or current issues interfering with healing process. Setting desired goal for each stressor or current issue identified.;Long Term Goal: Stressors or current issues are controlled or eliminated.;Short Term goal: Identification and review with participant of any Quality of Life or Depression concerns found by scoring the questionnaire.;Long Term goal: The participant improves quality of Life and PHQ9 Scores as seen by post scores and/or verbalization of changes          Quality of Life Scores:   Quality of Life - 10/31/23 1316       Quality of Life   Select Quality of Life      Quality of Life Scores   Health/Function Pre 23.17 %    Socioeconomic Pre 28.13 %    Psych/Spiritual Pre 26.5 %    Family Pre 30 %    GLOBAL Pre 25.94 %         Scores of 19 and below usually indicate a poorer quality of life in these areas.  A difference of  2-3 points is a clinically meaningful difference.  A difference of 2-3 points in the total score of the Quality of Life Index has been associated with significant improvement in overall quality of life, self-image, physical symptoms, and general health in studies assessing change in quality of life.  PHQ-9: Review Flowsheet  More data exists      11/21/2023 10/31/2023 10/05/2022 09/29/2021 09/21/2020  Depression screen PHQ 2/9  Decreased Interest 0 0 0 0 0  Down, Depressed, Hopeless 0 0 0 0 0  PHQ - 2 Score 0 0 0 0 0  Altered sleeping 0 0 0 - 0  Tired, decreased energy 0 2 3 - 0  Change in appetite 0 0 0 - 0  Feeling bad or failure about yourself  0 0 0 - 0  Trouble concentrating 0 0 0 - 0  Moving slowly or fidgety/restless 0 0 0 - 0  Suicidal thoughts 0 0 0 - 0  PHQ-9 Score 0 2 3 - 0  Difficult doing  work/chores Not difficult at all Somewhat difficult Not difficult at all - Not difficult at all   Interpretation of Total Score  Total Score Depression Severity:  1-4 = Minimal depression, 5-9 = Mild depression, 10-14 = Moderate depression, 15-19 = Moderately severe depression, 20-27 = Severe depression   Psychosocial Evaluation and Intervention:  Psychosocial Evaluation - 10/25/23 1513       Psychosocial Evaluation & Interventions   Interventions Encouraged to exercise with the program and follow exercise  prescription    Comments There are no barriers to attending the program.  She has support from her husband and children.  She was walking 4 minles a week and swimming some prior to the stent.   She walks 10,000 steps a day during wrok three days a week.  She is ready to get started    Expected Outcomes STG attend all scheduled session, exerciseprogression and set habit of an exercise regimen.  LTG following her exercise regimen    Continue Psychosocial Services  Follow up required by staff          Psychosocial Re-Evaluation:   Psychosocial Discharge (Final Psychosocial Re-Evaluation):   Vocational Rehabilitation: Provide vocational rehab assistance to qualifying candidates.   Vocational Rehab Evaluation & Intervention:  Vocational Rehab - 10/25/23 1348       Initial Vocational Rehab Evaluation & Intervention   Assessment shows need for Vocational Rehabilitation No      Vocational Rehab Re-Evaulation   Comments back to work          Education: Education Goals: Education classes will be provided on a variety of topics geared toward better understanding of heart health and risk factor modification. Participant will state understanding/return demonstration of topics presented as noted by education test scores.  Learning Barriers/Preferences:  Learning Barriers/Preferences - 10/25/23 1348       Learning Barriers/Preferences   Learning Barriers None    Learning  Preferences None          General Cardiac Education Topics:  AED/CPR: - Group verbal and written instruction with the use of models to demonstrate the basic use of the AED with the basic ABC's of resuscitation.   Anatomy and Cardiac Procedures: - Group verbal and visual presentation and models provide information about basic cardiac anatomy and function. Reviews the testing methods done to diagnose heart disease and the outcomes of the test results. Describes the treatment choices: Medical Management, Angioplasty, or Coronary Bypass Surgery for treating various heart conditions including Myocardial Infarction, Angina, Valve Disease, and Cardiac Arrhythmias.  Written material given at graduation.   Medication Safety: - Group verbal and visual instruction to review commonly prescribed medications for heart and lung disease. Reviews the medication, class of the drug, and side effects. Includes the steps to properly store meds and maintain the prescription regimen.  Written material given at graduation.   Intimacy: - Group verbal instruction through game format to discuss how heart and lung disease can affect sexual intimacy. Written material given at graduation..   Know Your Numbers and Heart Failure: - Group verbal and visual instruction to discuss disease risk factors for cardiac and pulmonary disease and treatment options.  Reviews associated critical values for Overweight/Obesity, Hypertension, Cholesterol, and Diabetes.  Discusses basics of heart failure: signs/symptoms and treatments.  Introduces Heart Failure Zone chart for action plan for heart failure.  Written material given at graduation.   Infection Prevention: - Provides verbal and written material to individual with discussion of infection control including proper hand washing and proper equipment cleaning during exercise session. Flowsheet Row Cardiac Rehab from 11/21/2023 in [facility]  Date 10/31/23   Educator [facility]  Instruction Review Code 1- Verbalizes Understanding    Falls Prevention: - Provides verbal and written material to individual with discussion of falls prevention and safety. Flowsheet Row Cardiac Rehab from 11/21/2023 in [facility] Cardiac and Pulmonary Rehab  Date 10/31/23  Educator [facility]  Instruction Review Code 1- Verbalizes Understanding    Other: -Provides group  and verbal instruction on various topics (see comments)   Knowledge Questionnaire Score:  Knowledge Questionnaire Score - 10/31/23 1318       Knowledge Questionnaire Score   Pre Score 25/26          Core Components/Risk Factors/Patient Goals at Admission:  Personal Goals and Risk Factors at Admission - 10/25/23 1348       Core Components/Risk Factors/Patient Goals on Admission    Weight Management Yes;Weight Loss    Intervention Weight Management: Develop a combined nutrition and exercise program designed to reach desired caloric intake, while maintaining appropriate intake of nutrient and fiber, sodium and fats, and appropriate energy expenditure required for the weight goal.;Weight Management: Provide education and appropriate resources to help participant work on and attain dietary goals.    Admit Weight 177 lb (80.3 kg)    Goal Weight: Short Term 176 lb (79.8 kg)    Goal Weight: Long Term 162 lb (73.5 kg)    Expected Outcomes Short Term: Continue to assess and modify interventions until short term weight is achieved;Long Term: Adherence to nutrition and physical activity/exercise program aimed toward attainment of established weight goal;Weight Loss: Understanding of general recommendations for a balanced deficit meal plan, which promotes 1-2 lb weight loss per week and includes a negative energy balance of [phone] kcal/d    Lipids Yes    Intervention Provide education and support for participant on nutrition & aerobic/resistive exercise along with prescribed medications to achieve LDL 70mg , HDL >40mg .     Expected Outcomes Short Term: Participant states understanding of desired cholesterol values and is compliant with medications prescribed. Participant is following exercise prescription and nutrition guidelines.;Long Term: Cholesterol controlled with medications as prescribed, with individualized exercise RX and with personalized nutrition plan. Value goals: LDL < 70mg , HDL > 40 mg.          Education:Diabetes - Individual verbal and written instruction to review signs/symptoms of diabetes, desired ranges of glucose level fasting, after meals and with exercise. Acknowledge that pre and post exercise glucose checks will be done for 3 sessions at entry of program.   Core Components/Risk Factors/Patient Goals Review:    Core Components/Risk Factors/Patient Goals at Discharge (Final Review):    ITP Comments:  ITP Comments     Row Name 10/25/23 1427 10/31/23 1203 11/12/23 1602 11/28/23 0950     ITP Comments Virtual orientation call completed today. shehas an appointment on Date: 10/31/2023  for EP eval and gym Orientation.  Documentation of diagnosis can be found in [facility] 10/10/2023 . Completed and gym orientation for cardiac rehab. Initial ITP created and sent for review to Dr. Oneil Pinal, Medical Director. First full day of exercise!  Patient was oriented to gym and equipment including functions, settings, policies, and procedures.  Patient's individual exercise prescription and treatment plan were reviewed.  All starting workloads were established based on the results of the 6 minute walk test done at initial orientation visit.  The plan for exercise progression was also introduced and progression will be customized based on patient's performance and goals. 30 Day review completed. Medical Director ITP review done, changes made as directed, and signed approval by Medical Director. New to program.       Comments: 30 day review

## 2023-11-29 ENCOUNTER — Ambulatory Visit

## 2023-11-30 ENCOUNTER — Encounter: Attending: Cardiovascular Disease

## 2023-11-30 DIAGNOSIS — Z955 Presence of coronary angioplasty implant and graft: Secondary | ICD-10-CM | POA: Insufficient documentation

## 2023-11-30 NOTE — Progress Notes (Signed)
 Daily Session Note  Patient Details  Name: Pamela Lara MRN: [id number] Date of Birth: 16-Feb-1961 Referring Provider:   Flowsheet Row Cardiac Rehab from 10/31/2023 in [facility] Cardiac and Pulmonary Rehab  Referring Provider Dr. Deatrice Cage    Encounter Date: 11/30/2023  Check In:  Session Check In - 11/30/23 0943       Check-In   Supervising physician immediately available to respond to emergencies See telemetry face sheet for immediately available ER MD    Location [facility]-Cardiac & Pulmonary Rehab    Staff Present Josette Shallow RN,BC,MSN;Maxon Adrian BS, Exercise Physiologist;Noah Tickle, BS, Exercise Physiologist;Joseph Rolinda RCP,RRT,BSRT    Virtual Visit No    Medication changes reported     No    Fall or balance concerns reported    No    Warm-up and Cool-down Performed on first and last piece of equipment    Resistance Training Performed Yes    VAD Patient? No    PAD/SET Patient? No      Pain Assessment   Currently in Pain? No/denies    Multiple Pain Sites No             Social History   Tobacco Use  Smoking Status Former   Current packs/day: 0.00   Average packs/day: 0.3 packs/day for 38.0 years (9.5 ttl pk-yrs)   Types: Cigarettes   Start date: 06/29/1979   Quit date: 06/28/2017   Years since quitting: 6.4  Smokeless Tobacco Never  Tobacco Comments   since age 25/  07-27-2017 per pt quit smoking 02/ 28/ 2019 [place]    Goals Met:  Independence with exercise equipment Exercise tolerated well No report of concerns or symptoms today Strength training completed today  Goals Unmet:  Not Applicable  Comments: Pt able to follow exercise prescription today without complaint.  Will continue to monitor for progression.    Dr. Oneil Pinal is Medical Director for [facility] Cardiac Rehabilitation.  Dr. Fuad Aleskerov is Medical Director for [facility] Pulmonary Rehabilitation.

## 2023-12-03 ENCOUNTER — Encounter: Admitting: *Deleted

## 2023-12-03 DIAGNOSIS — Z955 Presence of coronary angioplasty implant and graft: Secondary | ICD-10-CM

## 2023-12-03 NOTE — Progress Notes (Signed)
 Daily Session Note  Patient Details  Name: Pamela Lara MRN: [id number] Date of Birth: 1961-04-30 Referring Provider:   Flowsheet Row Cardiac Rehab from 10/31/2023 in [facility] Cardiac and Pulmonary Rehab  Referring Provider Dr. Deatrice Cage    Encounter Date: 12/03/2023  Check In:  Session Check In - 12/03/23 1622       Check-In   Supervising physician immediately available to respond to emergencies See telemetry face sheet for immediately available ER MD    Location [facility]-Cardiac & Pulmonary Rehab    Staff Present Rollene Paterson, MS, Exercise Physiologist;Laureen Delores, BS, RRT, CPFT;Dina Mobley Tressa RN,BSN;Joseph Rolinda RCP,RRT,BSRT    Virtual Visit No    Medication changes reported     No    Fall or balance concerns reported    No    Warm-up and Cool-down Performed on first and last piece of equipment    Resistance Training Performed Yes    VAD Patient? No    PAD/SET Patient? No      Pain Assessment   Currently in Pain? No/denies             Social History   Tobacco Use  Smoking Status Former   Current packs/day: 0.00   Average packs/day: 0.3 packs/day for 38.0 years (9.5 ttl pk-yrs)   Types: Cigarettes   Start date: 06/29/1979   Quit date: 06/28/2017   Years since quitting: 6.4  Smokeless Tobacco Never  Tobacco Comments   since age 63/  07-27-2017 per pt quit smoking 02/ 28/ 2019 [place]    Goals Met:  Independence with exercise equipment Exercise tolerated well No report of concerns or symptoms today Strength training completed today  Goals Unmet:  Not Applicable  Comments: Pt able to follow exercise prescription today without complaint.  Will continue to monitor for progression.    Dr. Oneil Pinal is Medical Director for [facility] Cardiac Rehabilitation.  Dr. Fuad Aleskerov is Medical Director for [facility] Pulmonary Rehabilitation.

## 2023-12-04 ENCOUNTER — Encounter

## 2023-12-04 ENCOUNTER — Ambulatory Visit: Admitting: Cardiovascular Disease

## 2023-12-04 DIAGNOSIS — Z955 Presence of coronary angioplasty implant and graft: Secondary | ICD-10-CM

## 2023-12-04 NOTE — Progress Notes (Signed)
 Daily Session Note  Patient Details  Name: Pamela Lara MRN: [id number] Date of Birth: May 16, 1960 Referring Provider:   Flowsheet Row Cardiac Rehab from 10/31/2023 in [facility] Cardiac and Pulmonary Rehab  Referring Provider Dr. Deatrice Cage    Encounter Date: 12/04/2023  Check In:  Session Check In - 12/04/23 1103       Check-In   Supervising physician immediately available to respond to emergencies See telemetry face sheet for immediately available ER MD    Location [facility]-Cardiac & Pulmonary Rehab    Staff Present Burnard Davenport [facility] Dyane BS, ACSM CEP, Exercise Physiologist;Margaret Best, MS, Exercise Physiologist;Jason Elnor RDN,LDN;Meredith Tressa RN,BSN;Noah Tickle, BS, Exercise Physiologist    Virtual Visit No    Medication changes reported     No    Fall or balance concerns reported    No    Tobacco Cessation No Change    Warm-up and Cool-down Performed on first and last piece of equipment    Resistance Training Performed Yes    VAD Patient? No    PAD/SET Patient? No      Pain Assessment   Currently in Pain? No/denies             Social History   Tobacco Use  Smoking Status Former   Current packs/day: 0.00   Average packs/day: 0.3 packs/day for 38.0 years (9.5 ttl pk-yrs)   Types: Cigarettes   Start date: 06/29/1979   Quit date: 06/28/2017   Years since quitting: 6.4  Smokeless Tobacco Never  Tobacco Comments   since age 30/  07-27-2017 per pt quit smoking 02/ 28/ 2019 [place]    Goals Met:  Independence with exercise equipment Exercise tolerated well No report of concerns or symptoms today Strength training completed today  Goals Unmet:  Not Applicable  Comments: Pt able to follow exercise prescription today without complaint.  Will continue to monitor for progression.    Dr. Oneil Pinal is Medical Director for [facility] Cardiac Rehabilitation.  Dr. Fuad Aleskerov is Medical Director for [facility] Pulmonary Rehabilitation.

## 2023-12-06 ENCOUNTER — Encounter: Admitting: *Deleted

## 2023-12-06 DIAGNOSIS — Z955 Presence of coronary angioplasty implant and graft: Secondary | ICD-10-CM

## 2023-12-06 NOTE — Progress Notes (Signed)
 Daily Session Note  Patient Details  Name: Pamela Lara MRN: [id number] Date of Birth: Dec 26, 1960 Referring Provider:   Flowsheet Row Cardiac Rehab from 10/31/2023 in [facility] Cardiac and Pulmonary Rehab  Referring Provider Dr. Deatrice Cage    Encounter Date: 12/06/2023  Check In:  Session Check In - 12/06/23 1113       Check-In   Supervising physician immediately available to respond to emergencies See telemetry face sheet for immediately available ER MD    Location [facility]-Cardiac & Pulmonary Rehab    Staff Present Maxon Burnell HECKLE, Exercise Physiologist;Joseph Hood RCP,RRT,BSRT;Meredith Tressa RN,BSN   Leita Franks RN BSN   Virtual Visit No    Medication changes reported     No    Fall or balance concerns reported    No    Warm-up and Cool-down Performed on first and last piece of equipment    Resistance Training Performed Yes    VAD Patient? No    PAD/SET Patient? No      Pain Assessment   Currently in Pain? No/denies             Social History   Tobacco Use  Smoking Status Former   Current packs/day: 0.00   Average packs/day: 0.3 packs/day for 38.0 years (9.5 ttl pk-yrs)   Types: Cigarettes   Start date: 06/29/1979   Quit date: 06/28/2017   Years since quitting: 6.4  Smokeless Tobacco Never  Tobacco Comments   since age 40/  07-27-2017 per pt quit smoking 02/ 28/ 2019 [place]    Goals Met:  Independence with exercise equipment Exercise tolerated well No report of concerns or symptoms today  Goals Unmet:  Not Applicable  Comments: Pt able to follow exercise prescription today without complaint.  Will continue to monitor for progression.    Dr. Oneil Pinal is Medical Director for [facility] Cardiac Rehabilitation.  Dr. Fuad Aleskerov is Medical Director for [facility] Pulmonary Rehabilitation.

## 2023-12-10 ENCOUNTER — Ambulatory Visit

## 2023-12-10 ENCOUNTER — Encounter

## 2023-12-10 DIAGNOSIS — Z955 Presence of coronary angioplasty implant and graft: Secondary | ICD-10-CM

## 2023-12-10 NOTE — Progress Notes (Signed)
 Daily Session Note  Patient Details  Name: Pamela Lara MRN: [id number] Date of Birth: March 02, 1961 Referring Provider:   Flowsheet Row Cardiac Rehab from 10/31/2023 in [facility] Cardiac and Pulmonary Rehab  Referring Provider Dr. Deatrice Cage    Encounter Date: 12/10/2023  Check In:  Session Check In - 12/10/23 0742       Check-In   Supervising physician immediately available to respond to emergencies See telemetry face sheet for immediately available ER MD    Location [facility]-Cardiac & Pulmonary Rehab    Staff Present Burnard Davenport RN,BSN,MPA;Joseph [facility] Dyane BS, ACSM CEP, Exercise Physiologist;Jason Elnor RDN,LDN    Virtual Visit No    Medication changes reported     No    Fall or balance concerns reported    No    Tobacco Cessation No Change    Warm-up and Cool-down Performed on first and last piece of equipment    Resistance Training Performed Yes    VAD Patient? No    PAD/SET Patient? No      Pain Assessment   Currently in Pain? No/denies             Social History   Tobacco Use  Smoking Status Former   Current packs/day: 0.00   Average packs/day: 0.3 packs/day for 38.0 years (9.5 ttl pk-yrs)   Types: Cigarettes   Start date: 06/29/1979   Quit date: 06/28/2017   Years since quitting: 6.4  Smokeless Tobacco Never  Tobacco Comments   since age 67/  07-27-2017 per pt quit smoking 02/ 28/ 2019 [place]    Goals Met:  Independence with exercise equipment Exercise tolerated well No report of concerns or symptoms today Strength training completed today  Goals Unmet:  Not Applicable  Comments: Pt able to follow exercise prescription today without complaint.  Will continue to monitor for progression.    Dr. Oneil Pinal is Medical Director for [facility] Cardiac Rehabilitation.  Dr. Fuad Aleskerov is Medical Director for [facility] Pulmonary Rehabilitation.

## 2023-12-11 ENCOUNTER — Ambulatory Visit: Admitting: Cardiovascular Disease

## 2023-12-12 ENCOUNTER — Encounter

## 2023-12-12 DIAGNOSIS — Z955 Presence of coronary angioplasty implant and graft: Secondary | ICD-10-CM

## 2023-12-12 NOTE — Progress Notes (Signed)
 Daily Session Note  Patient Details  Name: Pamela Lara MRN: [id number] Date of Birth: [birth year] Referring Provider:   Flowsheet Row Cardiac Rehab from 10/31/2023 in [facility] Cardiac and Pulmonary Rehab  Referring Provider Dr. Deatrice Cage    Encounter Date: 12/12/2023  Check In:  Session Check In - 12/12/23 1538       Check-In   Supervising physician immediately available to respond to emergencies See telemetry face sheet for immediately available ER MD    Location [facility]-Cardiac & Pulmonary Rehab    Staff Present Burnard Davenport RN,BSN,MPA;Maxon Burnell BS, Exercise Physiologist;Joseph [facility] Dyane BS, ACSM CEP, Exercise Physiologist;Meredith Tressa RN,BSN    Virtual Visit No    Medication changes reported     No    Fall or balance concerns reported    No    Tobacco Cessation No Change    Warm-up and Cool-down Performed on first and last piece of equipment    Resistance Training Performed Yes    VAD Patient? No    PAD/SET Patient? No      Pain Assessment   Currently in Pain? No/denies             Social History   Tobacco Use  Smoking Status Former   Current packs/day: 0.00   Average packs/day: 0.3 packs/day for 38.0 years (9.5 ttl pk-yrs)   Types: Cigarettes   Start date: 06/29/1979   Quit date: 06/28/2017   Years since quitting: 6.4  Smokeless Tobacco Never  Tobacco Comments   since age 12/  07-27-2017 per pt quit smoking 02/ 28/ 2019 [place]    Goals Met:  Independence with exercise equipment Exercise tolerated well No report of concerns or symptoms today Strength training completed today  Goals Unmet:  Not Applicable  Comments: Pt able to follow exercise prescription today without complaint.  Will continue to monitor for progression.    Dr. Oneil Pinal is Medical Director for [facility] Cardiac Rehabilitation.  Dr. Fuad Aleskerov is Medical Director for [facility] Pulmonary Rehabilitation.

## 2023-12-13 ENCOUNTER — Ambulatory Visit: Admitting: Nurse Practitioner

## 2023-12-13 ENCOUNTER — Encounter: Admitting: Emergency Medicine

## 2023-12-13 ENCOUNTER — Encounter: Payer: Self-pay | Admitting: Nurse Practitioner

## 2023-12-13 ENCOUNTER — Ambulatory Visit: Attending: Nurse Practitioner | Admitting: Nurse Practitioner

## 2023-12-13 VITALS — BP 117/62 | HR 68 | Ht 65.0 in | Wt 175.6 lb

## 2023-12-13 DIAGNOSIS — Z955 Presence of coronary angioplasty implant and graft: Secondary | ICD-10-CM

## 2023-12-13 DIAGNOSIS — I2510 Atherosclerotic heart disease of native coronary artery without angina pectoris: Secondary | ICD-10-CM | POA: Diagnosis not present

## 2023-12-13 DIAGNOSIS — E785 Hyperlipidemia, unspecified: Secondary | ICD-10-CM | POA: Diagnosis not present

## 2023-12-13 NOTE — Progress Notes (Signed)
 Daily Session Note  Patient Details  Name: Pamela Lara MRN: [id number] Date of Birth: [birth year] Referring Provider:   Flowsheet Row Cardiac Rehab from 10/31/2023 in [facility] Cardiac and Pulmonary Rehab  Referring Provider Dr. Deatrice Cage    Encounter Date: 12/13/2023  Check In:  Session Check In - 12/13/23 1124       Check-In   Supervising physician immediately available to respond to emergencies See telemetry face sheet for immediately available ER MD    Location [facility]-Cardiac & Pulmonary Rehab    Staff Present Othel Durand, RN, BSN, CCRP;Joseph Hood RCP,RRT,BSRT;Maxon Glencoe BS, Exercise Physiologist;Jason Elnor RDN,LDN    Virtual Visit No    Medication changes reported     Yes    Comments stopped metoprolol     Fall or balance concerns reported    No    Tobacco Cessation No Change    Warm-up and Cool-down Performed on first and last piece of equipment    Resistance Training Performed Yes    VAD Patient? No    PAD/SET Patient? No      Pain Assessment   Currently in Pain? No/denies             Social History   Tobacco Use  Smoking Status Former   Current packs/day: 0.00   Average packs/day: 0.3 packs/day for 38.0 years (9.5 ttl pk-yrs)   Types: Cigarettes   Start date: 06/29/1979   Quit date: 06/28/2017   Years since quitting: 6.4  Smokeless Tobacco Never  Tobacco Comments   since age 59/  07-27-2017 per pt quit smoking 02/ 28/ 2019 [place]    Goals Met:  Independence with exercise equipment Exercise tolerated well No report of concerns or symptoms today Strength training completed today  Goals Unmet:  Not Applicable  Comments: Pt able to follow exercise prescription today without complaint.  Will continue to monitor for progression.    Dr. Oneil Pinal is Medical Director for [facility] Cardiac Rehabilitation.  Dr. Fuad Aleskerov is Medical Director for [facility] Pulmonary Rehabilitation.

## 2023-12-13 NOTE — Patient Instructions (Addendum)
 Medication Instructions:   Your physician has recommended you make the following change in your medication:   STOP: Metoprolol  Succinate.  Lab Work:  Your provider would like for you to return to have the following labs drawn:   [employer] in 4-6 Weeks .  You need to be fasting nothing to eat or drink (except water  or black coffee no sugar or cream) after midnight the night before.  If you have labs (blood work) drawn today and your tests are completely normal, you will receive your results only by: MyChart Message (if you have MyChart) OR A paper copy in the mail If you have any lab test that is abnormal or we need to change your treatment, we will call you to review the results.  Testing/Procedures:  NONE  Follow-Up: At [facility], you and your health needs are our priority.  As part of our continuing mission to provide you with exceptional heart care, our providers are all part of one team.  This team includes your primary Cardiologist (physician) and Advanced Practice Providers or APPs (Physician Assistants and Nurse Practitioners) who all work together to provide you with the care you need, when you need it.  Your next appointment:   3 month(s)  Provider:   Deatrice Cage, MD    We recommend signing up for the patient portal called MyChart.  Sign up information is provided on this After Visit Summary.  MyChart is used to connect with patients for Virtual Visits (Telemedicine).  Patients are able to view lab/test results, encounter notes, upcoming appointments, etc.  Non-urgent messages can be sent to your provider as well.   To learn more about what you can do with MyChart, go to [email or web address].

## 2023-12-13 NOTE — Progress Notes (Signed)
 Office Visit    Patient Name: Pamela Lara Date of Encounter: 12/13/2023  Primary Care Provider:  Marylynn Verneita LITTIE, MD Primary Cardiologist:  Pamela Cage, MD    Chief Complaint    63 y.o. female with a history of CAD, hyperlipidemia, GERD, vertigo, arthritis, and seasonal affective disorder, who presents for CAD f/u.  Past Medical History   Subjective   Past Medical History:  Diagnosis Date   Arthritis    right shoulder   BPPV (benign paroxysmal positional vertigo)    CAD (coronary artery disease)    a. 09/2023 Cor CTA: Sev prox/mid LAD dzs w/ FFRct 0.64; b. 09/2023 PCI: LM 99p/m (3.5x20 Synergy DES), 39m, D1 99 (jailed-->DCBA), RI nl, LCX nl, RCA nl, EF 55-65%.   Congenital bowed legs    s/p  surgical correction   Diverticulitis, colon    recurrent   Dysplastic nevus 09/09/2018   R mid post thigh   Dysplastic nevus 07/09/2019   R buttock,moderate atypia   Dysplastic nevus 07/09/2019   L buttock, moderate atypia   Dysplastic nevus 07/09/2019   R lat popliteal, moderate atypia   Ectopic pregnancy 01/29/2000   GERD (gastroesophageal reflux disease)    watch diet (as needed takes apple cider vinager)   History of colonic diverticulitis    hx multiple acute diverticulitis episodes treated medically   History of ectopic pregnancy    History of Helicobacter pylori infection 2013  approx.   Melanoma (HCC) 04/19/2018   R prox bicep (MMIS)   PONV (postoperative nausea and vomiting)    Seasonal affective disorder [facility])    Past Surgical History:  Procedure Laterality Date   CHOLECYSTECTOMY OPEN  1984   COLON SURGERY  April 2019   Sigmoid colon resection   COLONOSCOPY WITH PROPOFOL  N/A 04/19/2017   Procedure: COLONOSCOPY WITH PROPOFOL ;  Surgeon: Jinny Carmine, MD;  Location: [facility];  Service: Endoscopy;  Laterality: N/A;   COLONOSCOPY WITH PROPOFOL  N/A 02/16/2023   Procedure: COLONOSCOPY WITH PROPOFOL ;  Surgeon: Jinny Carmine, MD;  Location: [facility];  Service: Endoscopy;  Laterality: N/A;   CORONARY STENT INTERVENTION N/A 10/10/2023   Procedure: CORONARY STENT INTERVENTION;  Surgeon: Lara Pamela LABOR, MD;  Location: [facility];  Service: Cardiovascular;  Laterality: N/A;  LAD and DIAG   CYSTOSCOPY WITH STENT PLACEMENT Bilateral 08/15/2017   Procedure: CYSTOSCOPY WITH  FOLEY PLACEMENT WITH BILATERAL open ended catheter placement.  catheters removed at the end of the case.;  Surgeon: Carolee Sherwood JONETTA DOUGLAS, MD;  Location: [facility];  Service: Urology;  Laterality: Bilateral;   EXCISIONAL SENTINAL LYMPH NODE BIOPSY, NECK N/A 2016  approx.   per pt thyroid  area , benign   LAPAROSCOPIC SIGMOID COLECTOMY N/A 08/15/2017   Procedure: LAPAROSCOPIC SIGMOID COLECTOMY  ERAS PATHWAY;  Surgeon: Teresa Lonni HERO, MD;  Location: [facility];  Service: General;  Laterality: N/A;   LAPAROSCOPY FOR ECTOPIC PREGNANCY  2001   also  BILATERAL TUBAL LIGATION   LEFT HEART CATH AND CORONARY ANGIOGRAPHY N/A 10/10/2023   Procedure: LEFT HEART CATH AND CORONARY ANGIOGRAPHY;  Surgeon: Lara Pamela LABOR, MD;  Location: [facility];  Service: Cardiovascular;  Laterality: N/A;   OSTEOTOMY Bilateral 1980 and 1981   correction congenital bow legged   POLYPECTOMY N/A 02/16/2023   Procedure: POLYPECTOMY;  Surgeon: Jinny Carmine, MD;  Location: [facility];  Service: Endoscopy;  Laterality: N/A;   TONSILLECTOMY     TUBAL LIGATION  Allergies  Allergies  Allergen Reactions   Ivp Dye [Iodinated Contrast Media] Shortness Of Breath   Morphine And Codeine Nausea And Vomiting   Other     General anesthesia makes her nauseated with vomiting     Wellbutrin  [Bupropion ] Other (See Comments)    Joint pain       History of Present Illness      63 y.o. y/o female with above past medical history including CAD, hyperlipidemia, GERD, vertigo, arthritis, and seasonal affective disorder.  She established care with Dr. Darron in June 2025 after ED  visit for right-sided chest pain and tightness associated dyspnea.  In the ED, troponin was normal and CTA was negative for PE.  Following cardiology visit, she underwent coronary CT angiogram which suggested severe proximal LAD disease with an FFRct of 0.64.  She underwent diagnostic catheterization which confirmed severe proximal LAD disease and this was successfully treated with drug-eluting stent.  The first diagonal was treated with drug-coated balloon angioplasty and she was subsequently discharged home on dual antiplatelet therapy.     [name] was last seen in cardiology clinic in June 2025, at which time she was doing well with improved activity tolerance.  She has since been participating in cardiac rehabilitation and notes that though the exercise has been difficult at times, she is thriving and doing well without chest pain or dyspnea.  She has lost 5 pounds.  She has adjusted her diet at home as well.  She has been having some orthostatic lightheadedness in the setting of low-dose metoprolol  therapy and would like to discontinue altogether.  She denies palpitations, PND, orthopnea, dizziness, syncope, edema, or early satiety.  She had a question about a particular tea that she is drinking which she thinks contains an herbal supplement that thins the blood.  She buys this in the supermarket.  She cannot remember what the supplement is but will let us  know via MyChart.  Objective   Home Medications    Current Outpatient Medications  Medication Sig Dispense Refill   acetaminophen  (TYLENOL ) 500 MG tablet Take 1,000 mg by mouth every 6 (six) hours as needed for moderate pain (pain score 4-6) or mild pain (pain score 1-3).     aspirin  EC 81 MG tablet Take 81 mg by mouth daily. Swallow whole.     atorvastatin  (LIPITOR) 80 MG tablet Take 1 tablet (80 mg total) by mouth daily. 90 tablet 3   calcium  carbonate (TUMS - DOSED IN MG ELEMENTAL CALCIUM ) 500 MG chewable tablet Chew 1 tablet by mouth as  needed for indigestion or heartburn.     Cholecalciferol  (VITAMIN D3) 1000 UNITS CAPS Take 1,000 Units by mouth daily.     influenza vac split quadrivalent PF (FLUARIX  QUADRIVALENT) 0.5 ML injection Inject into the muscle. 0.5 mL 0   [place] Ginseng 1000 MG TABS Take 1,000 mg by mouth daily.      metoprolol  succinate (TOPROL -XL) 12.5 mg TB24 24 hr tablet Take 12.5 mg by mouth daily.     Multiple Vitamin (MULTIVITAMIN) tablet Take 1 tablet by mouth daily.     nitroGLYCERIN  (NITROSTAT ) 0.4 MG SL tablet Place 1 tablet (0.4 mg total) under the tongue every 5 (five) minutes as needed for chest pain. 25 tablet 3   Omega-3 Fatty Acids (FISH OIL) 1000 MG CAPS Take 1,800 mg by mouth daily. 900 mg each     omeprazole  (PRILOSEC) 20 MG capsule Take 20 mg by mouth daily. (Patient taking differently: Take 20 mg by  mouth daily. Taking as needed)     prasugrel  (EFFIENT ) 10 MG TABS tablet Take 1 tablet (10 mg total) by mouth daily. 90 tablet 3   Psyllium (METAMUCIL FIBER PO) Take 15 mLs by mouth every evening.     No current facility-administered medications for this visit.     Physical Exam    VS:  BP 117/62 (BP Location: Left Arm, Patient Position: Sitting, Cuff Size: Normal)   Pulse 68   Ht 5' 5 (1.651 m)   Wt 175 lb 9.6 oz (79.7 kg)   LMP 10/03/2015 (Approximate)   SpO2 98%   BMI 29.22 kg/m  , BMI Body mass index is 29.22 kg/m.          GEN: Well nourished, well developed, in no acute distress. HEENT: normal. Neck: Supple, no JVD, carotid bruits, or masses. Cardiac: RRR, no murmurs, rubs, or gallops. No clubbing, cyanosis, edema.  Radials 2+/PT 2+ and equal bilaterally.  Respiratory:  Respirations regular and unlabored, clear to auscultation bilaterally. GI: Soft, nontender, nondistended, BS + x 4. MS: no deformity or atrophy. Skin: warm and dry, no rash. Neuro:  Strength and sensation are intact. Psych: Normal affect.  Accessory Clinical Findings    Lab Results  Component Value Date    WBC 13.9 (H) 10/11/2023   HGB 14.0 10/11/2023   HCT 39.4 10/11/2023   MCV 87.2 10/11/2023   PLT 204 10/11/2023   Lab Results  Component Value Date   CREATININE 0.77 10/11/2023   BUN 12 10/11/2023   NA 139 10/11/2023   K 3.4 (L) 10/11/2023   CL 106 10/11/2023   CO2 23 10/11/2023   Lab Results  Component Value Date   ALT 41 11/20/2023   AST 27 11/20/2023   ALKPHOS 60 11/20/2023   BILITOT 1.1 11/20/2023   Lab Results  Component Value Date   CHOL 161 11/20/2023   HDL 48 11/20/2023   LDLCALC 78 11/20/2023   LDLDIRECT 136.0 10/05/2022   TRIG 175 (H) 11/20/2023   CHOLHDL 3.4 11/20/2023    Lab Results  Component Value Date   HGBA1C 5.4 10/05/2022   Lab Results  Component Value Date   TSH 1.18 10/05/2022       Assessment & Plan    1.  Coronary artery disease: Status post PCI drug-eluting stent placement to the LAD with PTCA of the diagonal following an episode of chest pain in June 2025 with subsequent abnormal coronary CTA.  She has been participating in cardiac rehabilitation and doing well without chest pain or dyspnea.  She has lost 5 pounds.  She has continued to have mild orthostatic symptoms and low blood pressures on metoprolol  12.5 mg daily.  We will discontinue this.  She remains on aspirin , statin, and Zetia.  2.  Hyperlipidemia/hypertriglyceridemia: Historically on fish oil.  Placed on atorvastatin  post PCI with follow-up lipids in July 2025 with LDL of 78.  We discussed initiation of Zetia though agreed on the plan of diet and exercise first.  She has lost 5 pounds and has made dietary changes at home.  We agreed to follow-up fasting lipids in about 4 to 6 weeks to see where things stand and make a decision on additional pharmacologic therapy.  3.  Disposition: Follow-up fasting lipids in 4 to 6 weeks.  Follow-up in clinic in 3 months or sooner if necessary.  Lonni Meager, NP 12/13/2023, 9:57 AM

## 2023-12-19 ENCOUNTER — Ambulatory Visit

## 2023-12-20 ENCOUNTER — Encounter: Admitting: Emergency Medicine

## 2023-12-20 DIAGNOSIS — Z955 Presence of coronary angioplasty implant and graft: Secondary | ICD-10-CM | POA: Diagnosis not present

## 2023-12-20 NOTE — Progress Notes (Signed)
 Daily Session Note  Patient Details  Name: Pamela Lara MRN: [id number] Date of Birth: [birth year] Referring Provider:   Flowsheet Row Cardiac Rehab from 10/31/2023 in [facility] Cardiac and Pulmonary Rehab  Referring Provider Dr. Deatrice Cage    Encounter Date: 12/20/2023  Check In:  Session Check In - 12/20/23 1044       Check-In   Supervising physician immediately available to respond to emergencies See telemetry face sheet for immediately available ER MD    Location [facility]-Cardiac & Pulmonary Rehab    Staff Present Othel Durand, RN, BSN, CCRP;Maxon Conetta BS, Exercise Physiologist;Joseph Hood RCP,RRT,BSRT;Kyrstyn Greear RN,BSN;Jason Elnor RDN,LDN    Virtual Visit No    Medication changes reported     No    Fall or balance concerns reported    No    Tobacco Cessation No Change    Warm-up and Cool-down Performed on first and last piece of equipment    Resistance Training Performed Yes    VAD Patient? No    PAD/SET Patient? No      Pain Assessment   Currently in Pain? No/denies             Social History   Tobacco Use  Smoking Status Former   Current packs/day: 0.00   Average packs/day: 0.3 packs/day for 38.0 years (9.5 ttl pk-yrs)   Types: Cigarettes   Start date: 06/29/1979   Quit date: 06/28/2017   Years since quitting: 6.4  Smokeless Tobacco Never  Tobacco Comments   since age 19/  07-27-2017 per pt quit smoking 02/ 28/ 2019 [place]    Goals Met:  Independence with exercise equipment Exercise tolerated well No report of concerns or symptoms today Strength training completed today  Goals Unmet:  Not Applicable  Comments: Pt able to follow exercise prescription today without complaint.  Will continue to monitor for progression.    Dr. Oneil Pinal is Medical Director for [facility] Cardiac Rehabilitation.  Dr. Fuad Aleskerov is Medical Director for [facility] Pulmonary Rehabilitation.

## 2023-12-21 ENCOUNTER — Ambulatory Visit

## 2023-12-21 DIAGNOSIS — Z955 Presence of coronary angioplasty implant and graft: Secondary | ICD-10-CM | POA: Diagnosis not present

## 2023-12-21 NOTE — Progress Notes (Signed)
 Daily Session Note  Patient Details  Name: Pamela Lara MRN: [id number] Date of Birth: 03/12/1961 Referring Provider:   Flowsheet Row Cardiac Rehab from 10/31/2023 in [facility] Cardiac and Pulmonary Rehab  Referring Provider Dr. Deatrice Cage    Encounter Date: 12/21/2023  Check In:  Session Check In - 12/21/23 1100       Check-In   Supervising physician immediately available to respond to emergencies See telemetry face sheet for immediately available ER MD    Location [facility]-Cardiac & Pulmonary Rehab    Staff Present Burnard Davenport RN,BSN,MPA;Joseph Hood RCP,RRT,BSRT;Maxon Conetta BS, Exercise Physiologist;Noah Tickle, BS, Exercise Physiologist    Virtual Visit No    Medication changes reported     No    Fall or balance concerns reported    No    Tobacco Cessation No Change    Warm-up and Cool-down Performed on first and last piece of equipment    Resistance Training Performed Yes    VAD Patient? No    PAD/SET Patient? No      Pain Assessment   Currently in Pain? No/denies             Social History   Tobacco Use  Smoking Status Former   Current packs/day: 0.00   Average packs/day: 0.3 packs/day for 38.0 years (9.5 ttl pk-yrs)   Types: Cigarettes   Start date: 06/29/1979   Quit date: 06/28/2017   Years since quitting: 6.4  Smokeless Tobacco Never  Tobacco Comments   since age 32/  07-27-2017 per pt quit smoking 02/ 28/ 2019 cold [place]    Goals Met:  Independence with exercise equipment Exercise tolerated well No report of concerns or symptoms today Strength training completed today  Goals Unmet:  Not Applicable  Comments: Pt able to follow exercise prescription today without complaint.  Will continue to monitor for progression.    Dr. Oneil Pinal is Medical Director for [facility] Cardiac Rehabilitation.  Dr. Fuad Aleskerov is Medical Director for [facility] Pulmonary Rehabilitation.

## 2023-12-25 ENCOUNTER — Encounter: Admitting: *Deleted

## 2023-12-25 DIAGNOSIS — Z955 Presence of coronary angioplasty implant and graft: Secondary | ICD-10-CM

## 2023-12-25 NOTE — Progress Notes (Signed)
 Daily Session Note  Patient Details  Name: Pamela Lara MRN: [id number] Date of Birth: 12-12-1960 Referring Provider:   Flowsheet Row Cardiac Rehab from 10/31/2023 in [facility] Cardiac and Pulmonary Rehab  Referring Provider Dr. Deatrice Cage    Encounter Date: 12/25/2023  Check In:  Session Check In - 12/25/23 1147       Check-In   Staff Present Bruno Mirza RN,BSN;Maxon Conetta BS, Exercise Physiologist;Jason Elnor RDN,LDN;Noah Tickle, BS, Exercise Physiologist    Virtual Visit No    Medication changes reported     No    Fall or balance concerns reported    No    Warm-up and Cool-down Performed on first and last piece of equipment    Resistance Training Performed Yes    VAD Patient? No    PAD/SET Patient? No      Pain Assessment   Currently in Pain? No/denies             Social History   Tobacco Use  Smoking Status Former   Current packs/day: 0.00   Average packs/day: 0.3 packs/day for 38.0 years (9.5 ttl pk-yrs)   Types: Cigarettes   Start date: 06/29/1979   Quit date: 06/28/2017   Years since quitting: 6.4  Smokeless Tobacco Never  Tobacco Comments   since age 37/  07-27-2017 per pt quit smoking 02/ 28/ 2019 [place]    Goals Met:  Independence with exercise equipment Exercise tolerated well No report of concerns or symptoms today Strength training completed today  Goals Unmet:  Not Applicable  Comments: Pt able to follow exercise prescription today without complaint.  Will continue to monitor for progression.    Dr. Oneil Pinal is Medical Director for [facility] Cardiac Rehabilitation.  Dr. Fuad Aleskerov is Medical Director for [facility] Pulmonary Rehabilitation.

## 2023-12-26 DIAGNOSIS — Z955 Presence of coronary angioplasty implant and graft: Secondary | ICD-10-CM

## 2023-12-26 NOTE — Progress Notes (Signed)
 Cardiac Individual Treatment Plan  Patient Details  Name: Pamela Lara MRN: [id number] Date of Birth: 1961-03-17 Referring Provider:   Flowsheet Row Cardiac Rehab from 10/31/2023 in [facility] Cardiac and Pulmonary Rehab  Referring Provider Dr. Deatrice Cage    Initial Encounter Date:  Flowsheet Row Cardiac Rehab from 10/31/2023 in [facility] Cardiac and Pulmonary Rehab  Date 10/31/23    Visit Diagnosis: Status post coronary artery stent placement  Patient's Home Medications on Admission:  Current Outpatient Medications:    acetaminophen  (TYLENOL ) 500 MG tablet, Take 1,000 mg by mouth every 6 (six) hours as needed for moderate pain (pain score 4-6) or mild pain (pain score 1-3)., Disp: , Rfl:    aspirin  EC 81 MG tablet, Take 81 mg by mouth daily. Swallow whole., Disp: , Rfl:    atorvastatin  (LIPITOR) 80 MG tablet, Take 1 tablet (80 mg total) by mouth daily., Disp: 90 tablet, Rfl: 3   calcium  carbonate (TUMS - DOSED IN MG ELEMENTAL CALCIUM ) 500 MG chewable tablet, Chew 1 tablet by mouth as needed for indigestion or heartburn., Disp: , Rfl:    Cholecalciferol  (VITAMIN D3) 1000 UNITS CAPS, Take 1,000 Units by mouth daily., Disp: , Rfl:    influenza vac split quadrivalent PF (FLUARIX  QUADRIVALENT) 0.5 ML injection, Inject into the muscle., Disp: 0.5 mL, Rfl: 0   Korean Ginseng 1000 MG TABS, Take 1,000 mg by mouth daily. , Disp: , Rfl:    Multiple Vitamin (MULTIVITAMIN) tablet, Take 1 tablet by mouth daily., Disp: , Rfl:    nitroGLYCERIN  (NITROSTAT ) 0.4 MG SL tablet, Place 1 tablet (0.4 mg total) under the tongue every 5 (five) minutes as needed for chest pain., Disp: 25 tablet, Rfl: 3   Omega-3 Fatty Acids (FISH OIL) 1000 MG CAPS, Take 1,800 mg by mouth daily. 900 mg each, Disp: , Rfl:    omeprazole  (PRILOSEC) 20 MG capsule, Take 20 mg by mouth daily. (Patient taking differently: Take 20 mg by mouth daily. Taking as needed), Disp: , Rfl:    prasugrel  (EFFIENT ) 10 MG TABS tablet, Take 1 tablet (10  mg total) by mouth daily., Disp: 90 tablet, Rfl: 3   Psyllium (METAMUCIL FIBER PO), Take 15 mLs by mouth every evening., Disp: , Rfl:   Past Medical History: Past Medical History:  Diagnosis Date   Arthritis    right shoulder   BPPV (benign paroxysmal positional vertigo)    CAD (coronary artery disease)    a. 09/2023 Cor CTA: Sev prox/mid LAD dzs w/ FFRct 0.64; b. 09/2023 PCI: LM 99p/m (3.5x20 Synergy DES), 12m, D1 99 (jailed-->DCBA), RI nl, LCX nl, RCA nl, EF 55-65%.   Congenital bowed legs    s/p  surgical correction   Diverticulitis, colon    recurrent   Dysplastic nevus 09/09/2018   R mid post thigh   Dysplastic nevus 07/09/2019   R buttock,moderate atypia   Dysplastic nevus 07/09/2019   L buttock, moderate atypia   Dysplastic nevus 07/09/2019   R lat popliteal, moderate atypia   Ectopic pregnancy 01/29/2000   GERD (gastroesophageal reflux disease)    watch diet (as needed takes apple cider vinager)   History of colonic diverticulitis    hx multiple acute diverticulitis episodes treated medically   History of ectopic pregnancy    History of Helicobacter pylori infection 2013  approx.   Melanoma (HCC) 04/19/2018   R prox bicep (MMIS)   PONV (postoperative nausea and vomiting)    Seasonal affective disorder (HCC)     Tobacco Use:  Social History   Tobacco Use  Smoking Status Former   Current packs/day: 0.00   Average packs/day: 0.3 packs/day for 38.0 years (9.5 ttl pk-yrs)   Types: Cigarettes   Start date: 06/29/1979   Quit date: 06/28/2017   Years since quitting: 6.4  Smokeless Tobacco Never  Tobacco Comments   since age 62/  07-27-2017 per pt quit smoking 02/ 28/ 2019 cold [place]    Labs: Review Flowsheet  More data exists      Latest Ref Rng & Units 09/20/2020 09/21/2020 09/29/2021 10/05/2022 11/20/2023  Labs for ITP Cardiac and Pulmonary Rehab  Cholestrol 0 - 200 mg/dL 777  - 776  804  838   LDL (calc) 0 - 99 mg/dL 885  - - 894  78   Direct LDL mg/dL - - 860.9   863.9  -  HDL-C >40 mg/dL 51  - 43.99  45.19  48   Trlycerides <150 mg/dL 714  - 751.9  821.9  824   Hemoglobin A1c 4.6 - 6.5 % - 5.3  5.4  5.4  -     Exercise Target Goals: Exercise Program Goal: Individual exercise prescription set using results from initial 6 min walk test and THRR while considering  patient's activity barriers and safety.   Exercise Prescription Goal: Initial exercise prescription builds to 30-45 minutes a day of aerobic activity, 2-3 days per week.  Home exercise guidelines will be given to patient during program as part of exercise prescription that the participant will acknowledge.   Education: Aerobic Exercise: - Group verbal and visual presentation on the components of exercise prescription. Introduces F.I.T.T principle from ACSM for exercise prescriptions.  Reviews F.I.T.T. principles of aerobic exercise including progression. Written material provided at class time.   Education: Resistance Exercise: - Group verbal and visual presentation on the components of exercise prescription. Introduces F.I.T.T principle from ACSM for exercise prescriptions  Reviews F.I.T.T. principles of resistance exercise including progression. Written material provided at class time. Flowsheet Row Cardiac Rehab from 12/20/2023 in [facility] Cardiac and Pulmonary Rehab  Date 11/28/23  Educator [facility]  Instruction Review Code 1- [employer] Understanding     Education: Exercise & Equipment Safety: - Individual verbal instruction and demonstration of equipment use and safety with use of the equipment. Flowsheet Row Cardiac Rehab from 12/20/2023 in [facility] Cardiac and Pulmonary Rehab  Date 10/31/23  Educator [facility]  Instruction Review Code 1- Verbalizes Understanding    Education: Exercise Physiology & General Exercise Guidelines: - Group verbal and written instruction with models to review the exercise physiology of the cardiovascular system and associated critical values. Provides general exercise  guidelines with specific guidelines to those with heart or lung disease. Written material provided at class time. Flowsheet Row Cardiac Rehab from 12/20/2023 in [facility] Cardiac and Pulmonary Rehab  Date 11/21/23  Educator nt  Instruction Review Code 1- TEFL teacher Understanding    Education: Flexibility, Balance, Mind/Body Relaxation: - Group verbal and visual presentation with interactive activity on the components of exercise prescription. Introduces F.I.T.T principle from ACSM for exercise prescriptions. Reviews F.I.T.T. principles of flexibility and balance exercise training including progression. Also discusses the mind body connection.  Reviews various relaxation techniques to help reduce and manage stress (i.e. Deep breathing, progressive muscle relaxation, and visualization). Balance handout provided to take home. Written material provided at class time. Flowsheet Row Cardiac Rehab from 12/20/2023 in [facility] Cardiac and Pulmonary Rehab  Date 11/28/23  Educator [facility]  Instruction Review Code 1- Verbalizes Understanding    Activity  Barriers & Risk Stratification:  Activity Barriers & Cardiac Risk Stratification - 10/31/23 1205       Activity Barriers & Cardiac Risk Stratification   Activity Barriers None    Cardiac Risk Stratification Moderate          6 Minute Walk:  6 Minute Walk     Row Name 10/31/23 1204         6 Minute Walk   Phase Initial     Distance 1500 feet     Walk Time 6 minutes     # of Rest Breaks 0     MPH 2.84     METS 3.32     RPE 9     Perceived Dyspnea  0     VO2 Peak 11.6     Symptoms No     Resting HR 66 bpm     Resting BP 112/64     Resting Oxygen Saturation  97 %     Exercise Oxygen Saturation  during 6 min walk 99 %     Max Ex. HR 91 bpm     Max Ex. BP 116/62     2 Minute Post BP 114/62        Oxygen Initial Assessment:   Oxygen Re-Evaluation:   Oxygen Discharge (Final Oxygen Re-Evaluation):   Initial Exercise Prescription:  Initial  Exercise Prescription - 10/31/23 1200       Date of Initial Exercise RX and Referring Provider   Date 10/31/23    Referring Provider Dr. Deatrice Cage      Oxygen   Maintain Oxygen Saturation 88% or higher      Treadmill   MPH 2.8    Grade 1    Minutes 15    METs 3.53      Elliptical   Level 1    Speed 3    Minutes 15    METs 3.32      Rower   Level 3    Minutes 15    METs 3.32      Intensity   THRR 40-80% of Max Heartrate 102-139    Ratings of Perceived Exertion 11-13    Perceived Dyspnea 0-4      Progression   Progression Continue to progress workloads to maintain intensity without signs/symptoms of physical distress.      Resistance Training   Training Prescription Yes    Weight 5lb    Reps 10-15          Perform Capillary Blood Glucose checks as needed.  Exercise Prescription Changes:   Exercise Prescription Changes     Row Name 10/31/23 1200 11/21/23 1400 12/06/23 1200 12/19/23 1100       Response to Exercise   Blood Pressure (Admit) 112/64 102/60 100/60 98/58    Blood Pressure (Exercise) 116/62 124/62 146/64 154/70    Blood Pressure (Exit) 114/62 112/64 108/62 100/56    Heart Rate (Admit) 66 bpm 88 bpm 88 bpm 67 bpm    Heart Rate (Exercise) 91 bpm 120 bpm 120 bpm 125 bpm    Heart Rate (Exit) 74 bpm 83 bpm 83 bpm 89 bpm    Oxygen Saturation (Admit) 97 % -- -- --    Oxygen Saturation (Exercise) 99 % -- -- --    Oxygen Saturation (Exit) 99 % -- -- --    Rating of Perceived Exertion (Exercise) 9 15 17 15     Perceived Dyspnea (Exercise) 0 -- -- --    Symptoms results  none none none    Comments -- 1st week of exercise sessions -- --    Duration -- Progress to 30 minutes of  aerobic without signs/symptoms of physical distress Progress to 30 minutes of  aerobic without signs/symptoms of physical distress Progress to 30 minutes of  aerobic without signs/symptoms of physical distress    Intensity -- THRR unchanged THRR unchanged THRR unchanged       Progression   Progression -- Continue to progress workloads to maintain intensity without signs/symptoms of physical distress. Continue to progress workloads to maintain intensity without signs/symptoms of physical distress. Continue to progress workloads to maintain intensity without signs/symptoms of physical distress.    Average METs -- 3.89 3.8 4.2      Resistance Training   Training Prescription -- Yes Yes Yes    Weight -- 5lb 5lb 5lb    Reps -- 10-15 10-15 10-15      Interval Training   Interval Training -- No No No      Treadmill   MPH -- 2.8 3.8 3.4    Grade -- 2.5 2 4     Minutes -- 15 15 15     METs -- 4.11 5.4 5.48      Arm Ergometer   Level -- -- -- 4    Minutes -- -- -- 15    METs -- -- -- 1      Elliptical   Level -- 3 5 1     Speed -- 5 6 5     Minutes -- 15 15 15     METs -- 5.1 -- 3.4      REL-XR   Level -- 4 -- --    Minutes -- 15 -- --    METs -- 3.2 -- --      Rower   Level -- -- -- 5    Minutes -- -- -- 15    METs -- -- -- 4.21      Oxygen   Maintain Oxygen Saturation -- 88% or higher 88% or higher 88% or higher       Exercise Comments:   Exercise Comments     Row Name 11/12/23 1602           Exercise Comments First full day of exercise!  Patient was oriented to gym and equipment including functions, settings, policies, and procedures.  Patient's individual exercise prescription and treatment plan were reviewed.  All starting workloads were established based on the results of the 6 minute walk test done at initial orientation visit.  The plan for exercise progression was also introduced and progression will be customized based on patient's performance and goals.          Exercise Goals and Review:   Exercise Goals     Row Name 10/31/23 1315             Exercise Goals   Increase Physical Activity Yes       Intervention Provide advice, education, support and counseling about physical activity/exercise needs.;Develop an  individualized exercise prescription for aerobic and resistive training based on initial evaluation findings, risk stratification, comorbidities and participant's personal goals.       Expected Outcomes Short Term: Attend rehab on a regular basis to increase amount of physical activity.;Long Term: Add in home exercise to make exercise part of routine and to increase amount of physical activity.;Long Term: Exercising regularly at least 3-5 days a week.       Increase Strength and Stamina Yes  Intervention Provide advice, education, support and counseling about physical activity/exercise needs.;Develop an individualized exercise prescription for aerobic and resistive training based on initial evaluation findings, risk stratification, comorbidities and participant's personal goals.       Expected Outcomes Short Term: Increase workloads from initial exercise prescription for resistance, speed, and METs.;Short Term: Perform resistance training exercises routinely during rehab and add in resistance training at home;Long Term: Improve cardiorespiratory fitness, muscular endurance and strength as measured by increased METs and functional capacity ( )       Able to understand and use rate of perceived exertion (RPE) scale Yes       Intervention Provide education and explanation on how to use RPE scale       Expected Outcomes Short Term: Able to use RPE daily in rehab to express subjective intensity level;Long Term:  Able to use RPE to guide intensity level when exercising independently       Able to understand and use Dyspnea scale Yes       Intervention Provide education and explanation on how to use Dyspnea scale       Expected Outcomes Long Term: Able to use Dyspnea scale to guide intensity level when exercising independently;Short Term: Able to use Dyspnea scale daily in rehab to express subjective sense of shortness of breath during exertion       Knowledge and understanding of Target Heart Rate Range  (THRR) Yes       Intervention Provide education and explanation of THRR including how the numbers were predicted and where they are located for reference       Expected Outcomes Short Term: Able to use daily as guideline for intensity in rehab;Short Term: Able to state/look up THRR;Long Term: Able to use THRR to govern intensity when exercising independently       Able to check pulse independently Yes       Intervention Provide education and demonstration on how to check pulse in carotid and radial arteries.;Review the importance of being able to check your own pulse for safety during independent exercise       Expected Outcomes Short Term: Able to explain why pulse checking is important during independent exercise;Long Term: Able to check pulse independently and accurately       Understanding of Exercise Prescription Yes       Intervention Provide education, explanation, and written materials on patient's individual exercise prescription       Expected Outcomes Long Term: Able to explain home exercise prescription to exercise independently;Short Term: Able to explain program exercise prescription          Exercise Goals Re-Evaluation :  Exercise Goals Re-Evaluation     Row Name 11/12/23 1603 11/21/23 1415 12/06/23 1222 12/19/23 1118 12/25/23 1115     Exercise Goal Re-Evaluation   Exercise Goals Review Increase Physical Activity;Knowledge and understanding of Target Heart Rate Range (THRR);Able to understand and use rate of perceived exertion (RPE) scale;Understanding of Exercise Prescription;Increase Strength and Stamina;Able to check pulse independently Increase Physical Activity;Understanding of Exercise Prescription;Increase Strength and Stamina Increase Physical Activity;Understanding of Exercise Prescription;Increase Strength and Stamina Increase Physical Activity;Understanding of Exercise Prescription;Increase Strength and Stamina Increase Physical Activity;Increase Strength and  Stamina;Understanding of Exercise Prescription   Comments Reviewed RPE and dyspnea scale, THR and program prescription with pt today.  Pt voiced understanding and was given a copy of goals to take home. Pamela Lara is off to a good start in the program and completed her first week in this review. She  tolerated her exercise prescription well. She increased her workload on the treadmill already to a speed of 2.8 mph and incline of 2.5%. She also already increased to level 3 on the elliptical. She added the XR to her prescription and worked at level 4. We will continue to monitor her progress in the program. Pamela Lara is doing well in rehab. She was recently able to increase her speed on the treadmill from 2. to 3. while maintaining an incline of 2%. She was also able to increase from level 3 to 5 on the elliptical. We will continue to monitor her progress in the program. Pamela Lara continues to do well in rehab. She was recently able to increase her incline on the treadmill from 2 to 4%. She was also able to increase from level 3 to 5 on the rowing machine. We will continue to monitor her progress in the program. Pamela Lara is doing well here at rehab. She likes to swim on days she is not working out here. but with the weather cooling down she is looking to find other exercise. She has a friend who has invited her to walk.   Expected Outcomes Short: Use RPE daily to regulate intensity.  Long: Follow program prescription in THR. Short: Continue to follow current exercise prescription. Long: Continue exercise to improve strength and stamina. Short: Continue to increase treadmill workload. Long: Continue exericse to improve strength and stamina. Short: Continue to increase workloads. Long: Continue exercise to improve strength and stamina. STG: look to walk with friend and increase workload here at rehab. LTG: Continue exercise to improve strength and stamina.      Discharge Exercise Prescription (Final Exercise  Prescription Changes):  Exercise Prescription Changes - 12/19/23 1100       Response to Exercise   Blood Pressure (Admit) 98/58    Blood Pressure (Exercise) 154/70    Blood Pressure (Exit) 100/56    Heart Rate (Admit) 67 bpm    Heart Rate (Exercise) 125 bpm    Heart Rate (Exit) 89 bpm    Rating of Perceived Exertion (Exercise) 15    Symptoms none    Duration Progress to 30 minutes of  aerobic without signs/symptoms of physical distress    Intensity THRR unchanged      Progression   Progression Continue to progress workloads to maintain intensity without signs/symptoms of physical distress.    Average METs 4.2      Resistance Training   Training Prescription Yes    Weight 5lb    Reps 10-15      Interval Training   Interval Training No      Treadmill   MPH 3.4    Grade 4    Minutes 15    METs 5.48      Arm Ergometer   Level 4    Minutes 15    METs 1      Elliptical   Level 1    Speed 5    Minutes 15    METs 3.4      Rower   Level 5    Minutes 15    METs 4.21      Oxygen   Maintain Oxygen Saturation 88% or higher          Nutrition:  Target Goals: Understanding of nutrition guidelines, daily intake of sodium 1500mg , cholesterol 200mg , calories 30% from fat and 7% or less from saturated fats, daily to have 5 or more servings of fruits and vegetables.  Education: Nutrition 1 -  Group instruction provided by verbal, written material, interactive activities, discussions, models, and posters to present general guidelines for heart healthy nutrition including macronutrients, label reading, and promoting whole foods over processed counterparts. Education serves as Pensions consultant of discussion of heart healthy eating for all. Written material provided at class time. Flowsheet Row Cardiac Rehab from 12/20/2023 in [facility] Cardiac and Pulmonary Rehab  Date 12/12/23  Educator jg  Instruction Review Code 1- Verbalizes Understanding     Education: Nutrition 2 -Group  instruction provided by verbal, written material, interactive activities, discussions, models, and posters to present general guidelines for heart healthy nutrition including sodium, cholesterol, and saturated fat. Providing guidance of habit forming to improve blood pressure, cholesterol, and body weight. Written material provided at class time. Flowsheet Row Cardiac Rehab from 12/20/2023 in [facility] Cardiac and Pulmonary Rehab  Date 12/20/23  Educator JG  Instruction Review Code 1- Verbalizes Understanding      Biometrics:  Pre Biometrics - 10/31/23 1316       Pre Biometrics   Height 5' 4.7 (1.643 m)    Weight 178 lb 8 oz (81 kg)    Waist Circumference 36 inches    Hip Circumference 40 inches    Waist to Hip Ratio 0.9 %    BMI (Calculated) 29.99    Single Leg Stand 30 seconds           Nutrition Therapy Plan and Nutrition Goals:   Nutrition Assessments:  MEDIFICTS Score Key: >=70 Need to make dietary changes  40-70 Heart Healthy Diet <= 40 Therapeutic Level Cholesterol Diet  Flowsheet Row Cardiac Rehab from 10/31/2023 in [facility] Cardiac and Pulmonary Rehab  Picture Your Plate Total Score on Admission 63   Picture Your Plate Scores: <59 Unhealthy dietary pattern with much room for improvement. 41-50 Dietary pattern unlikely to meet recommendations for good health and room for improvement. 51-60 More healthful dietary pattern, with some room for improvement.  >60 Healthy dietary pattern, although there may be some specific behaviors that could be improved.    Nutrition Goals Re-Evaluation:  Nutrition Goals Re-Evaluation     Row Name 12/25/23 1122             Goals   Comment Spoke with Pamela Lara about nutrition. She has met with RD and has been logging foods on mynetdiary. She likes it and is able to track her nutrition goals like protein and fiber. She also reads labels and limits sodium and saturated fats.       Expected Outcome STG: Continue to log and portion foods to  meet goals and not exceed in guideline limits of sodium/saturated fat. LTG: FOllow a heart healthy lifestyle          Nutrition Goals Discharge (Final Nutrition Goals Re-Evaluation):  Nutrition Goals Re-Evaluation - 12/25/23 1122       Goals   Comment Spoke with Pamela Lara about nutrition. She has met with RD and has been logging foods on mynetdiary. She likes it and is able to track her nutrition goals like protein and fiber. She also reads labels and limits sodium and saturated fats.    Expected Outcome STG: Continue to log and portion foods to meet goals and not exceed in guideline limits of sodium/saturated fat. LTG: FOllow a heart healthy lifestyle          Psychosocial: Target Goals: Acknowledge presence or absence of significant depression and/or stress, maximize coping skills, provide positive support system. Participant is able to verbalize types and ability to use techniques  and skills needed for reducing stress and depression.   Education: Stress, Anxiety, and Depression - Group verbal and visual presentation to define topics covered.  Reviews how body is impacted by stress, anxiety, and depression.  Also discusses healthy ways to reduce stress and to treat/manage anxiety and depression. Written material provided at class time. Flowsheet Row Cardiac Rehab from 12/20/2023 in [facility] Cardiac and Pulmonary Rehab  Date 11/14/23  Educator sb  Instruction Review Code 1- [employer] Understanding    Education: Sleep Hygiene -Provides group verbal and written instruction about how sleep can affect your health.  Define sleep hygiene, discuss sleep cycles and impact of sleep habits. Review good sleep hygiene tips.   Initial Review & Psychosocial Screening:  Initial Psych Review & Screening - 10/25/23 1342       Initial Review   Current issues with None Identified      Family Dynamics   Good Support System? Yes   husband children     Barriers   Psychosocial barriers to participate in  program There are no identifiable barriers or psychosocial needs.      Screening Interventions   Interventions To provide support and resources with identified psychosocial needs;Provide feedback about the scores to participant    Expected Outcomes Short Term goal: Utilizing psychosocial counselor, staff and physician to assist with identification of specific Stressors or current issues interfering with healing process. Setting desired goal for each stressor or current issue identified.;Long Term Goal: Stressors or current issues are controlled or eliminated.;Short Term goal: Identification and review with participant of any Quality of Life or Depression concerns found by scoring the questionnaire.;Long Term goal: The participant improves quality of Life and PHQ9 Scores as seen by post scores and/or verbalization of changes          Quality of Life Scores:   Quality of Life - 10/31/23 1316       Quality of Life   Select Quality of Life      Quality of Life Scores   Health/Function Pre 23.17 %    Socioeconomic Pre 28.13 %    Psych/Spiritual Pre 26.5 %    Family Pre 30 %    GLOBAL Pre 25.94 %         Scores of 19 and below usually indicate a poorer quality of life in these areas.  A difference of  2-3 points is a clinically meaningful difference.  A difference of 2-3 points in the total score of the Quality of Life Index has been associated with significant improvement in overall quality of life, self-image, physical symptoms, and general health in studies assessing change in quality of life.  PHQ-9: Review Flowsheet  More data exists      11/21/2023 10/31/2023 10/05/2022 09/29/2021 09/21/2020  Depression screen PHQ 2/9  Decreased Interest 0 0 0 0 0  Down, Depressed, Hopeless 0 0 0 0 0  PHQ - 2 Score 0 0 0 0 0  Altered sleeping 0 0 0 - 0  Tired, decreased energy 0 2 3 - 0  Change in appetite 0 0 0 - 0  Feeling bad or failure about yourself  0 0 0 - 0  Trouble concentrating 0 0 0 - 0   Moving slowly or fidgety/restless 0 0 0 - 0  Suicidal thoughts 0 0 0 - 0  PHQ-9 Score 0 2 3 - 0  Difficult doing work/chores Not difficult at all Somewhat difficult Not difficult at all - Not difficult at all  Interpretation of Total Score  Total Score Depression Severity:  1-4 = Minimal depression, 5-9 = Mild depression, 10-14 = Moderate depression, 15-19 = Moderately severe depression, 20-27 = Severe depression   Psychosocial Evaluation and Intervention:  Psychosocial Evaluation - 10/25/23 1513       Psychosocial Evaluation & Interventions   Interventions Encouraged to exercise with the program and follow exercise prescription    Comments There are no barriers to attending the program.  She has support from her husband and children.  She was walking 4 minles a week and swimming some prior to the stent.   She walks 10,000 steps a day during wrok three days a week.  She is ready to get started    Expected Outcomes STG attend all scheduled session, exerciseprogression and set habit of an exercise regimen.  LTG following her exercise regimen    Continue Psychosocial Services  Follow up required by staff          Psychosocial Re-Evaluation:  Psychosocial Re-Evaluation     Row Name 12/25/23 1117             Psychosocial Re-Evaluation   Current issues with None Identified       Comments Pamela Lara denies any anxiety, depression, or stress at this time. Reports she has good support system which has helped her adjust to this new reality. She is sleeping well, 7-8hr most nights and wakes well rested.       Expected Outcomes STG: Attend rehab and exercise for stress relief. LTG: Achieve and maintain a positive outlook on health and daily life       Interventions Encouraged to attend Cardiac Rehabilitation for the exercise       Continue Psychosocial Services  Follow up required by staff          Psychosocial Discharge (Final Psychosocial Re-Evaluation):  Psychosocial Re-Evaluation -  12/25/23 1117       Psychosocial Re-Evaluation   Current issues with None Identified    Comments Pamela Lara denies any anxiety, depression, or stress at this time. Reports she has good support system which has helped her adjust to this new reality. She is sleeping well, 7-8hr most nights and wakes well rested.    Expected Outcomes STG: Attend rehab and exercise for stress relief. LTG: Achieve and maintain a positive outlook on health and daily life    Interventions Encouraged to attend Cardiac Rehabilitation for the exercise    Continue Psychosocial Services  Follow up required by staff          Vocational Rehabilitation: Provide vocational rehab assistance to qualifying candidates.   Vocational Rehab Evaluation & Intervention:  Vocational Rehab - 10/25/23 1348       Initial Vocational Rehab Evaluation & Intervention   Assessment shows need for Vocational Rehabilitation No      Vocational Rehab Re-Evaulation   Comments back to work          Education: Education Goals: Education classes will be provided on a variety of topics geared toward better understanding of heart health and risk factor modification. Participant will state understanding/return demonstration of topics presented as noted by education test scores.  Learning Barriers/Preferences:  Learning Barriers/Preferences - 10/25/23 1348       Learning Barriers/Preferences   Learning Barriers None    Learning Preferences None          General Cardiac Education Topics:  AED/CPR: - Group verbal and written instruction with the use of models to demonstrate the basic use  of the AED with the basic ABC's of resuscitation.   Test and Procedures: - Group verbal and visual presentation and models provide information about basic cardiac anatomy and function. Reviews the testing methods done to diagnose heart disease and the outcomes of the test results. Describes the treatment choices: Medical Management, Angioplasty, or  Coronary Bypass Surgery for treating various heart conditions including Myocardial Infarction, Angina, Valve Disease, and Cardiac Arrhythmias. Written material provided at class time.   Medication Safety: - Group verbal and visual instruction to review commonly prescribed medications for heart and lung disease. Reviews the medication, class of the drug, and side effects. Includes the steps to properly store meds and maintain the prescription regimen. Written material provided at class time.   Intimacy: - Group verbal instruction through game format to discuss how heart and lung disease can affect sexual intimacy. Written material provided at class time.   Know Your Numbers and Heart Failure: - Group verbal and visual instruction to discuss disease risk factors for cardiac and pulmonary disease and treatment options.  Reviews associated critical values for Overweight/Obesity, Hypertension, Cholesterol, and Diabetes.  Discusses basics of heart failure: signs/symptoms and treatments.  Introduces Heart Failure Zone chart for action plan for heart failure. Written material provided at class time.   Infection Prevention: - Provides verbal and written material to individual with discussion of infection control including proper hand washing and proper equipment cleaning during exercise session. Flowsheet Row Cardiac Rehab from 12/20/2023 in [facility] Cardiac and Pulmonary Rehab  Date 10/31/23  Educator [facility]  Instruction Review Code 1- Verbalizes Understanding    Falls Prevention: - Provides verbal and written material to individual with discussion of falls prevention and safety. Flowsheet Row Cardiac Rehab from 12/20/2023 in [facility] Cardiac and Pulmonary Rehab  Date 10/31/23  Educator [facility]  Instruction Review Code 1- Verbalizes Understanding    Other: -Provides group and verbal instruction on various topics (see comments)   Knowledge Questionnaire Score:  Knowledge Questionnaire Score - 10/31/23 1318        Knowledge Questionnaire Score   Pre Score 25/26          Core Components/Risk Factors/Patient Goals at Admission:  Personal Goals and Risk Factors at Admission - 10/25/23 1348       Core Components/Risk Factors/Patient Goals on Admission    Weight Management Yes;Weight Loss    Intervention Weight Management: Develop a combined nutrition and exercise program designed to reach desired caloric intake, while maintaining appropriate intake of nutrient and fiber, sodium and fats, and appropriate energy expenditure required for the weight goal.;Weight Management: Provide education and appropriate resources to help participant work on and attain dietary goals.    Admit Weight 177 lb (80.3 kg)    Goal Weight: Short Term 176 lb (79.8 kg)    Goal Weight: Long Term 162 lb (73.5 kg)    Expected Outcomes Short Term: Continue to assess and modify interventions until short term weight is achieved;Long Term: Adherence to nutrition and physical activity/exercise program aimed toward attainment of established weight goal;Weight Loss: Understanding of general recommendations for a balanced deficit meal plan, which promotes 1-2 lb weight loss per week and includes a negative energy balance of [phone] kcal/d    Lipids Yes    Intervention Provide education and support for participant on nutrition & aerobic/resistive exercise along with prescribed medications to achieve LDL 70mg , HDL >40mg .    Expected Outcomes Short Term: Participant states understanding of desired cholesterol values and is compliant with medications prescribed. Participant  is following exercise prescription and nutrition guidelines.;Long Term: Cholesterol controlled with medications as prescribed, with individualized exercise RX and with personalized nutrition plan. Value goals: LDL < 70mg , HDL > 40 mg.          Education:Diabetes - Individual verbal and written instruction to review signs/symptoms of diabetes, desired ranges of  glucose level fasting, after meals and with exercise. Acknowledge that pre and post exercise glucose checks will be done for 3 sessions at entry of program.   Core Components/Risk Factors/Patient Goals Review:   Goals and Risk Factor Review     Row Name 12/25/23 1124             Core Components/Risk Factors/Patient Goals Review   Personal Goals Review Hypertension;Weight Management/Obesity       Review Pamela Lara does not check her blood pressure on a regular basis because it stable. 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 She takes her meds as prescribed. She is logging her foods and following guidelines given by RD to build muscle and keep weight controlled       Expected Outcomes STG: Continue to log and meet protein goals. LTG: manage risk factors independently          Core Components/Risk Factors/Patient Goals at Discharge (Final Review):   Goals and Risk Factor Review - 12/25/23 1124       Core Components/Risk Factors/Patient Goals Review   Personal Goals Review Hypertension;Weight Management/Obesity    Review Pamela Lara does not check her blood pressure on a regular basis because it stable. 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 She takes her meds as prescribed. She is logging her foods and following guidelines given by RD to build muscle and keep weight controlled    Expected Outcomes STG: Continue to log and meet protein goals. LTG: manage risk factors independently          ITP Comments:  ITP Comments     Row Name 10/25/23 1427 10/31/23 1203 11/12/23 1602 11/28/23 0950 12/26/23 0954   ITP Comments Virtual orientation call completed today. shehas an appointment on Date: 10/31/2023  for EP eval and gym Orientation.  Documentation of diagnosis can be found in [facility] 10/10/2023 . Completed and gym orientation for cardiac rehab. Initial ITP created and sent for review to Dr. Oneil Pinal, Medical Director. First full day of exercise!  Patient was oriented to gym and  equipment including functions, settings, policies, and procedures.  Patient's individual exercise prescription and treatment plan were reviewed.  All starting workloads were established based on the results of the 6 minute walk test done at initial orientation visit.  The plan for exercise progression was also introduced and progression will be customized based on patient's performance and goals. 30 Day review completed. Medical Director ITP review done, changes made as directed, and signed approval by Medical Director. New to program. 30 Day review completed. Medical Director ITP review done; changes made as directed and signed approval by Medical Director.      Comments: 30 day review

## 2023-12-27 ENCOUNTER — Encounter: Admitting: Emergency Medicine

## 2023-12-27 DIAGNOSIS — Z955 Presence of coronary angioplasty implant and graft: Secondary | ICD-10-CM

## 2023-12-27 NOTE — Progress Notes (Signed)
 Daily Session Note  Patient Details  Name: Pamela Lara MRN: [id number] Date of Birth: Nov 02, 1960 Referring Provider:   Flowsheet Row Cardiac Rehab from 10/31/2023 in [facility] Cardiac and Pulmonary Rehab  Referring Provider Dr. Deatrice Cage    Encounter Date: 12/27/2023  Check In:  Session Check In - 12/27/23 1119       Check-In   Supervising physician immediately available to respond to emergencies See telemetry face sheet for immediately available ER MD    Location [facility]-Cardiac & Pulmonary Rehab    Staff Present Devaughn Jaeger, BS, Exercise Physiologist;Maxon Conetta BS, Exercise Physiologist;Joseph Rolinda RCP,RRT,BSRT;Michael Walrath RN,BSN    Virtual Visit No    Medication changes reported     No    Fall or balance concerns reported    No    Tobacco Cessation No Change    Warm-up and Cool-down Performed on first and last piece of equipment    Resistance Training Performed Yes    VAD Patient? No    PAD/SET Patient? No      Pain Assessment   Currently in Pain? No/denies             Social History   Tobacco Use  Smoking Status Former   Current packs/day: 0.00   Average packs/day: 0.3 packs/day for 38.0 years (9.5 ttl pk-yrs)   Types: Cigarettes   Start date: 06/29/1979   Quit date: 06/28/2017   Years since quitting: 6.5  Smokeless Tobacco Never  Tobacco Comments   since age 69/  07-27-2017 per pt quit smoking 02/ 28/ 2019 [place]    Goals Met:  Independence with exercise equipment Exercise tolerated well No report of concerns or symptoms today Strength training completed today  Goals Unmet:  Not Applicable  Comments: Pt able to follow exercise prescription today without complaint.  Will continue to monitor for progression.    Dr. Oneil Pinal is Medical Director for [facility] Cardiac Rehabilitation.  Dr. Fuad Aleskerov is Medical Director for [facility] Pulmonary Rehabilitation.

## 2024-01-01 ENCOUNTER — Other Ambulatory Visit: Payer: Self-pay

## 2024-01-02 ENCOUNTER — Encounter: Attending: Cardiovascular Disease

## 2024-01-02 DIAGNOSIS — Z955 Presence of coronary angioplasty implant and graft: Secondary | ICD-10-CM | POA: Diagnosis not present

## 2024-01-02 NOTE — Progress Notes (Signed)
 Daily Session Note  Patient Details  Name: Pamela Lara MRN: [id number] Date of Birth: 04-16-1961 Referring Provider:   Flowsheet Row Cardiac Rehab from 10/31/2023 in [facility] Cardiac and Pulmonary Rehab  Referring Provider Dr. Deatrice Cage    Encounter Date: 01/02/2024  Check In:  Session Check In - 01/02/24 0736       Check-In   Supervising physician immediately available to respond to emergencies See telemetry face sheet for immediately available ER MD    Location [facility]-Cardiac & Pulmonary Rehab    Staff Present Burnard Davenport RN,BSN,MPA;Maxon Conetta BS, Exercise Physiologist;Joseph Rolinda RCP,RRT,BSRT;Jason Elnor RDN,LDN    Virtual Visit No    Medication changes reported     No    Fall or balance concerns reported    No    Tobacco Cessation No Change    Warm-up and Cool-down Performed on first and last piece of equipment    Resistance Training Performed Yes    VAD Patient? No    PAD/SET Patient? No      Pain Assessment   Currently in Pain? No/denies             Social History   Tobacco Use  Smoking Status Former   Current packs/day: 0.00   Average packs/day: 0.3 packs/day for 38.0 years (9.5 ttl pk-yrs)   Types: Cigarettes   Start date: 06/29/1979   Quit date: 06/28/2017   Years since quitting: 6.5  Smokeless Tobacco Never  Tobacco Comments   since age 89/  07-27-2017 per pt quit smoking 02/ 28/ 2019 [place]    Goals Met:  Independence with exercise equipment Exercise tolerated well No report of concerns or symptoms today Strength training completed today  Goals Unmet:  Not Applicable  Comments: Pt able to follow exercise prescription today without complaint.  Will continue to monitor for progression.    Dr. Oneil Pinal is Medical Director for [facility] Cardiac Rehabilitation.  Dr. Fuad Aleskerov is Medical Director for [facility] Pulmonary Rehabilitation.

## 2024-01-04 ENCOUNTER — Ambulatory Visit

## 2024-01-09 ENCOUNTER — Encounter

## 2024-01-09 DIAGNOSIS — Z955 Presence of coronary angioplasty implant and graft: Secondary | ICD-10-CM

## 2024-01-09 NOTE — Progress Notes (Signed)
 Daily Session Note  Patient Details  Name: Pamela Lara MRN: [id number] Date of Birth: 1960/11/15 Referring Provider:   Flowsheet Row Cardiac Rehab from 10/31/2023 in [facility] Cardiac and Pulmonary Rehab  Referring Provider Dr. Deatrice Cage    Encounter Date: 01/09/2024  Check In:  Session Check In - 01/09/24 0734       Check-In   Supervising physician immediately available to respond to emergencies See telemetry face sheet for immediately available ER MD    Location [facility]-Cardiac & Pulmonary Rehab    Staff Present Burnard Davenport RN,BSN,MPA;Maxon Conetta BS, Exercise Physiologist;Joseph Rolinda RCP,RRT,BSRT;Jason Elnor RDN,LDN    Virtual Visit No    Medication changes reported     No    Fall or balance concerns reported    No    Tobacco Cessation No Change    Warm-up and Cool-down Performed on first and last piece of equipment    Resistance Training Performed Yes    VAD Patient? No    PAD/SET Patient? No      Pain Assessment   Currently in Pain? No/denies             Social History   Tobacco Use  Smoking Status Former   Current packs/day: 0.00   Average packs/day: 0.3 packs/day for 38.0 years (9.5 ttl pk-yrs)   Types: Cigarettes   Start date: 06/29/1979   Quit date: 06/28/2017   Years since quitting: 6.5  Smokeless Tobacco Never  Tobacco Comments   since age 44/  07-27-2017 per pt quit smoking 02/ 28/ 2019 [place]    Goals Met:  Independence with exercise equipment Exercise tolerated well No report of concerns or symptoms today Strength training completed today  Goals Unmet:  Not Applicable  Comments: Pt able to follow exercise prescription today without complaint.  Will continue to monitor for progression.    Dr. Oneil Pinal is Medical Director for [facility] Cardiac Rehabilitation.  Dr. Fuad Aleskerov is Medical Director for [facility] Pulmonary Rehabilitation.

## 2024-01-11 ENCOUNTER — Encounter

## 2024-01-11 DIAGNOSIS — Z955 Presence of coronary angioplasty implant and graft: Secondary | ICD-10-CM | POA: Diagnosis not present

## 2024-01-11 NOTE — Progress Notes (Signed)
 Daily Session Note  Patient Details  Name: Pamela Lara MRN: [id number] Date of Birth: 10-05-1960 Referring Provider:   Flowsheet Row Cardiac Rehab from 10/31/2023 in [facility] Cardiac and Pulmonary Rehab  Referring Provider Dr. Deatrice Cage    Encounter Date: 01/11/2024  Check In:  Session Check In - 01/11/24 0731       Check-In   Supervising physician immediately available to respond to emergencies See telemetry face sheet for immediately available ER MD    Location [facility]-Cardiac & Pulmonary Rehab    Staff Present Burnard Davenport RN,BSN,MPA;Joseph Hood RCP,RRT,BSRT;Maxon Conetta BS, Exercise Physiologist;Noah Tickle, BS, Exercise Physiologist    Virtual Visit No    Medication changes reported     No    Fall or balance concerns reported    No    Tobacco Cessation No Change    Warm-up and Cool-down Performed on first and last piece of equipment    Resistance Training Performed Yes    VAD Patient? No    PAD/SET Patient? No      Pain Assessment   Currently in Pain? No/denies             Social History   Tobacco Use  Smoking Status Former   Current packs/day: 0.00   Average packs/day: 0.3 packs/day for 38.0 years (9.5 ttl pk-yrs)   Types: Cigarettes   Start date: 06/29/1979   Quit date: 06/28/2017   Years since quitting: 6.5  Smokeless Tobacco Never  Tobacco Comments   since age 45/  07-27-2017 per pt quit smoking 02/ 28/ 2019 [place]    Goals Met:  Independence with exercise equipment Exercise tolerated well No report of concerns or symptoms today Strength training completed today  Goals Unmet:  Not Applicable  Comments: Pt able to follow exercise prescription today without complaint.  Will continue to monitor for progression.    Dr. Oneil Pinal is Medical Director for [facility] Cardiac Rehabilitation.  Dr. Fuad Aleskerov is Medical Director for [facility] Pulmonary Rehabilitation.

## 2024-01-14 ENCOUNTER — Encounter

## 2024-01-14 DIAGNOSIS — Z955 Presence of coronary angioplasty implant and graft: Secondary | ICD-10-CM

## 2024-01-14 NOTE — Progress Notes (Signed)
 Daily Session Note  Patient Details  Name: Pamela Lara MRN: [id number] Date of Birth: 1960-11-04 Referring Provider:   Flowsheet Row Cardiac Rehab from 10/31/2023 in [facility] Cardiac and Pulmonary Rehab  Referring Provider Dr. Deatrice Cage    Encounter Date: 01/14/2024  Check In:  Session Check In - 01/14/24 0743       Check-In   Supervising physician immediately available to respond to emergencies See telemetry face sheet for immediately available ER MD    Location [facility]-Cardiac & Pulmonary Rehab    Staff Present Burnard Davenport RN,BSN,MPA;Joseph [facility] Dyane BS, ACSM CEP, Exercise Physiologist;Noah Tickle, BS, Exercise Physiologist    Virtual Visit No    Medication changes reported     No    Fall or balance concerns reported    No    Tobacco Cessation No Change    Warm-up and Cool-down Performed on first and last piece of equipment    Resistance Training Performed Yes    VAD Patient? No    PAD/SET Patient? No      Pain Assessment   Currently in Pain? No/denies             Social History   Tobacco Use  Smoking Status Former   Current packs/day: 0.00   Average packs/day: 0.3 packs/day for 38.0 years (9.5 ttl pk-yrs)   Types: Cigarettes   Start date: 06/29/1979   Quit date: 06/28/2017   Years since quitting: 6.5  Smokeless Tobacco Never  Tobacco Comments   since age 89/  07-27-2017 per pt quit smoking 02/ 28/ 2019 [place]    Goals Met:  Independence with exercise equipment Exercise tolerated well No report of concerns or symptoms today Strength training completed today  Goals Unmet:  Not Applicable  Comments: Pt able to follow exercise prescription today without complaint.  Will continue to monitor for progression.    Dr. Oneil Pinal is Medical Director for [facility] Cardiac Rehabilitation.  Dr. Fuad Aleskerov is Medical Director for [facility] Pulmonary Rehabilitation.

## 2024-01-16 ENCOUNTER — Encounter

## 2024-01-16 ENCOUNTER — Other Ambulatory Visit: Payer: Self-pay

## 2024-01-16 DIAGNOSIS — E785 Hyperlipidemia, unspecified: Secondary | ICD-10-CM

## 2024-01-16 DIAGNOSIS — E782 Mixed hyperlipidemia: Secondary | ICD-10-CM

## 2024-01-16 DIAGNOSIS — Z955 Presence of coronary angioplasty implant and graft: Secondary | ICD-10-CM

## 2024-01-16 MED ORDER — FLUZONE 0.5 ML IM SUSY
0.5000 mL | PREFILLED_SYRINGE | Freq: Once | INTRAMUSCULAR | 0 refills | Status: AC
  Filled 2024-01-16: qty 0.5, 1d supply, fill #0

## 2024-01-16 MED ORDER — COMIRNATY 30 MCG/0.3ML IM SUSY
0.3000 mL | PREFILLED_SYRINGE | Freq: Once | INTRAMUSCULAR | 0 refills | Status: AC
Start: 2024-01-16 — End: 2024-01-17
  Filled 2024-01-16: qty 0.3, 1d supply, fill #0

## 2024-01-16 NOTE — Progress Notes (Signed)
 Daily Session Note  Patient Details  Name: Pamela Lara MRN: [id number] Date of Birth: 11-01-1960 Referring Provider:   Flowsheet Row Cardiac Rehab from 10/31/2023 in [facility] Cardiac and Pulmonary Rehab  Referring Provider Dr. Deatrice Cage    Encounter Date: 01/16/2024  Check In:  Session Check In - 01/16/24 0748       Check-In   Supervising physician immediately available to respond to emergencies See telemetry face sheet for immediately available ER MD    Location [facility]-Cardiac & Pulmonary Rehab    Staff Present Burnard Davenport RN,BSN,MPA;Joseph [facility] RCP,RRT,BSRT;Margaret Best, MS, Exercise Physiologist    Virtual Visit No    Medication changes reported     No    Fall or balance concerns reported    No    Tobacco Cessation No Change    Warm-up and Cool-down Performed on first and last piece of equipment    Resistance Training Performed Yes    VAD Patient? No    PAD/SET Patient? No      Pain Assessment   Currently in Pain? No/denies             Social History   Tobacco Use  Smoking Status Former   Current packs/day: 0.00   Average packs/day: 0.3 packs/day for 38.0 years (9.5 ttl pk-yrs)   Types: Cigarettes   Start date: 06/29/1979   Quit date: 06/28/2017   Years since quitting: 6.5  Smokeless Tobacco Never  Tobacco Comments   since age 37/  07-27-2017 per pt quit smoking 02/ 28/ 2019 [place]    Goals Met:  Independence with exercise equipment Exercise tolerated well No report of concerns or symptoms today Strength training completed today  Goals Unmet:  Not Applicable  Comments: Pt able to follow exercise prescription today without complaint.  Will continue to monitor for progression.    Dr. Oneil Pinal is Medical Director for [facility] Cardiac Rehabilitation.  Dr. Fuad Aleskerov is Medical Director for [facility] Pulmonary Rehabilitation.

## 2024-01-18 ENCOUNTER — Encounter

## 2024-01-21 ENCOUNTER — Encounter

## 2024-01-21 DIAGNOSIS — Z955 Presence of coronary angioplasty implant and graft: Secondary | ICD-10-CM | POA: Diagnosis not present

## 2024-01-21 NOTE — Progress Notes (Signed)
 Daily Session Note  Patient Details  Name: Pamela Lara MRN: [id number] Date of Birth: 14-Feb-1961 Referring Provider:   Flowsheet Row Cardiac Rehab from 10/31/2023 in [facility] Cardiac and Pulmonary Rehab  Referring Provider Dr. Deatrice Cage    Encounter Date: 01/21/2024  Check In:  Session Check In - 01/21/24 0739       Check-In   Supervising physician immediately available to respond to emergencies See telemetry face sheet for immediately available ER MD    Location [facility]-Cardiac & Pulmonary Rehab    Staff Present Burnard Davenport RN,BSN,MPA;[facility] Dyane BS, ACSM CEP, Exercise Physiologist;Jason Elnor RDN,LDN    Virtual Visit No    Medication changes reported     No    Fall or balance concerns reported    No    Tobacco Cessation No Change    Warm-up and Cool-down Performed on first and last piece of equipment    Resistance Training Performed Yes    VAD Patient? No    PAD/SET Patient? No      Pain Assessment   Currently in Pain? No/denies             Social History   Tobacco Use  Smoking Status Former   Current packs/day: 0.00   Average packs/day: 0.3 packs/day for 38.0 years (9.5 ttl pk-yrs)   Types: Cigarettes   Start date: 06/29/1979   Quit date: 06/28/2017   Years since quitting: 6.5  Smokeless Tobacco Never  Tobacco Comments   since age 24/  07-27-2017 per pt quit smoking 02/ 28/ 2019 cold [place]    Goals Met:  Independence with exercise equipment Exercise tolerated well No report of concerns or symptoms today Strength training completed today  Goals Unmet:  Not Applicable  Comments: Pt able to follow exercise prescription today without complaint.  Will continue to monitor for progression.    Dr. Oneil Pinal is Medical Director for [facility] Cardiac Rehabilitation.  Dr. Fuad Aleskerov is Medical Director for [facility] Pulmonary Rehabilitation.

## 2024-01-23 ENCOUNTER — Encounter

## 2024-01-23 DIAGNOSIS — Z955 Presence of coronary angioplasty implant and graft: Secondary | ICD-10-CM

## 2024-01-23 NOTE — Progress Notes (Signed)
 Daily Session Note  Patient Details  Name: Pamela Lara MRN: [id number] Date of Birth: 06-01-1960 Referring Provider:   Flowsheet Row Cardiac Rehab from 10/31/2023 in [facility] Cardiac and Pulmonary Rehab  Referring Provider Dr. Deatrice Cage    Encounter Date: 01/23/2024  Check In:  Session Check In - 01/23/24 0733       Check-In   Supervising physician immediately available to respond to emergencies See telemetry face sheet for immediately available ER MD    Location [facility]-Cardiac & Pulmonary Rehab    Staff Present Burnard Davenport [facility] Peggi, RN, DNP, NE-BC;Maxon Conetta BS, Exercise Physiologist;[employer], MS, Exercise Physiologist    Virtual Visit No    Medication changes reported     No    Fall or balance concerns reported    No    Tobacco Cessation No Change    Warm-up and Cool-down Performed on first and last piece of equipment    Resistance Training Performed Yes    VAD Patient? No    PAD/SET Patient? No      Pain Assessment   Currently in Pain? No/denies             Social History   Tobacco Use  Smoking Status Former   Current packs/day: 0.00   Average packs/day: 0.3 packs/day for 38.0 years (9.5 ttl pk-yrs)   Types: Cigarettes   Start date: 06/29/1979   Quit date: 06/28/2017   Years since quitting: 6.5  Smokeless Tobacco Never  Tobacco Comments   since age 81/  07-27-2017 per pt quit smoking 02/ 28/ 2019 cold [place]    Goals Met:  Independence with exercise equipment Exercise tolerated well No report of concerns or symptoms today Strength training completed today  Goals Unmet:  Not Applicable  Comments: Pt able to follow exercise prescription today without complaint.  Will continue to monitor for progression.    Dr. Oneil Pinal is Medical Director for [facility] Cardiac Rehabilitation.  Dr. Fuad Aleskerov is Medical Director for [facility] Pulmonary Rehabilitation.

## 2024-01-23 NOTE — Progress Notes (Signed)
 Cardiac Individual Treatment Plan  Patient Details  Name: RASHI GRANIER MRN: [id number] Date of Birth: 11/25/1960 Referring Provider:   Flowsheet Row Cardiac Rehab from 10/31/2023 in [facility] Cardiac and Pulmonary Rehab  Referring Provider Dr. Deatrice Cage    Initial Encounter Date:  Flowsheet Row Cardiac Rehab from 10/31/2023 in [facility] Cardiac and Pulmonary Rehab  Date 10/31/23    Visit Diagnosis: Status post coronary artery stent placement  Patient's Home Medications on Admission:  Current Outpatient Medications:    acetaminophen  (TYLENOL ) 500 MG tablet, Take 1,000 mg by mouth every 6 (six) hours as needed for moderate pain (pain score 4-6) or mild pain (pain score 1-3)., Disp: , Rfl:    aspirin  EC 81 MG tablet, Take 81 mg by mouth daily. Swallow whole., Disp: , Rfl:    atorvastatin  (LIPITOR) 80 MG tablet, Take 1 tablet (80 mg total) by mouth daily., Disp: 90 tablet, Rfl: 3   calcium  carbonate (TUMS - DOSED IN MG ELEMENTAL CALCIUM ) 500 MG chewable tablet, Chew 1 tablet by mouth as needed for indigestion or heartburn., Disp: , Rfl:    Cholecalciferol  (VITAMIN D3) 1000 UNITS CAPS, Take 1,000 Units by mouth daily., Disp: , Rfl:    influenza vac split quadrivalent PF (FLUARIX  QUADRIVALENT) 0.5 ML injection, Inject into the muscle., Disp: 0.5 mL, Rfl: 0   Korean Ginseng 1000 MG TABS, Take 1,000 mg by mouth daily. , Disp: , Rfl:    Multiple Vitamin (MULTIVITAMIN) tablet, Take 1 tablet by mouth daily., Disp: , Rfl:    nitroGLYCERIN  (NITROSTAT ) 0.4 MG SL tablet, Place 1 tablet (0.4 mg total) under the tongue every 5 (five) minutes as needed for chest pain., Disp: 25 tablet, Rfl: 3   Omega-3 Fatty Acids (FISH OIL) 1000 MG CAPS, Take 1,800 mg by mouth daily. 900 mg each, Disp: , Rfl:    omeprazole  (PRILOSEC) 20 MG capsule, Take 20 mg by mouth daily. (Patient taking differently: Take 20 mg by mouth daily. Taking as needed), Disp: , Rfl:    prasugrel  (EFFIENT ) 10 MG TABS tablet, Take 1 tablet (10  mg total) by mouth daily., Disp: 90 tablet, Rfl: 3   Psyllium (METAMUCIL FIBER PO), Take 15 mLs by mouth every evening., Disp: , Rfl:   Past Medical History: Past Medical History:  Diagnosis Date   Arthritis    right shoulder   BPPV (benign paroxysmal positional vertigo)    CAD (coronary artery disease)    a. 09/2023 Cor CTA: Sev prox/mid LAD dzs w/ FFRct 0.64; b. 09/2023 PCI: LM 99p/m (3.5x20 Synergy DES), 35m, D1 99 (jailed-->DCBA), RI nl, LCX nl, RCA nl, EF 55-65%.   Congenital bowed legs    s/p  surgical correction   Diverticulitis, colon    recurrent   Dysplastic nevus 09/09/2018   R mid post thigh   Dysplastic nevus 07/09/2019   R buttock,moderate atypia   Dysplastic nevus 07/09/2019   L buttock, moderate atypia   Dysplastic nevus 07/09/2019   R lat popliteal, moderate atypia   Ectopic pregnancy 01/29/2000   GERD (gastroesophageal reflux disease)    watch diet (as needed takes apple cider vinager)   History of colonic diverticulitis    hx multiple acute diverticulitis episodes treated medically   History of ectopic pregnancy    History of Helicobacter pylori infection 2013  approx.   Melanoma (HCC) 04/19/2018   R prox bicep (MMIS)   PONV (postoperative nausea and vomiting)    Seasonal affective disorder     Tobacco Use: Social  History   Tobacco Use  Smoking Status Former   Current packs/day: 0.00   Average packs/day: 0.3 packs/day for 38.0 years (9.5 ttl pk-yrs)   Types: Cigarettes   Start date: 06/29/1979   Quit date: 06/28/2017   Years since quitting: 6.5  Smokeless Tobacco Never  Tobacco Comments   since age 63/  07-27-2017 per pt quit smoking 02/ 28/ 2019 cold [place]    Labs: Review Flowsheet  More data exists      Latest Ref Rng & Units 09/20/2020 09/21/2020 09/29/2021 10/05/2022 11/20/2023  Labs for ITP Cardiac and Pulmonary Rehab  Cholestrol 0 - 200 mg/dL 777  - 776  804  838   LDL (calc) 0 - 99 mg/dL 885  - - 894  78   Direct LDL mg/dL - - 860.9  863.9   -  HDL-C >40 mg/dL 51  - 43.99  45.19  48   Trlycerides <150 mg/dL 714  - 751.9  821.9  824   Hemoglobin A1c 4.6 - 6.5 % - 5.3  5.4  5.4  -     Exercise Target Goals: Exercise Program Goal: Individual exercise prescription set using results from initial 6 min walk test and THRR while considering  patient's activity barriers and safety.   Exercise Prescription Goal: Initial exercise prescription builds to 30-45 minutes a day of aerobic activity, 2-3 days per week.  Home exercise guidelines will be given to patient during program as part of exercise prescription that the participant will acknowledge.   Education: Aerobic Exercise: - Group verbal and visual presentation on the components of exercise prescription. Introduces F.I.T.T principle from ACSM for exercise prescriptions.  Reviews F.I.T.T. principles of aerobic exercise including progression. Written material provided at class time.   Education: Resistance Exercise: - Group verbal and visual presentation on the components of exercise prescription. Introduces F.I.T.T principle from ACSM for exercise prescriptions  Reviews F.I.T.T. principles of resistance exercise including progression. Written material provided at class time. Flowsheet Row Cardiac Rehab from 12/20/2023 in [facility] Cardiac and Pulmonary Rehab  Date 11/28/23  Educator [employer]  Instruction Review Code 1- [employer] Understanding     Education: Exercise & Equipment Safety: - Individual verbal instruction and demonstration of equipment use and safety with use of the equipment. Flowsheet Row Cardiac Rehab from 12/20/2023 in [facility] Cardiac and Pulmonary Rehab  Date 10/31/23  Educator [facility]  Instruction Review Code 1- Verbalizes Understanding    Education: Exercise Physiology & General Exercise Guidelines: - Group verbal and written instruction with models to review the exercise physiology of the cardiovascular system and associated critical values. Provides general exercise  guidelines with specific guidelines to those with heart or lung disease. Written material provided at class time. Flowsheet Row Cardiac Rehab from 12/20/2023 in [facility] Cardiac and Pulmonary Rehab  Date 11/21/23  Educator nt  Instruction Review Code 1- TEFL teacher Understanding    Education: Flexibility, Balance, Mind/Body Relaxation: - Group verbal and visual presentation with interactive activity on the components of exercise prescription. Introduces F.I.T.T principle from ACSM for exercise prescriptions. Reviews F.I.T.T. principles of flexibility and balance exercise training including progression. Also discusses the mind body connection.  Reviews various relaxation techniques to help reduce and manage stress (i.e. Deep breathing, progressive muscle relaxation, and visualization). Balance handout provided to take home. Written material provided at class time. Flowsheet Row Cardiac Rehab from 12/20/2023 in [facility] Cardiac and Pulmonary Rehab  Date 11/28/23  Educator [facility]  Instruction Review Code 1- Verbalizes Understanding    Activity Barriers &  Risk Stratification:  Activity Barriers & Cardiac Risk Stratification - 10/31/23 1205       Activity Barriers & Cardiac Risk Stratification   Activity Barriers None    Cardiac Risk Stratification Moderate          6 Minute Walk:  6 Minute Walk     Row Name 10/31/23 1204         6 Minute Walk   Phase Initial     Distance 1500 feet     Walk Time 6 minutes     # of Rest Breaks 0     MPH 2.84     METS 3.32     RPE 9     Perceived Dyspnea  0     VO2 Peak 11.6     Symptoms No     Resting HR 66 bpm     Resting BP 112/64     Resting Oxygen Saturation  97 %     Exercise Oxygen Saturation  during 6 min walk 99 %     Max Ex. HR 91 bpm     Max Ex. BP 116/62     2 Minute Post BP 114/62        Oxygen Initial Assessment:   Oxygen Re-Evaluation:   Oxygen Discharge (Final Oxygen Re-Evaluation):   Initial Exercise Prescription:  Initial  Exercise Prescription - 10/31/23 1200       Date of Initial Exercise RX and Referring Provider   Date 10/31/23    Referring Provider Dr. Deatrice Cage      Oxygen   Maintain Oxygen Saturation 88% or higher      Treadmill   MPH 2.8    Grade 1    Minutes 15    METs 3.53      Elliptical   Level 1    Speed 3    Minutes 15    METs 3.32      Rower   Level 3    Minutes 15    METs 3.32      Intensity   THRR 40-80% of Max Heartrate 102-139    Ratings of Perceived Exertion 11-13    Perceived Dyspnea 0-4      Progression   Progression Continue to progress workloads to maintain intensity without signs/symptoms of physical distress.      Resistance Training   Training Prescription Yes    Weight 5lb    Reps 10-15          Perform Capillary Blood Glucose checks as needed.  Exercise Prescription Changes:   Exercise Prescription Changes     Row Name 10/31/23 1200 11/21/23 1400 12/06/23 1200 12/19/23 1100 01/02/24 1500     Response to Exercise   Blood Pressure (Admit) 112/64 102/60 100/60 98/58 124/62   Blood Pressure (Exercise) 116/62 124/62 146/64 154/70 --   Blood Pressure (Exit) 114/62 112/64 108/62 100/56 98/58   Heart Rate (Admit) 66 bpm 88 bpm 88 bpm 67 bpm 60 bpm   Heart Rate (Exercise) 91 bpm 120 bpm 120 bpm 125 bpm 127 bpm   Heart Rate (Exit) 74 bpm 83 bpm 83 bpm 89 bpm 92 bpm   Oxygen Saturation (Admit) 97 % -- -- -- --   Oxygen Saturation (Exercise) 99 % -- -- -- --   Oxygen Saturation (Exit) 99 % -- -- -- --   Rating of Perceived Exertion (Exercise) 9 15 17 15 15    Perceived Dyspnea (Exercise) 0 -- -- -- --   Symptoms  results none none none none   Comments -- 1st week of exercise sessions -- -- --   Duration -- Progress to 30 minutes of  aerobic without signs/symptoms of physical distress Progress to 30 minutes of  aerobic without signs/symptoms of physical distress Progress to 30 minutes of  aerobic without signs/symptoms of physical distress  Progress to 30 minutes of  aerobic without signs/symptoms of physical distress   Intensity -- THRR unchanged THRR unchanged THRR unchanged THRR unchanged     Progression   Progression -- Continue to progress workloads to maintain intensity without signs/symptoms of physical distress. Continue to progress workloads to maintain intensity without signs/symptoms of physical distress. Continue to progress workloads to maintain intensity without signs/symptoms of physical distress. Continue to progress workloads to maintain intensity without signs/symptoms of physical distress.   Average METs -- 3.89 3.8 4.2 4.52     Resistance Training   Training Prescription -- Yes Yes Yes Yes   Weight -- 5lb 5lb 5lb 5lb   Reps -- 10-15 10-15 10-15 10-15     Interval Training   Interval Training -- No No No No     Treadmill   MPH -- 2.8 3.8 3.4 3   Grade -- 2.5 2 4 6    Minutes -- 15 15 15 15    METs -- 4.11 5.4 5.48 5.78     Arm Ergometer   Level -- -- -- 4 --   Minutes -- -- -- 15 --   METs -- -- -- 1 --     Elliptical   Level -- 3 5 1 4    Speed -- 5 6 5 2    Minutes -- 15 15 15 15    METs -- 5.1 -- 3.4 3.5     REL-XR   Level -- 4 -- -- --   Minutes -- 15 -- -- --   METs -- 3.2 -- -- --     Rower   Level -- -- -- 5 10   Minutes -- -- -- 15 15   METs -- -- -- 4.21 6.18     Oxygen   Maintain Oxygen Saturation -- 88% or higher 88% or higher 88% or higher 88% or higher    Row Name 01/17/24 1300             Response to Exercise   Blood Pressure (Admit) 118/58       Blood Pressure (Exit) 100/64       Heart Rate (Admit) 80 bpm       Heart Rate (Exercise) 113 bpm       Heart Rate (Exit) 82 bpm       Rating of Perceived Exertion (Exercise) 16       Symptoms none       Duration Continue with 30 min of aerobic exercise without signs/symptoms of physical distress.       Intensity THRR unchanged         Progression   Progression Continue to progress workloads to maintain intensity without  signs/symptoms of physical distress.       Average METs 4.28         Resistance Training   Training Prescription Yes       Weight 5 lb       Reps 10-15         Interval Training   Interval Training No         Treadmill   MPH 3.5       Grade 2.5  Minutes 15       METs 4.89         Recumbant Bike   Level 9       Watts 54       Minutes 15       METs 2.97         NuStep   Level 6       Minutes 15       METs 4.9         REL-XR   Level 7       Minutes 15       METs 5.2         Oxygen   Maintain Oxygen Saturation 88% or higher          Exercise Comments:   Exercise Comments     Row Name 11/12/23 1602           Exercise Comments First full day of exercise!  Patient was oriented to gym and equipment including functions, settings, policies, and procedures.  Patient's individual exercise prescription and treatment plan were reviewed.  All starting workloads were established based on the results of the 6 minute walk test done at initial orientation visit.  The plan for exercise progression was also introduced and progression will be customized based on patient's performance and goals.          Exercise Goals and Review:   Exercise Goals     Row Name 10/31/23 1315             Exercise Goals   Increase Physical Activity Yes       Intervention Provide advice, education, support and counseling about physical activity/exercise needs.;Develop an individualized exercise prescription for aerobic and resistive training based on initial evaluation findings, risk stratification, comorbidities and participant's personal goals.       Expected Outcomes Short Term: Attend rehab on a regular basis to increase amount of physical activity.;Long Term: Add in home exercise to make exercise part of routine and to increase amount of physical activity.;Long Term: Exercising regularly at least 3-5 days a week.       Increase Strength and Stamina Yes       Intervention Provide  advice, education, support and counseling about physical activity/exercise needs.;Develop an individualized exercise prescription for aerobic and resistive training based on initial evaluation findings, risk stratification, comorbidities and participant's personal goals.       Expected Outcomes Short Term: Increase workloads from initial exercise prescription for resistance, speed, and METs.;Short Term: Perform resistance training exercises routinely during rehab and add in resistance training at home;Long Term: Improve cardiorespiratory fitness, muscular endurance and strength as measured by increased METs and functional capacity ( )       Able to understand and use rate of perceived exertion (RPE) scale Yes       Intervention Provide education and explanation on how to use RPE scale       Expected Outcomes Short Term: Able to use RPE daily in rehab to express subjective intensity level;Long Term:  Able to use RPE to guide intensity level when exercising independently       Able to understand and use Dyspnea scale Yes       Intervention Provide education and explanation on how to use Dyspnea scale       Expected Outcomes Long Term: Able to use Dyspnea scale to guide intensity level when exercising independently;Short Term: Able to use Dyspnea scale daily in rehab to express subjective sense of  shortness of breath during exertion       Knowledge and understanding of Target Heart Rate Range (THRR) Yes       Intervention Provide education and explanation of THRR including how the numbers were predicted and where they are located for reference       Expected Outcomes Short Term: Able to use daily as guideline for intensity in rehab;Short Term: Able to state/look up THRR;Long Term: Able to use THRR to govern intensity when exercising independently       Able to check pulse independently Yes       Intervention Provide education and demonstration on how to check pulse in carotid and radial arteries.;Review  the importance of being able to check your own pulse for safety during independent exercise       Expected Outcomes Short Term: Able to explain why pulse checking is important during independent exercise;Long Term: Able to check pulse independently and accurately       Understanding of Exercise Prescription Yes       Intervention Provide education, explanation, and written materials on patient's individual exercise prescription       Expected Outcomes Long Term: Able to explain home exercise prescription to exercise independently;Short Term: Able to explain program exercise prescription          Exercise Goals Re-Evaluation :  Exercise Goals Re-Evaluation     Row Name 11/12/23 1603 11/21/23 1415 12/06/23 1222 12/19/23 1118 12/25/23 1115     Exercise Goal Re-Evaluation   Exercise Goals Review Increase Physical Activity;Knowledge and understanding of Target Heart Rate Range (THRR);Able to understand and use rate of perceived exertion (RPE) scale;Understanding of Exercise Prescription;Increase Strength and Stamina;Able to check pulse independently Increase Physical Activity;Understanding of Exercise Prescription;Increase Strength and Stamina Increase Physical Activity;Understanding of Exercise Prescription;Increase Strength and Stamina Increase Physical Activity;Understanding of Exercise Prescription;Increase Strength and Stamina Increase Physical Activity;Increase Strength and Stamina;Understanding of Exercise Prescription   Comments Reviewed RPE and dyspnea scale, THR and program prescription with pt today.  Pt voiced understanding and was given a copy of goals to take home. Nichol is off to a good start in the program and completed her first week in this review. She tolerated her exercise prescription well. She increased her workload on the treadmill already to a speed of 2.8 mph and incline of 2.5%. She also already increased to level 3 on the elliptical. She added the XR to her prescription and  worked at level 4. We will continue to monitor her progress in the program. Twylla is doing well in rehab. She was recently able to increase her speed on the treadmill from 2. to 3. while maintaining an incline of 2%. She was also able to increase from level 3 to 5 on the elliptical. We will continue to monitor her progress in the program. Samentha continues to do well in rehab. She was recently able to increase her incline on the treadmill from 2 to 4%. She was also able to increase from level 3 to 5 on the rowing machine. We will continue to monitor her progress in the program. Zurie is doing well here at rehab. She likes to swim on days she is not working out here. but with the weather cooling down she is looking to find other exercise. She has a friend who has invited her to walk.   Expected Outcomes Short: Use RPE daily to regulate intensity.  Long: Follow program prescription in THR. Short: Continue to follow current exercise prescription. Long: Continue  exercise to improve strength and stamina. Short: Continue to increase treadmill workload. Long: Continue exericse to improve strength and stamina. Short: Continue to increase workloads. Long: Continue exercise to improve strength and stamina. STG: look to walk with friend and increase workload here at rehab. LTG: Continue exercise to improve strength and stamina.    Row Name 01/02/24 1531 01/17/24 1351           Exercise Goal Re-Evaluation   Exercise Goals Review Increase Physical Activity;Increase Strength and Stamina;Understanding of Exercise Prescription Increase Physical Activity;Increase Strength and Stamina;Understanding of Exercise Prescription      Comments Stina continues to do well in rehab. She increased her workload on the treadmill to a speed of 3 mph with 6% incline. She increased to level 4 on the elliptical. She also increased to level 10 on the rower with an average of 68 watts. We will continue to monitor her progress in the  program. Analucia continues to do well in rehab. She has continued to walk on the treadmill and improved to level 7 on the XR. She also began using the T4 nustep at level 6 and the rcumbent bike at level 9. We will continue to monitor her progress in the program.      Expected Outcomes Short: Continue to progressively increase treadmill and elliptical workloads. Long: Continue exercise to improve strength and stamina. Short: Continue to progressively increase workloads. Long: Continue exercise to improve strength and stamina.         Discharge Exercise Prescription (Final Exercise Prescription Changes):  Exercise Prescription Changes - 01/17/24 1300       Response to Exercise   Blood Pressure (Admit) 118/58    Blood Pressure (Exit) 100/64    Heart Rate (Admit) 80 bpm    Heart Rate (Exercise) 113 bpm    Heart Rate (Exit) 82 bpm    Rating of Perceived Exertion (Exercise) 16    Symptoms none    Duration Continue with 30 min of aerobic exercise without signs/symptoms of physical distress.    Intensity THRR unchanged      Progression   Progression Continue to progress workloads to maintain intensity without signs/symptoms of physical distress.    Average METs 4.28      Resistance Training   Training Prescription Yes    Weight 5 lb    Reps 10-15      Interval Training   Interval Training No      Treadmill   MPH 3.5    Grade 2.5    Minutes 15    METs 4.89      Recumbant Bike   Level 9    Watts 54    Minutes 15    METs 2.97      NuStep   Level 6    Minutes 15    METs 4.9      REL-XR   Level 7    Minutes 15    METs 5.2      Oxygen   Maintain Oxygen Saturation 88% or higher          Nutrition:  Target Goals: Understanding of nutrition guidelines, daily intake of sodium 1500mg , cholesterol 200mg , calories 30% from fat and 7% or less from saturated fats, daily to have 5 or more servings of fruits and vegetables.  Education: Nutrition 1 -Group instruction provided  by verbal, written material, interactive activities, discussions, models, and posters to present general guidelines for heart healthy nutrition including macronutrients, label reading, and promoting whole foods  over processed counterparts. Education serves as Pensions consultant of discussion of heart healthy eating for all. Written material provided at class time. Flowsheet Row Cardiac Rehab from 12/20/2023 in [facility] Cardiac and Pulmonary Rehab  Date 12/12/23  Educator jg  Instruction Review Code 1- Verbalizes Understanding     Education: Nutrition 2 -Group instruction provided by verbal, written material, interactive activities, discussions, models, and posters to present general guidelines for heart healthy nutrition including sodium, cholesterol, and saturated fat. Providing guidance of habit forming to improve blood pressure, cholesterol, and body weight. Written material provided at class time. Flowsheet Row Cardiac Rehab from 12/20/2023 in [facility] Cardiac and Pulmonary Rehab  Date 12/20/23  Educator JG  Instruction Review Code 1- Verbalizes Understanding      Biometrics:  Pre Biometrics - 10/31/23 1316       Pre Biometrics   Height 5' 4.7 (1.643 m)    Weight 178 lb 8 oz (81 kg)    Waist Circumference 36 inches    Hip Circumference 40 inches    Waist to Hip Ratio 0.9 %    BMI (Calculated) 29.99    Single Leg Stand 30 seconds           Nutrition Therapy Plan and Nutrition Goals:   Nutrition Assessments:  MEDIFICTS Score Key: >=70 Need to make dietary changes  40-70 Heart Healthy Diet <= 40 Therapeutic Level Cholesterol Diet  Flowsheet Row Cardiac Rehab from 10/31/2023 in [facility] Cardiac and Pulmonary Rehab  Picture Your Plate Total Score on Admission 63   Picture Your Plate Scores: <59 Unhealthy dietary pattern with much room for improvement. 41-50 Dietary pattern unlikely to meet recommendations for good health and room for improvement. 51-60 More healthful dietary pattern, with  some room for improvement.  >60 Healthy dietary pattern, although there may be some specific behaviors that could be improved.    Nutrition Goals Re-Evaluation:  Nutrition Goals Re-Evaluation     Row Name 12/25/23 1122 01/23/24 9178           Goals   Comment Spoke with Edwina about nutrition. She has met with RD and has been logging foods on mynetdiary. She likes it and is able to track her nutrition goals like protein and fiber. She also reads labels and limits sodium and saturated fats. Iretta states she is doing better with her nutrition and it can be hard at times. She reads nutrition labels, has added more vegetables to her diet and is watching her portion sizes.      Expected Outcome STG: Continue to log and portion foods to meet goals and not exceed in guideline limits of sodium/saturated fat. LTG: FOllow a heart healthy lifestyle Short: Continue to read nutrition labels and watch portion sizes. Long: Follow a heart healthy lifestyle         Nutrition Goals Discharge (Final Nutrition Goals Re-Evaluation):  Nutrition Goals Re-Evaluation - 01/23/24 0821       Goals   Comment Obie states she is doing better with her nutrition and it can be hard at times. She reads nutrition labels, has added more vegetables to her diet and is watching her portion sizes.    Expected Outcome Short: Continue to read nutrition labels and watch portion sizes. Long: Follow a heart healthy lifestyle          Psychosocial: Target Goals: Acknowledge presence or absence of significant depression and/or stress, maximize coping skills, provide positive support system. Participant is able to verbalize types and ability to use techniques and  skills needed for reducing stress and depression.   Education: Stress, Anxiety, and Depression - Group verbal and visual presentation to define topics covered.  Reviews how body is impacted by stress, anxiety, and depression.  Also discusses healthy ways to reduce  stress and to treat/manage anxiety and depression. Written material provided at class time. Flowsheet Row Cardiac Rehab from 12/20/2023 in [facility] Cardiac and Pulmonary Rehab  Date 11/14/23  Educator sb  Instruction Review Code 1- [employer] Understanding    Education: Sleep Hygiene -Provides group verbal and written instruction about how sleep can affect your health.  Define sleep hygiene, discuss sleep cycles and impact of sleep habits. Review good sleep hygiene tips.   Initial Review & Psychosocial Screening:  Initial Psych Review & Screening - 10/25/23 1342       Initial Review   Current issues with None Identified      Family Dynamics   Good Support System? Yes   husband children     Barriers   Psychosocial barriers to participate in program There are no identifiable barriers or psychosocial needs.      Screening Interventions   Interventions To provide support and resources with identified psychosocial needs;Provide feedback about the scores to participant    Expected Outcomes Short Term goal: Utilizing psychosocial counselor, staff and physician to assist with identification of specific Stressors or current issues interfering with healing process. Setting desired goal for each stressor or current issue identified.;Long Term Goal: Stressors or current issues are controlled or eliminated.;Short Term goal: Identification and review with participant of any Quality of Life or Depression concerns found by scoring the questionnaire.;Long Term goal: The participant improves quality of Life and PHQ9 Scores as seen by post scores and/or verbalization of changes          Quality of Life Scores:   Quality of Life - 10/31/23 1316       Quality of Life   Select Quality of Life      Quality of Life Scores   Health/Function Pre 23.17 %    Socioeconomic Pre 28.13 %    Psych/Spiritual Pre 26.5 %    Family Pre 30 %    GLOBAL Pre 25.94 %         Scores of 19 and below usually indicate  a poorer quality of life in these areas.  A difference of  2-3 points is a clinically meaningful difference.  A difference of 2-3 points in the total score of the Quality of Life Index has been associated with significant improvement in overall quality of life, self-image, physical symptoms, and general health in studies assessing change in quality of life.  PHQ-9: Review Flowsheet  More data exists      11/21/2023 10/31/2023 10/05/2022 09/29/2021 09/21/2020  Depression screen PHQ 2/9  Decreased Interest 0 0 0 0 0  Down, Depressed, Hopeless 0 0 0 0 0  PHQ - 2 Score 0 0 0 0 0  Altered sleeping 0 0 0 - 0  Tired, decreased energy 0 2 3 - 0  Change in appetite 0 0 0 - 0  Feeling bad or failure about yourself  0 0 0 - 0  Trouble concentrating 0 0 0 - 0  Moving slowly or fidgety/restless 0 0 0 - 0  Suicidal thoughts 0 0 0 - 0  PHQ-9 Score 0 2 3 - 0  Difficult doing work/chores Not difficult at all Somewhat difficult Not difficult at all - Not difficult at all   Interpretation  of Total Score  Total Score Depression Severity:  1-4 = Minimal depression, 5-9 = Mild depression, 10-14 = Moderate depression, 15-19 = Moderately severe depression, 20-27 = Severe depression   Psychosocial Evaluation and Intervention:  Psychosocial Evaluation - 10/25/23 1513       Psychosocial Evaluation & Interventions   Interventions Encouraged to exercise with the program and follow exercise prescription    Comments There are no barriers to attending the program.  She has support from her husband and children.  She was walking 4 minles a week and swimming some prior to the stent.   She walks 10,000 steps a day during wrok three days a week.  She is ready to get started    Expected Outcomes STG attend all scheduled session, exerciseprogression and set habit of an exercise regimen.  LTG following her exercise regimen    Continue Psychosocial Services  Follow up required by staff          Psychosocial Re-Evaluation:   Psychosocial Re-Evaluation     Row Name 12/25/23 1117 01/23/24 0818           Psychosocial Re-Evaluation   Current issues with None Identified None Identified      Comments Kona denies any anxiety, depression, or stress at this time. Reports she has good support system which has helped her adjust to this new reality. She is sleeping well, 7-8hr most nights and wakes well rested. Shaquaya denies any stress, depression. or anxiety at this time. She is sleeping well and getting 7-9 hours most nights and has her work schedule set up so that she can get adequate rest. She has a good and strong support system. She has strategies to cope with any stress that may arise.      Expected Outcomes STG: Attend rehab and exercise for stress relief. LTG: Achieve and maintain a positive outlook on health and daily life Short: Attend rehab for exercise for added stress relief. Long: Maintain a positive outlook on health and daily life.      Interventions Encouraged to attend Cardiac Rehabilitation for the exercise Encouraged to attend Cardiac Rehabilitation for the exercise      Continue Psychosocial Services  Follow up required by staff Follow up required by staff         Psychosocial Discharge (Final Psychosocial Re-Evaluation):  Psychosocial Re-Evaluation - 01/23/24 0818       Psychosocial Re-Evaluation   Current issues with None Identified    Comments Lutie denies any stress, depression. or anxiety at this time. She is sleeping well and getting 7-9 hours most nights and has her work schedule set up so that she can get adequate rest. She has a good and strong support system. She has strategies to cope with any stress that may arise.    Expected Outcomes Short: Attend rehab for exercise for added stress relief. Long: Maintain a positive outlook on health and daily life.    Interventions Encouraged to attend Cardiac Rehabilitation for the exercise    Continue Psychosocial Services  Follow up required by  staff          Vocational Rehabilitation: Provide vocational rehab assistance to qualifying candidates.   Vocational Rehab Evaluation & Intervention:  Vocational Rehab - 10/25/23 1348       Initial Vocational Rehab Evaluation & Intervention   Assessment shows need for Vocational Rehabilitation No      Vocational Rehab Re-Evaulation   Comments back to work  Education: Education Goals: Education classes will be provided on a variety of topics geared toward better understanding of heart health and risk factor modification. Participant will state understanding/return demonstration of topics presented as noted by education test scores.  Learning Barriers/Preferences:  Learning Barriers/Preferences - 10/25/23 1348       Learning Barriers/Preferences   Learning Barriers None    Learning Preferences None          General Cardiac Education Topics:  AED/CPR: - Group verbal and written instruction with the use of models to demonstrate the basic use of the AED with the basic ABC's of resuscitation.   Test and Procedures: - Group verbal and visual presentation and models provide information about basic cardiac anatomy and function. Reviews the testing methods done to diagnose heart disease and the outcomes of the test results. Describes the treatment choices: Medical Management, Angioplasty, or Coronary Bypass Surgery for treating various heart conditions including Myocardial Infarction, Angina, Valve Disease, and Cardiac Arrhythmias. Written material provided at class time.   Medication Safety: - Group verbal and visual instruction to review commonly prescribed medications for heart and lung disease. Reviews the medication, class of the drug, and side effects. Includes the steps to properly store meds and maintain the prescription regimen. Written material provided at class time.   Intimacy: - Group verbal instruction through game format to discuss how heart and lung  disease can affect sexual intimacy. Written material provided at class time.   Know Your Numbers and Heart Failure: - Group verbal and visual instruction to discuss disease risk factors for cardiac and pulmonary disease and treatment options.  Reviews associated critical values for Overweight/Obesity, Hypertension, Cholesterol, and Diabetes.  Discusses basics of heart failure: signs/symptoms and treatments.  Introduces Heart Failure Zone chart for action plan for heart failure. Written material provided at class time.   Infection Prevention: - Provides verbal and written material to individual with discussion of infection control including proper hand washing and proper equipment cleaning during exercise session. Flowsheet Row Cardiac Rehab from 12/20/2023 in [facility] Cardiac and Pulmonary Rehab  Date 10/31/23  Educator [facility]  Instruction Review Code 1- Verbalizes Understanding    Falls Prevention: - Provides verbal and written material to individual with discussion of falls prevention and safety. Flowsheet Row Cardiac Rehab from 12/20/2023 in [facility] Cardiac and Pulmonary Rehab  Date 10/31/23  Educator [facility]  Instruction Review Code 1- Verbalizes Understanding    Other: -Provides group and verbal instruction on various topics (see comments)   Knowledge Questionnaire Score:  Knowledge Questionnaire Score - 10/31/23 1318       Knowledge Questionnaire Score   Pre Score 25/26          Core Components/Risk Factors/Patient Goals at Admission:  Personal Goals and Risk Factors at Admission - 10/25/23 1348       Core Components/Risk Factors/Patient Goals on Admission    Weight Management Yes;Weight Loss    Intervention Weight Management: Develop a combined nutrition and exercise program designed to reach desired caloric intake, while maintaining appropriate intake of nutrient and fiber, sodium and fats, and appropriate energy expenditure required for the weight goal.;Weight Management: Provide  education and appropriate resources to help participant work on and attain dietary goals.    Admit Weight 177 lb (80.3 kg)    Goal Weight: Short Term 176 lb (79.8 kg)    Goal Weight: Long Term 162 lb (73.5 kg)    Expected Outcomes Short Term: Continue to assess and modify interventions until short term  weight is achieved;Long Term: Adherence to nutrition and physical activity/exercise program aimed toward attainment of established weight goal;Weight Loss: Understanding of general recommendations for a balanced deficit meal plan, which promotes 1-2 lb weight loss per week and includes a negative energy balance of [phone] kcal/d    Lipids Yes    Intervention Provide education and support for participant on nutrition & aerobic/resistive exercise along with prescribed medications to achieve LDL 70mg , HDL >40mg .    Expected Outcomes Short Term: Participant states understanding of desired cholesterol values and is compliant with medications prescribed. Participant is following exercise prescription and nutrition guidelines.;Long Term: Cholesterol controlled with medications as prescribed, with individualized exercise RX and with personalized nutrition plan. Value goals: LDL < 70mg , HDL > 40 mg.          Education:Diabetes - Individual verbal and written instruction to review signs/symptoms of diabetes, desired ranges of glucose level fasting, after meals and with exercise. Acknowledge that pre and post exercise glucose checks will be done for 3 sessions at entry of program.   Core Components/Risk Factors/Patient Goals Review:   Goals and Risk Factor Review     Row Name 12/25/23 1124 01/23/24 9177           Core Components/Risk Factors/Patient Goals Review   Personal Goals Review Hypertension;Weight Management/Obesity Weight Management/Obesity;Lipids      Review Hila does not check her blood pressure on a regular basis because it stable. 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 She  takes her meds as prescribed. She is logging her foods and following guidelines given by RD to build muscle and keep weight controlled Dasja is trending downward with her weight and it is fluxuating by around 3 lbs after the weekend. She checks her weight daily at home. She had to stop her cholesterol medication due to it causing hip pain. She will be switching to a new medication after this one gets out of her system.      Expected Outcomes STG: Continue to log and meet protein goals. LTG: manage risk factors independently Short: Continue to monitor weight and take new medication once able. Long: Manage cardiovascular risk factors independently.         Core Components/Risk Factors/Patient Goals at Discharge (Final Review):   Goals and Risk Factor Review - 01/23/24 9177       Core Components/Risk Factors/Patient Goals Review   Personal Goals Review Weight Management/Obesity;Lipids    Review Lashika is trending downward with her weight and it is fluxuating by around 3 lbs after the weekend. She checks her weight daily at home. She had to stop her cholesterol medication due to it causing hip pain. She will be switching to a new medication after this one gets out of her system.    Expected Outcomes Short: Continue to monitor weight and take new medication once able. Long: Manage cardiovascular risk factors independently.          ITP Comments:  ITP Comments     Row Name 10/25/23 1427 10/31/23 1203 11/12/23 1602 11/28/23 0950 12/26/23 0954   ITP Comments Virtual orientation call completed today. shehas an appointment on Date: 10/31/2023  for EP eval and gym Orientation.  Documentation of diagnosis can be found in [facility] 10/10/2023 . Completed and gym orientation for cardiac rehab. Initial ITP created and sent for review to Dr. Oneil Pinal, Medical Director. First full day of exercise!  Patient was oriented to gym and equipment including functions, settings, policies,  and procedures.  Patient's  individual exercise prescription and treatment plan were reviewed.  All starting workloads were established based on the results of the 6 minute walk test done at initial orientation visit.  The plan for exercise progression was also introduced and progression will be customized based on patient's performance and goals. 30 Day review completed. Medical Director ITP review done, changes made as directed, and signed approval by Medical Director. New to program. 30 Day review completed. Medical Director ITP review done; changes made as directed and signed approval by Medical Director.    Row Name 01/23/24 1439           ITP Comments 30 Day review completed. Medical Director ITP review done; changes made as directed and signed approval by Medical Director.          Comments: 30 day review

## 2024-01-24 ENCOUNTER — Encounter

## 2024-01-28 ENCOUNTER — Encounter

## 2024-01-28 VITALS — Ht 64.7 in | Wt 176.1 lb

## 2024-01-28 DIAGNOSIS — Z955 Presence of coronary angioplasty implant and graft: Secondary | ICD-10-CM | POA: Diagnosis not present

## 2024-01-28 NOTE — Progress Notes (Signed)
 Daily Session Note  Patient Details  Name: Pamela Lara MRN: [id number] Date of Birth: Aug 15, 1960 Referring Provider:   Flowsheet Row Cardiac Rehab from 10/31/2023 in [facility] Cardiac and Pulmonary Rehab  Referring Provider Dr. Deatrice Cage    Encounter Date: 01/28/2024  Check In:  Session Check In - 01/28/24 0731       Check-In   Supervising physician immediately available to respond to emergencies See telemetry face sheet for immediately available ER MD    Location [facility]-Cardiac & Pulmonary Rehab    Staff Present Burnard [facility]en[facility] Peggi, RN, DNP, NE-BC;[facility] BS, ACSM CEP, Exercise Physiologist;Jason Elnor RDN,LDN    Virtual Visit No    Medication changes reported     No    Fall or balance concerns reported    No    Tobacco Cessation No Change    Warm-up and Cool-down Performed on first and last piece of equipment    Resistance Training Performed Yes    VAD Patient? No    PAD/SET Patient? No      Pain Assessment   Currently in Pain? No/denies             Social History   Tobacco Use  Smoking Status Former   Current packs/day: 0.00   Average packs/day: 0.3 packs/day for 38.0 years (9.5 ttl pk-yrs)   Types: Cigarettes   Start date: 06/29/1979   Quit date: 06/28/2017   Years since quitting: 63  Smokeless Tobacco Never  Tobacco Comments   since age 63/  07-27-2017 per pt quit smoking 02/ 28/ 2019 cold [place]    Goals Met:  Independence with exercise equipment Exercise tolerated well No report of concerns or symptoms today Strength training completed today  Goals Unmet:  Not Applicable  Comments: Pt able to follow exercise prescription today without complaint.  Will continue to monitor for progression.   6 Minute Walk     Row Name 10/31/23 1204 01/28/24 0816       6 Minute Walk   Phase Initial Discharge    Distance 1500 feet 1635 feet    Distance % Change -- 9 %    Distance Feet Change -- 135 ft    Walk  Time 6 minutes 6 minutes    # of Rest Breaks 0 0    MPH 2.84 3.1    METS 3.32 3.65    RPE 9 13    Perceived Dyspnea  0 0    VO2 Peak 11.6 12.78    Symptoms No No    Resting HR 66 bpm 88 bpm    Resting BP 112/64 100/60    Resting Oxygen Saturation  97 % 97 %    Exercise Oxygen Saturation  during 6 min walk 99 % 98 %    Max Ex. HR 91 bpm 95 bpm    Max Ex. BP 116/62 122/72    2 Minute Post BP 114/62 100/60        Reviewed home exercise with pt today.  Pt plans to walk and possibly join the [employer] [facility] for exercise.  Reviewed THR, pulse, RPE, sign and symptoms, pulse oximetery and when to call 911 or MD.  Also discussed weather considerations and indoor options.  Pt voiced understanding.     Dr. Oneil Pinal is Medical Director for [facility] Cardiac Rehabilitation.  Dr. Fuad Aleskerov is Medical Director for [facility] Pulmonary Rehabilitation.

## 2024-01-28 NOTE — Patient Instructions (Signed)
 Discharge Patient Instructions  Patient Details  Name: Pamela Lara MRN: [id number] Date of Birth: 07-27-1960 Referring Provider:  Marylynn Verneita LITTIE, MD   Number of Visits: 71  Reason for Discharge:  Patient reached a stable level of exercise. Patient independent in their exercise. Patient has met program and personal goals.  Smoking History:  Social History   Tobacco Use  Smoking Status Former   Current packs/day: 0.00   Average packs/day: 0.3 packs/day for 38.0 years (9.5 ttl pk-yrs)   Types: Cigarettes   Start date: 06/29/1979   Quit date: 06/28/2017   Years since quitting: 6.5  Smokeless Tobacco Never  Tobacco Comments   since age 79/  07-27-2017 per pt quit smoking 02/ 28/ 2019 cold [place]    Diagnosis:  Status post coronary artery stent placement  Initial Exercise Prescription:  Initial Exercise Prescription - 10/31/23 1200       Date of Initial Exercise RX and Referring Provider   Date 10/31/23    Referring Provider Dr. Deatrice Cage      Oxygen   Maintain Oxygen Saturation 88% or higher      Treadmill   MPH 2.8    Grade 1    Minutes 15    METs 3.53      Elliptical   Level 1    Speed 3    Minutes 15    METs 3.32      Rower   Level 3    Minutes 15    METs 3.32      Intensity   THRR 40-80% of Max Heartrate 102-139    Ratings of Perceived Exertion 11-13    Perceived Dyspnea 0-4      Progression   Progression Continue to progress workloads to maintain intensity without signs/symptoms of physical distress.      Resistance Training   Training Prescription Yes    Weight 5lb    Reps 10-15          Discharge Exercise Prescription (Final Exercise Prescription Changes):  Exercise Prescription Changes - 01/28/24 0800       Response to Exercise   Duration Continue with 30 min of aerobic exercise without signs/symptoms of physical distress.    Intensity THRR unchanged      Progression   Progression Continue to progress workloads to  maintain intensity without signs/symptoms of physical distress.    Average METs 4.28      Resistance Training   Training Prescription Yes    Weight 5 lb    Reps 10-15      Interval Training   Interval Training No      Treadmill   MPH 3.5    Grade 2.5    Minutes 15    METs 4.89      Recumbant Bike   Level 9    Watts 54    Minutes 15    METs 2.97      NuStep   Level 6    Minutes 15    METs 4.9      REL-XR   Level 7    Minutes 15    METs 5.2      Home Exercise Plan   Plans to continue exercise at [employer] (comment)   walking and [employer]   Frequency Add 2 additional days to program exercise sessions.    Initial Home Exercises Provided 01/28/24      Oxygen   Maintain Oxygen Saturation 88% or higher  Functional Capacity:  6 Minute Walk     Row Name 10/31/23 1204 01/28/24 0816       6 Minute Walk   Phase Initial Discharge    Distance 1500 feet 1635 feet    Distance % Change -- 9 %    Distance Feet Change -- 135 ft    Walk Time 6 minutes 6 minutes    # of Rest Breaks 0 0    MPH 2.84 3.1    METS 3.32 3.65    RPE 9 13    Perceived Dyspnea  0 0    VO2 Peak 11.6 12.78    Symptoms No No    Resting HR 66 bpm 88 bpm    Resting BP 112/64 100/60    Resting Oxygen Saturation  97 % 97 %    Exercise Oxygen Saturation  during 6 min walk 99 % 98 %    Max Ex. HR 91 bpm 95 bpm    Max Ex. BP 116/62 122/72    2 Minute Post BP 114/62 100/60       Nutrition & Weight - Outcomes:  Pre Biometrics - 10/31/23 1316       Pre Biometrics   Height 5' 4.7 (1.643 m)    Weight 178 lb 8 oz (81 kg)    Waist Circumference 36 inches    Hip Circumference 40 inches    Waist to Hip Ratio 0.9 %    BMI (Calculated) 29.99    Single Leg Stand 30 seconds          Post Biometrics - 01/28/24 0817        Post  Biometrics   Height 5' 4.7 (1.643 m)    Weight 176 lb 1.6 oz (79.9 kg)    Waist Circumference 36 inches    Hip Circumference 40 inches    Waist  to Hip Ratio 0.9 %    BMI (Calculated) 29.59    Single Leg Stand 30 seconds           Goals reviewed with patient; copy given to patient.

## 2024-01-29 ENCOUNTER — Other Ambulatory Visit: Payer: Self-pay

## 2024-01-29 MED ORDER — ROSUVASTATIN CALCIUM 20 MG PO TABS
20.0000 mg | ORAL_TABLET | Freq: Every day | ORAL | 3 refills | Status: DC
  Filled 2024-01-29: qty 90, 90d supply, fill #0

## 2024-01-30 ENCOUNTER — Ambulatory Visit: Admitting: Podiatry

## 2024-01-30 ENCOUNTER — Other Ambulatory Visit: Payer: Self-pay

## 2024-01-30 ENCOUNTER — Encounter: Attending: Cardiovascular Disease

## 2024-01-30 VITALS — Ht 64.7 in | Wt 176.1 lb

## 2024-01-30 DIAGNOSIS — Z955 Presence of coronary angioplasty implant and graft: Secondary | ICD-10-CM | POA: Insufficient documentation

## 2024-01-30 DIAGNOSIS — L600 Ingrowing nail: Secondary | ICD-10-CM | POA: Diagnosis not present

## 2024-01-30 MED ORDER — NEOMYCIN-POLYMYXIN-HC 3.5-10000-1 OT SUSP
OTIC | 0 refills | Status: DC
  Filled 2024-01-30: qty 10, 30d supply, fill #0

## 2024-01-30 NOTE — Progress Notes (Signed)
 Daily Session Note  Patient Details  Name: Pamela Lara MRN: [id number] Date of Birth: [birth year] Referring Provider:   Flowsheet Row Cardiac Rehab from 10/31/2023 in [facility] Cardiac and Pulmonary Rehab  Referring Provider Dr. Deatrice Cage    Encounter Date: 01/30/2024  Check In:  Session Check In - 01/30/24 1102       Check-In   Supervising physician immediately available to respond to emergencies See telemetry face sheet for immediately available ER MD    Location [facility]-Cardiac & Pulmonary Rehab    Staff Present Burnard Davenport [facility] Peggi, RN, DNP, NE-BC;[facility] Best, MS, Exercise Physiologist;Laura Cates RN,BSN    Virtual Visit No    Medication changes reported     No    Fall or balance concerns reported    No    Tobacco Cessation No Change    Warm-up and Cool-down Performed on first and last piece of equipment    Resistance Training Performed Yes    VAD Patient? No    PAD/SET Patient? No      Pain Assessment   Currently in Pain? No/denies             Social History   Tobacco Use  Smoking Status Former   Current packs/day: 0.00   Average packs/day: 0.3 packs/day for 38.0 years (9.5 ttl pk-yrs)   Types: Cigarettes   Start date: 06/29/1979   Quit date: 06/28/2017   Years since quitting: 6.5  Smokeless Tobacco Never  Tobacco Comments   since age 32/  07-27-2017 per pt quit smoking 02/ 28/ 2019 [place]    Goals Met:  Independence with exercise equipment Exercise tolerated well No report of concerns or symptoms today Strength training completed today  Goals Unmet:  Not Applicable  Comments: Pt able to follow exercise prescription today without complaint.  Will continue to monitor for progression.    Dr. Oneil Pinal is Medical Director for [facility] Cardiac Rehabilitation.  Dr. Fuad Aleskerov is Medical Director for [facility] Pulmonary Rehabilitation.

## 2024-01-30 NOTE — Patient Instructions (Signed)

## 2024-02-03 ENCOUNTER — Encounter: Payer: Self-pay | Admitting: Podiatry

## 2024-02-03 NOTE — Progress Notes (Signed)
  Subjective:  Patient ID: Pamela Lara, female    DOB: 01-25-1961,  MRN: [id number]  Chief Complaint  Patient presents with   Nail Problem    RM 3 Patient is here to evaluate right and left hallux for possible ingrown toe nail on lateral and medial side.    63 y.o. female presents with the above complaint. History confirmed with patient.  Chronic issue has not had infections with them but very painful and would like permanent relief  Objective:  Physical Exam: warm, good capillary refill, no trophic changes or ulcerative lesions, normal DP and PT pulses, normal sensory exam, and ingrown medial and lateral borders both great toenails no infection or paronychia  Assessment:   1. Ingrowing left great toenail   2. Ingrowing right great toenail      Plan:  Patient was evaluated and treated and all questions answered.    Ingrown Nail, bilaterally -Patient elects to proceed with minor surgery to remove ingrown toenail today. Consent reviewed and signed by patient. -Ingrown nail excised. See procedure note. -Educated on post-procedure care including soaking. Written instructions provided and reviewed. -Rx for Cortisporin sent to pharmacy. -Advised on signs and symptoms of infection developing.  We discussed that the phenol likely will create some redness and edema and tenderness around the nailbed as long as it is localized this is to be expected.  Will return as needed if any infection signs develop  Procedure: Excision of Ingrown Toenail Location: Bilateral 1st toe medial and lateral nail borders. Anesthesia: Lidocaine  1% plain; 1.5 mL and Marcaine  0.5% plain; 1.5 mL, digital block. Skin Prep: Betadine. Dressing: Silvadene; telfa; dry, sterile, compression dressing. Technique: Following skin prep, the toe was exsanguinated and a tourniquet was secured at the base of the toe. The affected nail border was freed, split with a nail splitter, and excised. Chemical matrixectomy was  then performed with phenol and irrigated out with alcohol. The tourniquet was then removed and sterile dressing applied. Disposition: Patient tolerated procedure well.    Return if symptoms worsen or fail to improve.

## 2024-02-04 ENCOUNTER — Other Ambulatory Visit: Payer: Self-pay

## 2024-02-04 ENCOUNTER — Encounter

## 2024-02-04 NOTE — Addendum Note (Signed)
 Addended by: ESTELLE OLAM GRADE on: 02/04/2024 03:18 PM   Modules accepted: Orders

## 2024-02-05 ENCOUNTER — Encounter

## 2024-02-05 DIAGNOSIS — Z955 Presence of coronary angioplasty implant and graft: Secondary | ICD-10-CM | POA: Diagnosis not present

## 2024-02-05 NOTE — Addendum Note (Signed)
 Addended by: BRIEN SALM on: 02/05/2024 04:50 PM   Modules accepted: Orders

## 2024-02-05 NOTE — Progress Notes (Signed)
 Daily Session Note  Patient Details  Name: Pamela Lara MRN: [id number] Date of Birth: 05-Apr-1961 Referring Provider:   Flowsheet Row Cardiac Rehab from 10/31/2023 in [facility] Cardiac and Pulmonary Rehab  Referring Provider Dr. Deatrice Cage    Encounter Date: 02/05/2024  Check In:  Session Check In - 02/05/24 1105       Check-In   Supervising physician immediately available to respond to emergencies See telemetry face sheet for immediately available ER MD    Location [facility]-Cardiac & Pulmonary Rehab    Staff Present Burnard Davenport RN,BSN,MPA;Meredith Tressa RN,BSN;Maxon Burnell BS, Exercise Physiologist;Margaret Best, MS, Exercise Physiologist;Jason Elnor RDN,LDN    Virtual Visit No    Medication changes reported     No    Fall or balance concerns reported    No    Tobacco Cessation No Change    Warm-up and Cool-down Performed on first and last piece of equipment    Resistance Training Performed Yes    VAD Patient? No    PAD/SET Patient? No      Pain Assessment   Currently in Pain? No/denies             Social History   Tobacco Use  Smoking Status Former   Current packs/day: 0.00   Average packs/day: 0.3 packs/day for 38.0 years (9.5 ttl pk-yrs)   Types: Cigarettes   Start date: 06/29/1979   Quit date: 06/28/2017   Years since quitting: 6.6  Smokeless Tobacco Never  Tobacco Comments   since age 68/  07-27-2017 per pt quit smoking 02/ 28/ 2019 [place]    Goals Met:  Independence with exercise equipment Exercise tolerated well No report of concerns or symptoms today Strength training completed today  Goals Unmet:  Not Applicable  Comments: Pt able to follow exercise prescription today without complaint.  Will continue to monitor for progression.    Dr. Oneil Pinal is Medical Director for [facility] Cardiac Rehabilitation.  Dr. Fuad Aleskerov is Medical Director for [facility] Pulmonary Rehabilitation.

## 2024-02-06 ENCOUNTER — Encounter

## 2024-02-07 DIAGNOSIS — E785 Hyperlipidemia, unspecified: Secondary | ICD-10-CM | POA: Diagnosis not present

## 2024-02-07 DIAGNOSIS — E782 Mixed hyperlipidemia: Secondary | ICD-10-CM | POA: Diagnosis not present

## 2024-02-08 ENCOUNTER — Ambulatory Visit: Payer: Self-pay | Admitting: Nurse Practitioner

## 2024-02-08 ENCOUNTER — Encounter: Admitting: *Deleted

## 2024-02-08 DIAGNOSIS — Z955 Presence of coronary angioplasty implant and graft: Secondary | ICD-10-CM

## 2024-02-08 LAB — LIPID PANEL
Chol/HDL Ratio: 3.3 ratio (ref 0.0–4.4)
Cholesterol, Total: 176 mg/dL (ref 100–199)
HDL: 54 mg/dL (ref >39)
LDL Chol Calc (NIH): 96 mg/dL (ref 0–99)
Triglycerides: 152 mg/dL — ABNORMAL HIGH (ref 0–149)
VLDL Cholesterol Cal: 26 mg/dL (ref 5–40)

## 2024-02-08 LAB — HEPATIC FUNCTION PANEL
ALT: 40 IU/L — ABNORMAL HIGH (ref 0–32)
AST: 29 IU/L (ref 0–40)
Albumin: 4.4 g/dL (ref 3.9–4.9)
Alkaline Phosphatase: 69 IU/L (ref 49–135)
Bilirubin Total: 0.7 mg/dL (ref 0.0–1.2)
Bilirubin, Direct: 0.19 mg/dL (ref 0.00–0.40)
Total Protein: 6.9 g/dL (ref 6.0–8.5)

## 2024-02-08 NOTE — Progress Notes (Signed)
 Daily Session Note  Patient Details  Name: Pamela Lara MRN: [id number] Date of Birth: Dec 18, 1960 Referring Provider:   Flowsheet Row Cardiac Rehab from 10/31/2023 in [facility] Cardiac and Pulmonary Rehab  Referring Provider Dr. Deatrice Cage    Encounter Date: 02/08/2024  Check In:  Session Check In - 02/08/24 0757       Check-In   Supervising physician immediately available to respond to emergencies See telemetry face sheet for immediately available ER MD    Location [facility]-Cardiac & Pulmonary Rehab    Staff Present Ronal Picket, RN, DNP, NE-BC;Noah Tickle, BS, Exercise Physiologist;Hobie Kohles RN,BSN;Maxon Conetta BS, Exercise Physiologist    Virtual Visit No    Medication changes reported     No    Fall or balance concerns reported    No    Warm-up and Cool-down Performed on first and last piece of equipment    Resistance Training Performed Yes    VAD Patient? No    PAD/SET Patient? No      Pain Assessment   Currently in Pain? No/denies             Social History   Tobacco Use  Smoking Status Former   Current packs/day: 0.00   Average packs/day: 0.3 packs/day for 38.0 years (9.5 ttl pk-yrs)   Types: Cigarettes   Start date: 06/29/1979   Quit date: 06/28/2017   Years since quitting: 6.6  Smokeless Tobacco Never  Tobacco Comments   since age 12/  07-27-2017 per pt quit smoking 02/ 28/ 2019 [place]    Goals Met:  Independence with exercise equipment Exercise tolerated well No report of concerns or symptoms today Strength training completed today  Goals Unmet:  Not Applicable  Comments: Pt able to follow exercise prescription today without complaint.  Will continue to monitor for progression.    Dr. Oneil Pinal is Medical Director for [facility] Cardiac Rehabilitation.  Dr. Fuad Aleskerov is Medical Director for [facility] Pulmonary Rehabilitation.

## 2024-02-08 NOTE — Addendum Note (Signed)
 Addended by: ESTELLE OLAM GRADE on: 02/08/2024 01:02 PM   Modules accepted: Orders

## 2024-02-11 ENCOUNTER — Encounter

## 2024-02-11 DIAGNOSIS — Z955 Presence of coronary angioplasty implant and graft: Secondary | ICD-10-CM

## 2024-02-11 NOTE — Progress Notes (Signed)
 Daily Session Note  Patient Details  Name: Pamela Lara MRN: [id number] Date of Birth: [birth year] Referring Provider:   Flowsheet Row Cardiac Rehab from 10/31/2023 in [facility] Cardiac and Pulmonary Rehab  Referring Provider Dr. Deatrice Cage    Encounter Date: 02/11/2024  Check In:  Session Check In - 02/11/24 0741       Check-In   Supervising physician immediately available to respond to emergencies See telemetry face sheet for immediately available ER MD    Location [facility]-Cardiac & Pulmonary Rehab    Staff Present Burnard Davenport RN,BSN,MPA;Joseph [facility] Dyane BS, ACSM CEP, Exercise Physiologist    Virtual Visit No    Medication changes reported     No    Fall or balance concerns reported    No    Tobacco Cessation No Change    Warm-up and Cool-down Performed on first and last piece of equipment    Resistance Training Performed Yes    VAD Patient? No    PAD/SET Patient? No      Pain Assessment   Currently in Pain? No/denies             Social History   Tobacco Use  Smoking Status Former   Current packs/day: 0.00   Average packs/day: 0.3 packs/day for 38.0 years (9.5 ttl pk-yrs)   Types: Cigarettes   Start date: 06/29/1979   Quit date: 06/28/2017   Years since quitting: 6.6  Smokeless Tobacco Never  Tobacco Comments   since age 65/  07-27-2017 per pt quit smoking 02/ 28/ 2019 cold [place]    Goals Met:  Independence with exercise equipment Exercise tolerated well No report of concerns or symptoms today Strength training completed today  Goals Unmet:  Not Applicable  Comments:  Pamela Lara graduated today from  rehab with 36 sessions completed.  Details of the patient's exercise prescription and what She needs to do in order to continue the prescription and progress were discussed with patient.  Patient was given a copy of prescription and goals.  Patient verbalized understanding. Pamela Lara plans to continue to exercise by going to a [employer].     Dr. Oneil Pinal is Medical Director for [facility] Cardiac Rehabilitation.  Dr. Fuad Aleskerov is Medical Director for [facility] Pulmonary Rehabilitation.

## 2024-02-11 NOTE — Progress Notes (Signed)
 Cardiac Individual Treatment Plan  Patient Details  Name: Pamela Lara MRN: [id number] Date of Birth: 1961-03-21 Referring Provider:   Flowsheet Row Cardiac Rehab from 10/31/2023 in [facility] Cardiac and Pulmonary Rehab  Referring Provider Dr. Deatrice Cage    Initial Encounter Date:  Flowsheet Row Cardiac Rehab from 10/31/2023 in [facility] Cardiac and Pulmonary Rehab  Date 10/31/23    Visit Diagnosis: Status post coronary artery stent placement  Patient's Home Medications on Admission:  Current Outpatient Medications:    acetaminophen  (TYLENOL ) 500 MG tablet, Take 1,000 mg by mouth every 6 (six) hours as needed for moderate pain (pain score 4-6) or mild pain (pain score 1-3)., Disp: , Rfl:    aspirin  EC 81 MG tablet, Take 81 mg by mouth daily. Swallow whole., Disp: , Rfl:    calcium  carbonate (TUMS - DOSED IN MG ELEMENTAL CALCIUM ) 500 MG chewable tablet, Chew 1 tablet by mouth as needed for indigestion or heartburn., Disp: , Rfl:    Cholecalciferol  (VITAMIN D3) 1000 UNITS CAPS, Take 1,000 Units by mouth daily., Disp: , Rfl:    influenza vac split quadrivalent PF (FLUARIX  QUADRIVALENT) 0.5 ML injection, Inject into the muscle., Disp: 0.5 mL, Rfl: 0   K[place] Ginseng 1000 MG TABS, Take 1,000 mg by mouth daily. , Disp: , Rfl:    Multiple Vitamin (MULTIVITAMIN) tablet, Take 1 tablet by mouth daily., Disp: , Rfl:    neomycin -polymyxin-hydrocortisone (CORTISPORIN) 3.5-10000-1 OTIC suspension, Apply 1-2 drops daily after soaking and cover with bandaid, Disp: 10 mL, Rfl: 0   nitroGLYCERIN  (NITROSTAT ) 0.4 MG SL tablet, Place 1 tablet (0.4 mg total) under the tongue every 5 (five) minutes as needed for chest pain. (Patient not taking: Reported on 01/30/2024), Disp: 25 tablet, Rfl: 3   Omega-3 Fatty Acids (FISH OIL) 1000 MG CAPS, Take 1,800 mg by mouth daily. 900 mg each, Disp: , Rfl:    omeprazole  (PRILOSEC) 20 MG capsule, Take 20 mg by mouth daily. (Patient taking differently: Take 20 mg by mouth 2  (two) times daily as needed.), Disp: , Rfl:    prasugrel  (EFFIENT ) 10 MG TABS tablet, Take 1 tablet (10 mg total) by mouth daily., Disp: 90 tablet, Rfl: 3   Psyllium (METAMUCIL FIBER PO), Take 15 mLs by mouth every evening., Disp: , Rfl:   Past Medical History: Past Medical History:  Diagnosis Date   Arthritis    right shoulder   BPPV (benign paroxysmal positional vertigo)    CAD (coronary artery disease)    a. 09/2023 Cor CTA: Sev prox/mid LAD dzs w/ FFRct 0.64; b. 09/2023 PCI: LM 99p/m (3.5x20 Synergy DES), 49m, D1 99 (jailed-->DCBA), RI nl, LCX nl, RCA nl, EF 55-65%.   Congenital bowed legs    s/p  surgical correction   Diverticulitis, colon    recurrent   Dysplastic nevus 09/09/2018   R mid post thigh   Dysplastic nevus 07/09/2019   R buttock,moderate atypia   Dysplastic nevus 07/09/2019   L buttock, moderate atypia   Dysplastic nevus 07/09/2019   R lat popliteal, moderate atypia   Ectopic pregnancy 01/29/2000   GERD (gastroesophageal reflux disease)    watch diet (as needed takes apple cider vinager)   History of colonic diverticulitis    hx multiple acute diverticulitis episodes treated medically   History of ectopic pregnancy    History of Helicobacter pylori infection 2013  approx.   Melanoma (HCC) 04/19/2018   R prox bicep (MMIS)   PONV (postoperative nausea and vomiting)    Seasonal  affective disorder     Tobacco Use: Social History   Tobacco Use  Smoking Status Former   Current packs/day: 0.00   Average packs/day: 0.3 packs/day for 38.0 years (9.5 ttl pk-yrs)   Types: Cigarettes   Start date: 06/29/1979   Quit date: 06/28/2017   Years since quitting: 6.6  Smokeless Tobacco Never  Tobacco Comments   since age 24/  07-27-2017 per pt quit smoking 02/ 28/ 2019 cold [place]    Labs: Review Flowsheet  More data exists      Latest Ref Rng & Units 09/21/2020 09/29/2021 10/05/2022 11/20/2023 02/07/2024  Labs for ITP Cardiac and Pulmonary Rehab  Cholestrol 100 - 199  mg/dL - 776  804  838  823   LDL (calc) 0 - 99 mg/dL - - 894  78  96   Direct LDL mg/dL - 860.9  863.9  - -  HDL-C >39 mg/dL - 43.99  45.19  48  54   Trlycerides 0 - 149 mg/dL - 751.9  821.9  824  847   Hemoglobin A1c 4.6 - 6.5 % 5.3  5.4  5.4  - -     Exercise Target Goals: Exercise Program Goal: Individual exercise prescription set using results from initial 6 min walk test and THRR while considering  patient's activity barriers and safety.   Exercise Prescription Goal: Initial exercise prescription builds to 30-45 minutes a day of aerobic activity, 2-3 days per week.  Home exercise guidelines will be given to patient during program as part of exercise prescription that the participant will acknowledge.   Education: Aerobic Exercise: - Group verbal and visual presentation on the components of exercise prescription. Introduces F.I.T.T principle from ACSM for exercise prescriptions.  Reviews F.I.T.T. principles of aerobic exercise including progression. Written material provided at class time.   Education: Resistance Exercise: - Group verbal and visual presentation on the components of exercise prescription. Introduces F.I.T.T principle from ACSM for exercise prescriptions  Reviews F.I.T.T. principles of resistance exercise including progression. Written material provided at class time. Flowsheet Row Cardiac Rehab from 12/20/2023 in [facility] Cardiac and Pulmonary Rehab  Date 11/28/23  Educator [facility]  Instruction Review Code 1- [employer] Understanding     Education: Exercise & Equipment Safety: - Individual verbal instruction and demonstration of equipment use and safety with use of the equipment. Flowsheet Row Cardiac Rehab from 12/20/2023 in [facility] Cardiac and Pulmonary Rehab  Date 10/31/23  Educator [facility]  Instruction Review Code 1- Verbalizes Understanding    Education: Exercise Physiology & General Exercise Guidelines: - Group verbal and written instruction with models to review the  exercise physiology of the cardiovascular system and associated critical values. Provides general exercise guidelines with specific guidelines to those with heart or lung disease. Written material provided at class time. Flowsheet Row Cardiac Rehab from 12/20/2023 in [facility] Cardiac and Pulmonary Rehab  Date 11/21/23  Educator nt  Instruction Review Code 1- TEFL teacher Understanding    Education: Flexibility, Balance, Mind/Body Relaxation: - Group verbal and visual presentation with interactive activity on the components of exercise prescription. Introduces F.I.T.T principle from ACSM for exercise prescriptions. Reviews F.I.T.T. principles of flexibility and balance exercise training including progression. Also discusses the mind body connection.  Reviews various relaxation techniques to help reduce and manage stress (i.e. Deep breathing, progressive muscle relaxation, and visualization). Balance handout provided to take home. Written material provided at class time. Flowsheet Row Cardiac Rehab from 12/20/2023 in [facility] Cardiac and Pulmonary Rehab  Date 11/28/23  Educator [facility]  Instruction Review Code 1- Verbalizes Understanding    Activity Barriers & Risk Stratification:  Activity Barriers & Cardiac Risk Stratification - 10/31/23 1205       Activity Barriers & Cardiac Risk Stratification   Activity Barriers None    Cardiac Risk Stratification Moderate          6 Minute Walk:  6 Minute Walk     Row Name 10/31/23 1204 01/28/24 0816       6 Minute Walk   Phase Initial Discharge    Distance 1500 feet 1635 feet    Distance % Change -- 9 %    Distance Feet Change -- 135 ft    Walk Time 6 minutes 6 minutes    # of Rest Breaks 0 0    MPH 2.84 3.1    METS 3.32 3.65    RPE 9 13    Perceived Dyspnea  0 0    VO2 Peak 11.6 12.78    Symptoms No No    Resting HR 66 bpm 88 bpm    Resting BP 112/64 100/60    Resting Oxygen Saturation  97 % 97 %    Exercise Oxygen Saturation  during 6 min walk  99 % 98 %    Max Ex. HR 91 bpm 95 bpm    Max Ex. BP 116/62 122/72    2 Minute Post BP 114/62 100/60       Oxygen Initial Assessment:   Oxygen Re-Evaluation:   Oxygen Discharge (Final Oxygen Re-Evaluation):   Initial Exercise Prescription:  Initial Exercise Prescription - 10/31/23 1200       Date of Initial Exercise RX and Referring Provider   Date 10/31/23    Referring Provider Dr. Deatrice Cage      Oxygen   Maintain Oxygen Saturation 88% or higher      Treadmill   MPH 2.8    Grade 1    Minutes 15    METs 3.53      Elliptical   Level 1    Speed 3    Minutes 15    METs 3.32      Rower   Level 3    Minutes 15    METs 3.32      Intensity   THRR 40-80% of Max Heartrate 102-139    Ratings of Perceived Exertion 11-13    Perceived Dyspnea 0-4      Progression   Progression Continue to progress workloads to maintain intensity without signs/symptoms of physical distress.      Resistance Training   Training Prescription Yes    Weight 5lb    Reps 10-15          Perform Capillary Blood Glucose checks as needed.  Exercise Prescription Changes:   Exercise Prescription Changes     Row Name 10/31/23 1200 11/21/23 1400 12/06/23 1200 12/19/23 1100 01/02/24 1500     Response to Exercise   Blood Pressure (Admit) 112/64 102/60 100/60 98/58 124/62   Blood Pressure (Exercise) 116/62 124/62 146/64 154/70 --   Blood Pressure (Exit) 114/62 112/64 108/62 100/56 98/58   Heart Rate (Admit) 66 bpm 88 bpm 88 bpm 67 bpm 60 bpm   Heart Rate (Exercise) 91 bpm 120 bpm 120 bpm 125 bpm 127 bpm   Heart Rate (Exit) 74 bpm 83 bpm 83 bpm 89 bpm 92 bpm   Oxygen Saturation (Admit) 97 % -- -- -- --   Oxygen Saturation (Exercise) 99 % -- -- -- --  Oxygen Saturation (Exit) 99 % -- -- -- --   Rating of Perceived Exertion (Exercise) 9 15 17 15 15    Perceived Dyspnea (Exercise) 0 -- -- -- --   Symptoms results none none none none   Comments -- 1st week of exercise sessions --  -- --   Duration -- Progress to 30 minutes of  aerobic without signs/symptoms of physical distress Progress to 30 minutes of  aerobic without signs/symptoms of physical distress Progress to 30 minutes of  aerobic without signs/symptoms of physical distress Progress to 30 minutes of  aerobic without signs/symptoms of physical distress   Intensity -- THRR unchanged THRR unchanged THRR unchanged THRR unchanged     Progression   Progression -- Continue to progress workloads to maintain intensity without signs/symptoms of physical distress. Continue to progress workloads to maintain intensity without signs/symptoms of physical distress. Continue to progress workloads to maintain intensity without signs/symptoms of physical distress. Continue to progress workloads to maintain intensity without signs/symptoms of physical distress.   Average METs -- 3.89 3.8 4.2 4.52     Resistance Training   Training Prescription -- Yes Yes Yes Yes   Weight -- 5lb 5lb 5lb 5lb   Reps -- 10-15 10-15 10-15 10-15     Interval Training   Interval Training -- No No No No     Treadmill   MPH -- 2.8 3.8 3.4 3   Grade -- 2.5 2 4 6    Minutes -- 15 15 15 15    METs -- 4.11 5.4 5.48 5.78     Arm Ergometer   Level -- -- -- 4 --   Minutes -- -- -- 15 --   METs -- -- -- 1 --     Elliptical   Level -- 3 5 1 4    Speed -- 5 6 5 2    Minutes -- 15 15 15 15    METs -- 5.1 -- 3.4 3.5     REL-XR   Level -- 4 -- -- --   Minutes -- 15 -- -- --   METs -- 3.2 -- -- --     Rower   Level -- -- -- 5 10   Minutes -- -- -- 15 15   METs -- -- -- 4.21 6.18     Oxygen   Maintain Oxygen Saturation -- 88% or higher 88% or higher 88% or higher 88% or higher    Row Name 01/17/24 1300 01/28/24 0800 01/31/24 1700         Response to Exercise   Blood Pressure (Admit) 118/58 -- 110/62     Blood Pressure (Exit) 100/64 -- 102/56     Heart Rate (Admit) 80 bpm -- 84 bpm     Heart Rate (Exercise) 113 bpm -- 122 bpm     Heart Rate  (Exit) 82 bpm -- 80 bpm     Rating of Perceived Exertion (Exercise) 16 -- 15     Symptoms none -- none     Duration Continue with 30 min of aerobic exercise without signs/symptoms of physical distress. Continue with 30 min of aerobic exercise without signs/symptoms of physical distress. Continue with 30 min of aerobic exercise without signs/symptoms of physical distress.     Intensity THRR unchanged THRR unchanged THRR unchanged       Progression   Progression Continue to progress workloads to maintain intensity without signs/symptoms of physical distress. Continue to progress workloads to maintain intensity without signs/symptoms of physical distress. Continue to progress  workloads to maintain intensity without signs/symptoms of physical distress.     Average METs 4.28 4.28 5.54       Resistance Training   Training Prescription Yes Yes Yes     Weight 5 lb 5 lb 5 lb     Reps 10-15 10-15 10-15       Interval Training   Interval Training No No No       Treadmill   MPH 3.5 3.5 4     Grade 2.5 2.5 2     Minutes 15 15 15      METs 4.89 4.89 5.17       Recumbant Bike   Level 9 9 --     Watts 54 54 --     Minutes 15 15 --     METs 2.97 2.97 --       NuStep   Level 6 6 7      Minutes 15 15 15      METs 4.9 4.9 6.5       Elliptical   Level -- -- 5     Speed -- -- 3     Minutes -- -- 15       REL-XR   Level 7 7 9      Minutes 15 15 15      METs 5.2 5.2 6.8       Home Exercise Plan   Plans to continue exercise at -- [employer] (comment)  walking and [employer] (comment)  walking and [employer]     Frequency -- Add 2 additional days to program exercise sessions. Add 2 additional days to program exercise sessions.     Initial Home Exercises Provided -- 01/28/24 01/28/24       Oxygen   Maintain Oxygen Saturation 88% or higher 88% or higher 88% or higher        Exercise Comments:   Exercise Comments     Row Name 11/12/23 1602 02/11/24 0743          Exercise Comments First full day of exercise!  Patient was oriented to gym and equipment including functions, settings, policies, and procedures.  Patient's individual exercise prescription and treatment plan were reviewed.  All starting workloads were established based on the results of the 6 minute walk test done at initial orientation visit.  The plan for exercise progression was also introduced and progression will be customized based on patient's performance and goals. Pamela Lara graduated today from  rehab with 36 sessions completed.  Details of the patient's exercise prescription and what She needs to do in order to continue the prescription and progress were discussed with patient.  Patient was given a copy of prescription and goals.  Patient verbalized understanding. Edit plans to continue to exercise by going to a [employer].         Exercise Goals and Review:   Exercise Goals     Row Name 10/31/23 1315             Exercise Goals   Increase Physical Activity Yes       Intervention Provide advice, education, support and counseling about physical activity/exercise needs.;Develop an individualized exercise prescription for aerobic and resistive training based on initial evaluation findings, risk stratification, comorbidities and participant's personal goals.       Expected Outcomes Short Term: Attend rehab on a regular basis to increase amount of physical activity.;Long Term: Add in home exercise to make exercise part of routine and to increase amount of physical  activity.;Long Term: Exercising regularly at least 3-5 days a week.       Increase Strength and Stamina Yes       Intervention Provide advice, education, support and counseling about physical activity/exercise needs.;Develop an individualized exercise prescription for aerobic and resistive training based on initial evaluation findings, risk stratification, comorbidities and participant's personal goals.       Expected Outcomes  Short Term: Increase workloads from initial exercise prescription for resistance, speed, and METs.;Short Term: Perform resistance training exercises routinely during rehab and add in resistance training at home;Long Term: Improve cardiorespiratory fitness, muscular endurance and strength as measured by increased METs and functional capacity ( )       Able to understand and use rate of perceived exertion (RPE) scale Yes       Intervention Provide education and explanation on how to use RPE scale       Expected Outcomes Short Term: Able to use RPE daily in rehab to express subjective intensity level;Long Term:  Able to use RPE to guide intensity level when exercising independently       Able to understand and use Dyspnea scale Yes       Intervention Provide education and explanation on how to use Dyspnea scale       Expected Outcomes Long Term: Able to use Dyspnea scale to guide intensity level when exercising independently;Short Term: Able to use Dyspnea scale daily in rehab to express subjective sense of shortness of breath during exertion       Knowledge and understanding of Target Heart Rate Range (THRR) Yes       Intervention Provide education and explanation of THRR including how the numbers were predicted and where they are located for reference       Expected Outcomes Short Term: Able to use daily as guideline for intensity in rehab;Short Term: Able to state/look up THRR;Long Term: Able to use THRR to govern intensity when exercising independently       Able to check pulse independently Yes       Intervention Provide education and demonstration on how to check pulse in carotid and radial arteries.;Review the importance of being able to check your own pulse for safety during independent exercise       Expected Outcomes Short Term: Able to explain why pulse checking is important during independent exercise;Long Term: Able to check pulse independently and accurately       Understanding of Exercise  Prescription Yes       Intervention Provide education, explanation, and written materials on patient's individual exercise prescription       Expected Outcomes Long Term: Able to explain home exercise prescription to exercise independently;Short Term: Able to explain program exercise prescription          Exercise Goals Re-Evaluation :  Exercise Goals Re-Evaluation     Row Name 11/12/23 1603 11/21/23 1415 12/06/23 1222 12/19/23 1118 12/25/23 1115     Exercise Goal Re-Evaluation   Exercise Goals Review Increase Physical Activity;Knowledge and understanding of Target Heart Rate Range (THRR);Able to understand and use rate of perceived exertion (RPE) scale;Understanding of Exercise Prescription;Increase Strength and Stamina;Able to check pulse independently Increase Physical Activity;Understanding of Exercise Prescription;Increase Strength and Stamina Increase Physical Activity;Understanding of Exercise Prescription;Increase Strength and Stamina Increase Physical Activity;Understanding of Exercise Prescription;Increase Strength and Stamina Increase Physical Activity;Increase Strength and Stamina;Understanding of Exercise Prescription   Comments Reviewed RPE and dyspnea scale, THR and program prescription with pt today.  Pt voiced understanding and was  given a copy of goals to take home. Pamela Lara is off to a good start in the program and completed her first week in this review. She tolerated her exercise prescription well. She increased her workload on the treadmill already to a speed of 2.8 mph and incline of 2.5%. She also already increased to level 3 on the elliptical. She added the XR to her prescription and worked at level 4. We will continue to monitor her progress in the program. Pamela Lara is doing well in rehab. She was recently able to increase her speed on the treadmill from 2. to 3. while maintaining an incline of 2%. She was also able to increase from level 3 to 5 on the elliptical. We will  continue to monitor her progress in the program. Pamela Lara continues to do well in rehab. She was recently able to increase her incline on the treadmill from 2 to 4%. She was also able to increase from level 3 to 5 on the rowing machine. We will continue to monitor her progress in the program. Pamela Lara is doing well here at rehab. She likes to swim on days she is not working out here. but with the weather cooling down she is looking to find other exercise. She has a friend who has invited her to walk.   Expected Outcomes Short: Use RPE daily to regulate intensity.  Long: Follow program prescription in THR. Short: Continue to follow current exercise prescription. Long: Continue exercise to improve strength and stamina. Short: Continue to increase treadmill workload. Long: Continue exericse to improve strength and stamina. Short: Continue to increase workloads. Long: Continue exercise to improve strength and stamina. STG: look to walk with friend and increase workload here at rehab. LTG: Continue exercise to improve strength and stamina.    Row Name 01/02/24 1531 01/17/24 1351 01/28/24 0818 01/31/24 1709       Exercise Goal Re-Evaluation   Exercise Goals Review Increase Physical Activity;Increase Strength and Stamina;Understanding of Exercise Prescription Increase Physical Activity;Increase Strength and Stamina;Understanding of Exercise Prescription Increase Physical Activity;Able to understand and use rate of perceived exertion (RPE) scale;Knowledge and understanding of Target Heart Rate Range (THRR);Understanding of Exercise Prescription;Increase Strength and Stamina;Able to understand and use Dyspnea scale;Able to check pulse independently Increase Physical Activity;Understanding of Exercise Prescription;Increase Strength and Stamina    Comments Pamela Lara continues to do well in rehab. She increased her workload on the treadmill to a speed of 3 mph with 6% incline. She increased to level 4 on the elliptical. She  also increased to level 10 on the rower with an average of 68 watts. We will continue to monitor her progress in the program. Pamela Lara continues to do well in rehab. She has continued to walk on the treadmill and improved to level 7 on the XR. She also began using the T4 nustep at level 6 and the rcumbent bike at level 9. We will continue to monitor her progress in the program. Reviewed home exercise with pt today.  Pt plans to walk and possibly join the [employer] [facility] for exercise upon graduation.  Reviewed THR, pulse, RPE, sign and symptoms, pulse oximetery and when to call 911 or MD.  Also discussed weather considerations and indoor options.  Pt voiced understanding. Pamela Lara also did her post 6 MWT today and improved by 9% Pamela Lara continues to do well in rehab. She is due for her post and will graduate very soon. She increased all her workloads. She increased to a speed of 4 mph  on the treadmill with 2% incline. She increased to level 5 on the elliptical, level 7 on the T4 nustep, and level 9 on the XR. We will continue to monitor her progress in the program.    Expected Outcomes Short: Continue to progressively increase treadmill and elliptical workloads. Long: Continue exercise to improve strength and stamina. Short: Continue to progressively increase workloads. Long: Continue exercise to improve strength and stamina. Short: graduate Long: maintain independent exercise routine upon graduation from cardiac rehab. Short: Graduate. Long: Continue to exercise independently.       Discharge Exercise Prescription (Final Exercise Prescription Changes):  Exercise Prescription Changes - 01/31/24 1700       Response to Exercise   Blood Pressure (Admit) 110/62    Blood Pressure (Exit) 102/56    Heart Rate (Admit) 84 bpm    Heart Rate (Exercise) 122 bpm    Heart Rate (Exit) 80 bpm    Rating of Perceived Exertion (Exercise) 15    Symptoms none    Duration Continue with 30 min of aerobic exercise  without signs/symptoms of physical distress.    Intensity THRR unchanged      Progression   Progression Continue to progress workloads to maintain intensity without signs/symptoms of physical distress.    Average METs 5.54      Resistance Training   Training Prescription Yes    Weight 5 lb    Reps 10-15      Interval Training   Interval Training No      Treadmill   MPH 4    Grade 2    Minutes 15    METs 5.17      NuStep   Level 7    Minutes 15    METs 6.5      Elliptical   Level 5    Speed 3    Minutes 15      REL-XR   Level 9    Minutes 15    METs 6.8      Home Exercise Plan   Plans to continue exercise at [facility] [facility] [employer] (comment)   walking and wellzone   Frequency Add 2 additional days to program exercise sessions.    Initial Home Exercises Provided 01/28/24      Oxygen   Maintain Oxygen Saturation 88% or higher          Nutrition:  Target Goals: Understanding of nutrition guidelines, daily intake of sodium 1500mg , cholesterol 200mg , calories 30% from fat and 7% or less from saturated fats, daily to have 5 or more servings of fruits and vegetables.  Education: Nutrition 1 -Group instruction provided by verbal, written material, interactive activities, discussions, models, and posters to present general guidelines for heart healthy nutrition including macronutrients, label reading, and promoting whole foods over processed counterparts. Education serves as Pensions consultant of discussion of heart healthy eating for all. Written material provided at class time. Flowsheet Row Cardiac Rehab from 12/20/2023 in [facility] Cardiac and Pulmonary Rehab  Date 12/12/23  Educator jg  Instruction Review Code 1- Verbalizes Understanding     Education: Nutrition 2 -Group instruction provided by verbal, written material, interactive activities, discussions, models, and posters to present general guidelines for heart healthy nutrition including sodium, cholesterol, and saturated  fat. Providing guidance of habit forming to improve blood pressure, cholesterol, and body weight. Written material provided at class time. Flowsheet Row Cardiac Rehab from 12/20/2023 in [facility] Cardiac and Pulmonary Rehab  Date 12/20/23  Educator JG  Instruction Review Code 1- [employer]  Understanding      Biometrics:  Pre Biometrics - 10/31/23 1316       Pre Biometrics   Height 5' 4.7 (1.643 m)    Weight 178 lb 8 oz (81 kg)    Waist Circumference 36 inches    Hip Circumference 40 inches    Waist to Hip Ratio 0.9 %    BMI (Calculated) 29.99    Single Leg Stand 30 seconds          Post Biometrics - 01/28/24 0817        Post  Biometrics   Height 5' 4.7 (1.643 m)    Weight 176 lb 1.6 oz (79.9 kg)    Waist Circumference 36 inches    Hip Circumference 40 inches    Waist to Hip Ratio 0.9 %    BMI (Calculated) 29.59    Single Leg Stand 30 seconds          Nutrition Therapy Plan and Nutrition Goals:   Nutrition Assessments:  MEDIFICTS Score Key: >=70 Need to make dietary changes  40-70 Heart Healthy Diet <= 40 Therapeutic Level Cholesterol Diet  Flowsheet Row Cardiac Rehab from 10/31/2023 in [facility] Cardiac and Pulmonary Rehab  Picture Your Plate Total Score on Admission 63   Picture Your Plate Scores: <59 Unhealthy dietary pattern with much room for improvement. 41-50 Dietary pattern unlikely to meet recommendations for good health and room for improvement. 51-60 More healthful dietary pattern, with some room for improvement.  >60 Healthy dietary pattern, although there may be some specific behaviors that could be improved.    Nutrition Goals Re-Evaluation:  Nutrition Goals Re-Evaluation     Row Name 12/25/23 1122 01/23/24 9178           Goals   Comment Spoke with Pamela Lara about nutrition. She has met with RD and has been logging foods on mynetdiary. She likes it and is able to track her nutrition goals like protein and fiber. She also reads labels and limits  sodium and saturated fats. Pamela Lara states she is doing better with her nutrition and it can be hard at times. She reads nutrition labels, has added more vegetables to her diet and is watching her portion sizes.      Expected Outcome STG: Continue to log and portion foods to meet goals and not exceed in guideline limits of sodium/saturated fat. LTG: FOllow a heart healthy lifestyle Short: Continue to read nutrition labels and watch portion sizes. Long: Follow a heart healthy lifestyle         Nutrition Goals Discharge (Final Nutrition Goals Re-Evaluation):  Nutrition Goals Re-Evaluation - 01/23/24 0821       Goals   Comment Pamela Lara states she is doing better with her nutrition and it can be hard at times. She reads nutrition labels, has added more vegetables to her diet and is watching her portion sizes.    Expected Outcome Short: Continue to read nutrition labels and watch portion sizes. Long: Follow a heart healthy lifestyle          Psychosocial: Target Goals: Acknowledge presence or absence of significant depression and/or stress, maximize coping skills, provide positive support system. Participant is able to verbalize types and ability to use techniques and skills needed for reducing stress and depression.   Education: Stress, Anxiety, and Depression - Group verbal and visual presentation to define topics covered.  Reviews how body is impacted by stress, anxiety, and depression.  Also discusses healthy ways to reduce stress and to treat/manage  anxiety and depression. Written material provided at class time. Flowsheet Row Cardiac Rehab from 12/20/2023 in [facility] Cardiac and Pulmonary Rehab  Date 11/14/23  Educator sb  Instruction Review Code 1- [employer] Understanding    Education: Sleep Hygiene -Provides group verbal and written instruction about how sleep can affect your health.  Define sleep hygiene, discuss sleep cycles and impact of sleep habits. Review good sleep hygiene  tips.   Initial Review & Psychosocial Screening:  Initial Psych Review & Screening - 10/25/23 1342       Initial Review   Current issues with None Identified      Family Dynamics   Good Support System? Yes   husband children     Barriers   Psychosocial barriers to participate in program There are no identifiable barriers or psychosocial needs.      Screening Interventions   Interventions To provide support and resources with identified psychosocial needs;Provide feedback about the scores to participant    Expected Outcomes Short Term goal: Utilizing psychosocial counselor, staff and physician to assist with identification of specific Stressors or current issues interfering with healing process. Setting desired goal for each stressor or current issue identified.;Long Term Goal: Stressors or current issues are controlled or eliminated.;Short Term goal: Identification and review with participant of any Quality of Life or Depression concerns found by scoring the questionnaire.;Long Term goal: The participant improves quality of Life and PHQ9 Scores as seen by post scores and/or verbalization of changes          Quality of Life Scores:   Quality of Life - 10/31/23 1316       Quality of Life   Select Quality of Life      Quality of Life Scores   Health/Function Pre 23.17 %    Socioeconomic Pre 28.13 %    Psych/Spiritual Pre 26.5 %    Family Pre 30 %    GLOBAL Pre 25.94 %         Scores of 19 and below usually indicate a poorer quality of life in these areas.  A difference of  2-3 points is a clinically meaningful difference.  A difference of 2-3 points in the total score of the Quality of Life Index has been associated with significant improvement in overall quality of life, self-image, physical symptoms, and general health in studies assessing change in quality of life.  PHQ-9: Review Flowsheet  More data exists      11/21/2023 10/31/2023 10/05/2022 09/29/2021 09/21/2020   Depression screen PHQ 2/9  Decreased Interest 0 0 0 0 0  Down, Depressed, Hopeless 0 0 0 0 0  PHQ - 2 Score 0 0 0 0 0  Altered sleeping 0 0 0 - 0  Tired, decreased energy 0 2 3 - 0  Change in appetite 0 0 0 - 0  Feeling bad or failure about yourself  0 0 0 - 0  Trouble concentrating 0 0 0 - 0  Moving slowly or fidgety/restless 0 0 0 - 0  Suicidal thoughts 0 0 0 - 0  PHQ-9 Score 0 2 3 - 0  Difficult doing work/chores Not difficult at all Somewhat difficult Not difficult at all - Not difficult at all   Interpretation of Total Score  Total Score Depression Severity:  1-4 = Minimal depression, 5-9 = Mild depression, 10-14 = Moderate depression, 15-19 = Moderately severe depression, 20-27 = Severe depression   Psychosocial Evaluation and Intervention:  Psychosocial Evaluation - 10/25/23 1513  Psychosocial Evaluation & Interventions   Interventions Encouraged to exercise with the program and follow exercise prescription    Comments There are no barriers to attending the program.  She has support from her husband and children.  She was walking 4 minles a week and swimming some prior to the stent.   She walks 10,000 steps a day during wrok three days a week.  She is ready to get started    Expected Outcomes STG attend all scheduled session, exerciseprogression and set habit of an exercise regimen.  LTG following her exercise regimen    Continue Psychosocial Services  Follow up required by staff          Psychosocial Re-Evaluation:  Psychosocial Re-Evaluation     Row Name 12/25/23 1117 01/23/24 0818           Psychosocial Re-Evaluation   Current issues with None Identified None Identified      Comments Pamela Lara denies any anxiety, depression, or stress at this time. Reports she has good support system which has helped her adjust to this new reality. She is sleeping well, 7-8hr most nights and wakes well rested. Pamela Lara denies any stress, depression. or anxiety at this time. She  is sleeping well and getting 7-9 hours most nights and has her work schedule set up so that she can get adequate rest. She has a good and strong support system. She has strategies to cope with any stress that may arise.      Expected Outcomes STG: Attend rehab and exercise for stress relief. LTG: Achieve and maintain a positive outlook on health and daily life Short: Attend rehab for exercise for added stress relief. Long: Maintain a positive outlook on health and daily life.      Interventions Encouraged to attend Cardiac Rehabilitation for the exercise Encouraged to attend Cardiac Rehabilitation for the exercise      Continue Psychosocial Services  Follow up required by staff Follow up required by staff         Psychosocial Discharge (Final Psychosocial Re-Evaluation):  Psychosocial Re-Evaluation - 01/23/24 0818       Psychosocial Re-Evaluation   Current issues with None Identified    Comments Pamela Lara denies any stress, depression. or anxiety at this time. She is sleeping well and getting 7-9 hours most nights and has her work schedule set up so that she can get adequate rest. She has a good and strong support system. She has strategies to cope with any stress that may arise.    Expected Outcomes Short: Attend rehab for exercise for added stress relief. Long: Maintain a positive outlook on health and daily life.    Interventions Encouraged to attend Cardiac Rehabilitation for the exercise    Continue Psychosocial Services  Follow up required by staff          Vocational Rehabilitation: Provide vocational rehab assistance to qualifying candidates.   Vocational Rehab Evaluation & Intervention:  Vocational Rehab - 10/25/23 1348       Initial Vocational Rehab Evaluation & Intervention   Assessment shows need for Vocational Rehabilitation No      Vocational Rehab Re-Evaulation   Comments back to work          Education: Education Goals: Education classes will be provided on a  variety of topics geared toward better understanding of heart health and risk factor modification. Participant will state understanding/return demonstration of topics presented as noted by education test scores.  Learning Barriers/Preferences:  Learning Barriers/Preferences - 10/25/23 1348  Learning Barriers/Preferences   Learning Barriers None    Learning Preferences None          General Cardiac Education Topics:  AED/CPR: - Group verbal and written instruction with the use of models to demonstrate the basic use of the AED with the basic ABC's of resuscitation.   Test and Procedures: - Group verbal and visual presentation and models provide information about basic cardiac anatomy and function. Reviews the testing methods done to diagnose heart disease and the outcomes of the test results. Describes the treatment choices: Medical Management, Angioplasty, or Coronary Bypass Surgery for treating various heart conditions including Myocardial Infarction, Angina, Valve Disease, and Cardiac Arrhythmias. Written material provided at class time.   Medication Safety: - Group verbal and visual instruction to review commonly prescribed medications for heart and lung disease. Reviews the medication, class of the drug, and side effects. Includes the steps to properly store meds and maintain the prescription regimen. Written material provided at class time.   Intimacy: - Group verbal instruction through game format to discuss how heart and lung disease can affect sexual intimacy. Written material provided at class time.   Know Your Numbers and Heart Failure: - Group verbal and visual instruction to discuss disease risk factors for cardiac and pulmonary disease and treatment options.  Reviews associated critical values for Overweight/Obesity, Hypertension, Cholesterol, and Diabetes.  Discusses basics of heart failure: signs/symptoms and treatments.  Introduces Heart Failure Zone chart for  action plan for heart failure. Written material provided at class time.   Infection Prevention: - Provides verbal and written material to individual with discussion of infection control including proper hand washing and proper equipment cleaning during exercise session. Flowsheet Row Cardiac Rehab from 12/20/2023 in [facility] Cardiac and Pulmonary Rehab  Date 10/31/23  Educator [facility]  Instruction Review Code 1- Verbalizes Understanding    Falls Prevention: - Provides verbal and written material to individual with discussion of falls prevention and safety. Flowsheet Row Cardiac Rehab from 12/20/2023 in [facility] Cardiac and Pulmonary Rehab  Date 10/31/23  Educator [facility]  Instruction Review Code 1- Verbalizes Understanding    Other: -Provides group and verbal instruction on various topics (see comments)   Knowledge Questionnaire Score:  Knowledge Questionnaire Score - 10/31/23 1318       Knowledge Questionnaire Score   Pre Score 25/26          Core Components/Risk Factors/Patient Goals at Admission:  Personal Goals and Risk Factors at Admission - 10/25/23 1348       Core Components/Risk Factors/Patient Goals on Admission    Weight Management Yes;Weight Loss    Intervention Weight Management: Develop a combined nutrition and exercise program designed to reach desired caloric intake, while maintaining appropriate intake of nutrient and fiber, sodium and fats, and appropriate energy expenditure required for the weight goal.;Weight Management: Provide education and appropriate resources to help participant work on and attain dietary goals.    Admit Weight 177 lb (80.3 kg)    Goal Weight: Short Term 176 lb (79.8 kg)    Goal Weight: Long Term 162 lb (73.5 kg)    Expected Outcomes Short Term: Continue to assess and modify interventions until short term weight is achieved;Long Term: Adherence to nutrition and physical activity/exercise program aimed toward attainment of established weight goal;Weight  Loss: Understanding of general recommendations for a balanced deficit meal plan, which promotes 1-2 lb weight loss per week and includes a negative energy balance of [phone] kcal/d    Lipids Yes  Intervention Provide education and support for participant on nutrition & aerobic/resistive exercise along with prescribed medications to achieve LDL 70mg , HDL >40mg .    Expected Outcomes Short Term: Participant states understanding of desired cholesterol values and is compliant with medications prescribed. Participant is following exercise prescription and nutrition guidelines.;Long Term: Cholesterol controlled with medications as prescribed, with individualized exercise RX and with personalized nutrition plan. Value goals: LDL < 70mg , HDL > 40 mg.          Education:Diabetes - Individual verbal and written instruction to review signs/symptoms of diabetes, desired ranges of glucose level fasting, after meals and with exercise. Acknowledge that pre and post exercise glucose checks will be done for 3 sessions at entry of program.   Core Components/Risk Factors/Patient Goals Review:   Goals and Risk Factor Review     Row Name 12/25/23 1124 01/23/24 9177           Core Components/Risk Factors/Patient Goals Review   Personal Goals Review Hypertension;Weight Management/Obesity Weight Management/Obesity;Lipids      Review Pamela Lara does not check her blood pressure on a regular basis because it stable. 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 She takes her meds as prescribed. She is logging her foods and following guidelines given by RD to build muscle and keep weight controlled Pamela Lara is trending downward with her weight and it is fluxuating by around 3 lbs after the weekend. She checks her weight daily at home. She had to stop her cholesterol medication due to it causing hip pain. She will be switching to a new medication after this one gets out of her system.      Expected Outcomes STG: Continue  to log and meet protein goals. LTG: manage risk factors independently Short: Continue to monitor weight and take new medication once able. Long: Manage cardiovascular risk factors independently.         Core Components/Risk Factors/Patient Goals at Discharge (Final Review):   Goals and Risk Factor Review - 01/23/24 9177       Core Components/Risk Factors/Patient Goals Review   Personal Goals Review Weight Management/Obesity;Lipids    Review Pamela Lara is trending downward with her weight and it is fluxuating by around 3 lbs after the weekend. She checks her weight daily at home. She had to stop her cholesterol medication due to it causing hip pain. She will be switching to a new medication after this one gets out of her system.    Expected Outcomes Short: Continue to monitor weight and take new medication once able. Long: Manage cardiovascular risk factors independently.          ITP Comments:  ITP Comments     Row Name 10/25/23 1427 10/31/23 1203 11/12/23 1602 11/28/23 0950 12/26/23 0954   ITP Comments Virtual orientation call completed today. shehas an appointment on Date: 10/31/2023  for EP eval and gym Orientation.  Documentation of diagnosis [facility] in [facility] 10/10/2023 . Completed and gym orientation for cardiac rehab. Initial ITP created and sent for review to Dr. Oneil Pinal, Medical Director. First full day of exercise!  Patient was oriented to gym and equipment including functions, settings, policies, and procedures.  Patient's individual exercise prescription and treatment plan were reviewed.  All starting workloads were established based on the results of the 6 minute walk test done at initial orientation visit.  The plan for exercise progression was also introduced and progression will be customized based on patient's performance and goals. 30  Day review completed. Medical Director ITP review done, changes made as directed, and signed approval by Medical Director. New to program.  30 Day review completed. Medical Director ITP review done; changes made as directed and signed approval by Medical Director.    Row Name 01/23/24 1439 02/11/24 0743         ITP Comments 30 Day review completed. Medical Director ITP review done; changes made as directed and signed approval by Medical Director. Pamela Lara graduated today from  rehab with 36 sessions completed.  Details of the patient's exercise prescription and what She needs to do in order to continue the prescription and progress were discussed with patient.  Patient was given a copy of prescription and goals.  Patient verbalized understanding. Pamela Lara plans to continue to exercise by going to a [employer].         Comments: discharge ITP

## 2024-02-11 NOTE — Progress Notes (Signed)
 Discharge Summary Patient: Pamela Lara DOB: 11-10-1960   Pamela Lara graduated today from  rehab with 36 sessions completed.  Details of the patient's exercise prescription and what Pamela Lara needs to do in order to continue the prescription and progress were discussed with patient.  Patient was given a copy of prescription and goals.  Patient verbalized understanding. Pamela Lara plans to continue to exercise by going to a [employer].   6 Minute Walk     Row Name 10/31/23 1204 01/28/24 0816       6 Minute Walk   Phase Initial Discharge    Distance 1500 feet 1635 feet    Distance % Change -- 9 %    Distance Feet Change -- 135 ft    Walk Time 6 minutes 6 minutes    # of Rest Breaks 0 0    MPH 2.84 3.1    METS 3.32 3.65    RPE 9 13    Perceived Dyspnea  0 0    VO2 Peak 11.6 12.78    Symptoms No No    Resting HR 66 bpm 88 bpm    Resting BP 112/64 100/60    Resting Oxygen Saturation  97 % 97 %    Exercise Oxygen Saturation  during 6 min walk 99 % 98 %    Max Ex. HR 91 bpm 95 bpm    Max Ex. BP 116/62 122/72    2 Minute Post BP 114/62 100/60

## 2024-02-14 ENCOUNTER — Ambulatory Visit
Admission: RE | Admit: 2024-02-14 | Discharge: 2024-02-14 | Disposition: A | Discharge status: Home / Self Care | Source: Ambulatory Visit | Attending: Internal Medicine | Admitting: Internal Medicine

## 2024-02-14 DIAGNOSIS — Z1231 Encounter for screening mammogram for malignant neoplasm of breast: Secondary | ICD-10-CM | POA: Diagnosis not present

## 2024-02-19 ENCOUNTER — Encounter: Payer: Self-pay | Admitting: Internal Medicine

## 2024-02-19 DIAGNOSIS — H9193 Unspecified hearing loss, bilateral: Secondary | ICD-10-CM

## 2024-02-19 MED ORDER — SCOPOLAMINE 1 MG/3DAYS TD PT72
1.0000 | MEDICATED_PATCH | TRANSDERMAL | 1 refills | Status: DC
  Filled 2024-02-19: qty 4, 12d supply, fill #0

## 2024-02-20 ENCOUNTER — Other Ambulatory Visit: Payer: Self-pay

## 2024-02-21 ENCOUNTER — Ambulatory Visit: Attending: Cardiovascular Disease | Admitting: Pharmacist

## 2024-02-21 ENCOUNTER — Other Ambulatory Visit: Payer: Self-pay

## 2024-02-21 DIAGNOSIS — E781 Pure hyperglyceridemia: Secondary | ICD-10-CM

## 2024-02-21 DIAGNOSIS — I2510 Atherosclerotic heart disease of native coronary artery without angina pectoris: Secondary | ICD-10-CM

## 2024-02-21 MED ORDER — ROSUVASTATIN CALCIUM 5 MG PO TABS
5.0000 mg | ORAL_TABLET | Freq: Every day | ORAL | 3 refills | Status: AC
  Filled 2024-02-21 (×2): qty 90, 90d supply, fill #0

## 2024-02-21 NOTE — Assessment & Plan Note (Addendum)
 Assessment: LDL-C is above goal of <70 Patient intolerant to atorvastatin  and rosuvastatin 20mg  and 10mg  We did discuss Repatha, but there is concern about cost We discussed trying rosuvastatin at 5mg  daily Patient exercises daily, just finished cardiac rehab  Plan: Start rosuvastatin 5mg  daily Repeat labs in 3 months

## 2024-02-21 NOTE — Patient Instructions (Signed)
 Start taking rosuvastatin 5mg  daily Please let me know if there are any issues [phone] Continue with exercise

## 2024-02-21 NOTE — Progress Notes (Signed)
 Patient ID: Pamela Lara                 DOB: 1960/09/10                    MRN: [id number]      HPI: Pamela Lara is a 63 y.o. female patient of Dr. Darron referred to lipid clinic by Medford Meager, NP. PMH is significant for CAD, hyperlipidemia, GERD, vertigo, arthritis, and seasonal affective disorder.  In June, CT angiogram showed severe proximal LAD disease with an FFRct of 0.64. She underwent diagnostic catheterization which confirmed severe proximal LAD disease and this was successfully treated with drug-eluting stent. The first diagonal was treated with drug-coated balloon angioplasty and she was subsequently discharged home on dual antiplatelet therapy. Patient started on atorvastatin  after PCI. However in Sept she reported hip and shoulder pain. Med was held and pain improved. She was tried on rosuvastatin 20g daily which also resulted in hip pain.  Patient presents today to lipid clinic. She is concerned about the cost of Repatha next year. She has currently hit her OOP max this year, but next year she will have another several thousand deductible to meet. Next year with [facility] copay cards will not go towards deductibles or OOP max.   After trying rosuvastatin 20mg , she did try taking 10mg  for a few days. Said that the pain was better, but still there.   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  Current Medications: none Intolerances: atorvastatin  (hip/shoulder pain), rosuvastatin 20mg  daily(hip pain) Risk Factors: CAD LDL-C goal: <70 ApoB goal: <80  Diet: states she eats a heart healthy diet  Exercise: daily- walk, elliptical, 10,000 steps daily when she works  Family History:  Family History  Problem Relation Age of Onset   Cancer Mother    Arthritis Mother    Hypertension Mother    Cancer Father 32       Mesothelioma   Arthritis Father    Hypertension Father    Breast cancer Paternal Aunt      Social History: no tobacco, occasional ETOH  Labs: Lipid Panel      Component Value Date/Time   CHOL 176 02/07/2024 1101   CHOL 153 04/10/2012 0817   TRIG 152 (H) 02/07/2024 1101   TRIG 295 (H) 04/10/2012 0817   HDL 54 02/07/2024 1101   HDL 43 04/10/2012 0817   CHOLHDL 3.3 02/07/2024 1101   CHOLHDL 3.4 11/20/2023 0846   VLDL 35 11/20/2023 0846   VLDL 59 (H) 04/10/2012 0817   LDLCALC 96 02/07/2024 1101   LDLCALC 51 04/10/2012 0817   LDLDIRECT 136.0 10/05/2022 0949   LABVLDL 26 02/07/2024 1101    Past Medical History:  Diagnosis Date   Arthritis    right shoulder   BPPV (benign paroxysmal positional vertigo)    CAD (coronary artery disease)    a. 09/2023 Cor CTA: Sev prox/mid LAD dzs w/ FFRct 0.64; b. 09/2023 PCI: LM 99p/m (3.5x20 Synergy DES), 55m, D1 99 (jailed-->DCBA), RI nl, LCX nl, RCA nl, EF 55-65%.   Congenital bowed legs    s/p  surgical correction   Diverticulitis, colon    recurrent   Dysplastic nevus 09/09/2018   R mid post thigh   Dysplastic nevus 07/09/2019   R buttock,moderate atypia   Dysplastic nevus 07/09/2019   L buttock, moderate atypia   Dysplastic nevus 07/09/2019   R lat popliteal, moderate atypia   Ectopic pregnancy 01/29/2000   GERD (gastroesophageal reflux disease)  watch diet (as needed takes apple cider vinager)   History of colonic diverticulitis    hx multiple acute diverticulitis episodes treated medically   History of ectopic pregnancy    History of Helicobacter pylori infection 2013  approx.   Melanoma (HCC) 04/19/2018   R prox bicep (MMIS)   PONV (postoperative nausea and vomiting)    Seasonal affective disorder     Current Outpatient Medications on File Prior to Visit  Medication Sig Dispense Refill   aspirin  EC 81 MG tablet Take 81 mg by mouth daily. Swallow whole.     calcium  carbonate (TUMS - DOSED IN MG ELEMENTAL CALCIUM ) 500 MG chewable tablet Chew 1 tablet by mouth as needed for indigestion or heartburn.     Korean Ginseng 1000 MG TABS Take 1,000 mg by mouth daily.      Multiple Vitamin  (MULTIVITAMIN) tablet Take 1 tablet by mouth daily.     nitroGLYCERIN  (NITROSTAT ) 0.4 MG SL tablet Place 1 tablet (0.4 mg total) under the tongue every 5 (five) minutes as needed for chest pain. 25 tablet 3   Omega-3 Fatty Acids (FISH OIL) 1000 MG CAPS Take 1,800 mg by mouth daily. 900 mg each     prasugrel  (EFFIENT ) 10 MG TABS tablet Take 1 tablet (10 mg total) by mouth daily. 90 tablet 3   Psyllium (METAMUCIL FIBER PO) Take 15 mLs by mouth every evening.     acetaminophen  (TYLENOL ) 500 MG tablet Take 1,000 mg by mouth every 6 (six) hours as needed for moderate pain (pain score 4-6) or mild pain (pain score 1-3).     influenza vac split quadrivalent PF (FLUARIX  QUADRIVALENT) 0.5 ML injection Inject into the muscle. 0.5 mL 0   omeprazole  (PRILOSEC) 20 MG capsule Take 20 mg by mouth daily. (Patient taking differently: Take 20 mg by mouth 2 (two) times daily as needed.)     scopolamine  (TRANSDERM-SCOP) 1 MG/3DAYS Place 1 patch (1 mg total) onto the skin every 3 (three) days. 4 patch 1   No current facility-administered medications on file prior to visit.    Allergies  Allergen Reactions   Ivp Dye [Iodinated Contrast Media] Shortness Of Breath   Morphine And Codeine Nausea And Vomiting   Other     General anesthesia makes her nauseated with vomiting     Wellbutrin  [Bupropion ] Other (See Comments)    Joint pain    Assessment/Plan:  1. 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

## 2024-03-20 ENCOUNTER — Other Ambulatory Visit: Payer: Self-pay

## 2024-03-20 ENCOUNTER — Encounter: Payer: Self-pay | Admitting: Cardiovascular Disease

## 2024-03-20 ENCOUNTER — Ambulatory Visit: Attending: Cardiovascular Disease | Admitting: Cardiovascular Disease

## 2024-03-20 VITALS — BP 110/76 | HR 64 | Ht 65.0 in | Wt 175.0 lb

## 2024-03-20 DIAGNOSIS — H6981 Other specified disorders of Eustachian tube, right ear: Secondary | ICD-10-CM | POA: Diagnosis not present

## 2024-03-20 DIAGNOSIS — E781 Pure hyperglyceridemia: Secondary | ICD-10-CM

## 2024-03-20 DIAGNOSIS — I2510 Atherosclerotic heart disease of native coronary artery without angina pectoris: Secondary | ICD-10-CM

## 2024-03-20 DIAGNOSIS — E785 Hyperlipidemia, unspecified: Secondary | ICD-10-CM | POA: Diagnosis not present

## 2024-03-20 DIAGNOSIS — R490 Dysphonia: Secondary | ICD-10-CM | POA: Diagnosis not present

## 2024-03-20 DIAGNOSIS — H903 Sensorineural hearing loss, bilateral: Secondary | ICD-10-CM | POA: Diagnosis not present

## 2024-03-20 MED ORDER — FLUTICASONE PROPIONATE 50 MCG/ACT NA SUSP
2.0000 | Freq: Every day | NASAL | 11 refills | Status: AC
  Filled 2024-03-20: qty 16, 30d supply, fill #0

## 2024-03-20 MED ORDER — EZETIMIBE 10 MG PO TABS
10.0000 mg | ORAL_TABLET | Freq: Every day | ORAL | 3 refills | Status: AC
  Filled 2024-03-20: qty 90, 90d supply, fill #0
  Filled 2024-05-27: qty 90, 90d supply, fill #1

## 2024-03-20 NOTE — Progress Notes (Signed)
 Cardiology Office Note   Date:  03/20/2024   ID:  MAKINA SKOW, DOB 1960/12/24, MRN [id number]  PCP:  Marylynn Verneita LITTIE, MD  Cardiologist:   Deatrice Cage, MD   Chief Complaint  Patient presents with   Follow-up    3 month f/u discuss cholesterol medication. Meds reviewed verbally with pt.      History of Present Illness: Pamela Lara is a 63 y.o. female who is here today for a follow-up visit regarding coronary artery disease and hyperlipidemia.    She has known history of hyperlipidemia, GERD and previous tobacco use.   She was seen in June for new anginal symptoms manifested by chest tightness and pressure radiating to the right side of the neck and associated with shortness of breath.  She had ED evaluation that included normal troponin and no evidence of pulmonary embolism.  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  Her EKG showed evidence of anterior wall ischemia with T wave inversion. She had cardiac CTA done which showed a calcium  score of 2.06 with severe proximal LAD stenosis . I performed cardiac catheterization via the left radial artery which showed severe 99% stenosis in the proximal LAD involving a large first diagonal.  The LAD was treated successfully with drug-eluting stent placement in the diagonal was treated with drug-coated balloon angioplasty. She has been doing very well since then with no recurrent chest pain.  She attended cardiac rehab.  She did not tolerate atorvastatin  or rosuvastatin due to myalgia but is tolerating 5 mg of rosuvastatin at the present time.  Past Medical History:  Diagnosis Date   Arthritis    right shoulder   BPPV (benign paroxysmal positional vertigo)    CAD (coronary artery disease)    a. 09/2023 Cor CTA: Sev prox/mid LAD dzs w/ FFRct 0.64; b. 09/2023 PCI: LM 99p/m (3.5x20 Synergy DES), 72m, D1 99 (jailed-->DCBA), RI nl, LCX nl, RCA nl, EF 55-65%.   Congenital bowed  legs    s/p  surgical correction   Diverticulitis, colon    recurrent   Dysplastic nevus 09/09/2018   R mid post thigh   Dysplastic nevus 07/09/2019   R buttock,moderate atypia   Dysplastic nevus 07/09/2019   L buttock, moderate atypia   Dysplastic nevus 07/09/2019   R lat popliteal, moderate atypia   Ectopic pregnancy 01/29/2000   GERD (gastroesophageal reflux disease)    watch diet (as needed takes apple cider vinager)   History of colonic diverticulitis    hx multiple acute diverticulitis episodes treated medically   History of ectopic pregnancy    History of Helicobacter pylori infection 2013  approx.   Melanoma (HCC) 04/19/2018   R prox bicep (MMIS)   PONV (postoperative nausea and vomiting)    Seasonal affective disorder     Past Surgical History:  Procedure Laterality Date   CHOLECYSTECTOMY OPEN  1984   COLON SURGERY  April 2019   Sigmoid colon resection   COLONOSCOPY WITH PROPOFOL  N/A 04/19/2017   Procedure: COLONOSCOPY WITH PROPOFOL ;  Surgeon: Jinny Carmine, MD;  Location: [facility];  Service: Endoscopy;  Laterality: N/A;   COLONOSCOPY WITH PROPOFOL  N/A 02/16/2023   Procedure: COLONOSCOPY WITH PROPOFOL ;  Surgeon: Jinny Carmine, MD;  Location: [facility];  Service: Endoscopy;  Laterality: N/A;   CORONARY STENT INTERVENTION N/A 10/10/2023   Procedure: CORONARY STENT INTERVENTION;  Surgeon: Cage Deatrice LABOR, MD;  Location: [facility];  Service: Cardiovascular;  Laterality: N/A;  LAD and DIAG   CYSTOSCOPY WITH STENT PLACEMENT Bilateral 08/15/2017   Procedure: CYSTOSCOPY WITH  FOLEY PLACEMENT WITH BILATERAL open ended catheter placement.  catheters removed at the end of the case.;  Surgeon: Carolee Sherwood JONETTA DOUGLAS, MD;  Location: [facility];  Service: Urology;  Laterality: Bilateral;   EXCISIONAL SENTINAL LYMPH NODE BIOPSY, NECK N/A 2016  approx.   per pt thyroid  area , benign   LAPAROSCOPIC SIGMOID COLECTOMY N/A 08/15/2017   Procedure: LAPAROSCOPIC  SIGMOID COLECTOMY  ERAS PATHWAY;  Surgeon: Teresa Lonni HERO, MD;  Location: [facility];  Service: General;  Laterality: N/A;   LAPAROSCOPY FOR ECTOPIC PREGNANCY  2001   also  BILATERAL TUBAL LIGATION   LEFT HEART CATH AND CORONARY ANGIOGRAPHY N/A 10/10/2023   Procedure: LEFT HEART CATH AND CORONARY ANGIOGRAPHY;  Surgeon: Darron Deatrice LABOR, MD;  Location: [facility];  Service: Cardiovascular;  Laterality: N/A;   OSTEOTOMY Bilateral 1980 and 1981   correction congenital bow legged   POLYPECTOMY N/A 02/16/2023   Procedure: POLYPECTOMY;  Surgeon: Jinny Carmine, MD;  Location: [facility];  Service: Endoscopy;  Laterality: N/A;   TONSILLECTOMY     TUBAL LIGATION       Current Outpatient Medications  Medication Sig Dispense Refill   acetaminophen  (TYLENOL ) 500 MG tablet Take 1,000 mg by mouth every 6 (six) hours as needed for moderate pain (pain score 4-6) or mild pain (pain score 1-3).     aspirin  EC 81 MG tablet Take 81 mg by mouth daily. Swallow whole.     calcium  carbonate (TUMS - DOSED IN MG ELEMENTAL CALCIUM ) 500 MG chewable tablet Chew 1 tablet by mouth as needed for indigestion or heartburn.     influenza vac split quadrivalent PF (FLUARIX  QUADRIVALENT) 0.5 ML injection Inject into the muscle. 0.5 mL 0   Korean Ginseng 1000 MG TABS Take 1,000 mg by mouth daily.      Multiple Vitamin (MULTIVITAMIN) tablet Take 1 tablet by mouth daily.     nitroGLYCERIN  (NITROSTAT ) 0.4 MG SL tablet Place 1 tablet (0.4 mg total) under the tongue every 5 (five) minutes as needed for chest pain. 25 tablet 3   Omega-3 Fatty Acids (FISH OIL) 1000 MG CAPS Take 1,800 mg by mouth daily. 900 mg each     omeprazole  (PRILOSEC) 20 MG capsule Take 20 mg by mouth daily. (Patient taking differently: Take 20 mg by mouth 2 (two) times daily as needed.)     prasugrel  (EFFIENT ) 10 MG TABS tablet Take 1 tablet (10 mg total) by mouth daily. 90 tablet 3   Psyllium (METAMUCIL FIBER PO) Take 15 mLs by mouth every  evening.     rosuvastatin (CRESTOR) 5 MG tablet Take 1 tablet (5 mg total) by mouth daily. 90 tablet 3   No current facility-administered medications for this visit.    Allergies:   Ivp dye [iodinated contrast media], Atorvastatin , Morphine and codeine, Other, and Wellbutrin  [bupropion ]    Social History:  The patient  reports that she quit smoking about 6 years ago. Her smoking use included cigarettes. She started smoking about 44 years ago. She has a 9.5 pack-year smoking history. She has never used smokeless tobacco. She reports current alcohol use of about 3.0 standard drinks of alcohol per week. She reports that she does not use drugs.   Family History:  The patient's family history includes Arthritis in her father and mother; Breast cancer in her paternal aunt; Cancer in her mother; Cancer (  age of onset: 47) in her father; Hypertension in her father and mother.    ROS:  Please see the history of present illness.   Otherwise, review of systems are positive for none.   All other systems are reviewed and negative.    PHYSICAL EXAM: VS:  BP 110/76 (BP Location: Left Arm, Patient Position: Sitting, Cuff Size: Normal)   Pulse 64   Ht 5' 5 (1.651 m)   Wt 175 lb (79.4 kg)   LMP 10/03/2015 (Approximate)   SpO2 96%   BMI 29.12 kg/m  , BMI Body mass index is 29.12 kg/m. GEN: Well nourished, well developed, in no acute distress  HEENT: normal  Neck: no JVD, carotid bruits, or masses Cardiac: RRR; no murmurs, rubs, or gallops,no edema  Respiratory:  clear to auscultation bilaterally, normal work of breathing GI: soft, nontender, nondistended, + BS MS: no deformity or atrophy  Skin: warm and dry, no rash Neuro:  Strength and sensation are intact Psych: euthymic mood, full affect   EKG:  EKG is  ordered today. The ekg ordered today demonstrates : Normal sinus rhythm Normal ECG When compared with ECG of 26-Oct-2023 14:09, No significant change was found         Recent  Labs: 10/11/2023: BUN 12; Creatinine, Ser 0.77; Hemoglobin 14.0; Platelets 204; Potassium 3.4; Sodium 139 02/07/2024: ALT 40    Lipid Panel    Component Value Date/Time   CHOL 176 02/07/2024 1101   CHOL 153 04/10/2012 0817   TRIG 152 (H) 02/07/2024 1101   TRIG 295 (H) 04/10/2012 0817   HDL 54 02/07/2024 1101   HDL 43 04/10/2012 0817   CHOLHDL 3.3 02/07/2024 1101   CHOLHDL 3.4 11/20/2023 0846   VLDL 35 11/20/2023 0846   VLDL 59 (H) 04/10/2012 0817   LDLCALC 96 02/07/2024 1101   LDLCALC 51 04/10/2012 0817   LDLDIRECT 136.0 10/05/2022 0949      Wt Readings from Last 3 Encounters:  03/20/24 175 lb (79.4 kg)  01/30/24 176 lb 1.6 oz (79.9 kg)  01/28/24 176 lb 1.6 oz (79.9 kg)           No data to display            ASSESSMENT AND PLAN:  1.  Coronary artery disease involving native coronary arteries without angina: Status post PCI and drug-eluting stent placement to the proximal LAD with drug-coated balloon angioplasty to the first diagonal.  Recommend aspirin  indefinitely and prasugrel  until June 2025.  2.  Hyperlipidemia: Intolerance to high-dose statins including rosuvastatin and atorvastatin .  She is currently tolerating rosuvastatin 5 mg once daily but I suspect that this is not going to get her to a goal LDL of less than 55.  I elected to add ezetimibe 10 mg once daily.  Check lipid and liver profile in 1 month and will consider a PCSK9  inhibitor if she is not at goal.  She was already seen by our Pharm.D. clinic.    Disposition:   FU in 6 months.  Signed,  Deatrice Cage, MD  03/20/2024 10:01 AM    [facility]

## 2024-03-20 NOTE — Patient Instructions (Signed)
 Medication Instructions:  START Zetia 10 mg once daily  *If you need a refill on your cardiac medications before your next appointment, please call your pharmacy*  Lab Work: Your provider would like for you to return in one month to have the following labs drawn: Lipid and Liver.   Please go to [facility] [address] ([facility] [address], [place] [address] You do not need an appointment.  They are open from 8 am- 4:30 pm.  Lunch from 1:00 pm- 2:00 pm You will need to be fasting.   You may also go to one of the following [facility]:  [address] [place], [place] [address] Phone: [phone] Lab hours: Mon-Fri 8 am- 5 pm    Lunch 12 pm- 1 pm  [address] [place],  [place]  [address]  [place]  Phone: [phone] Lab hours: 7 am- 4 pm Lunch 12 pm-1 pm   [address] [place],  [place]  [address]  [place]  Phone: [phone] Lab hours: Mon-Fri 8 am- 5 pm    Lunch 12 pm- 1 pm  If you have labs (blood work) drawn today and your tests are completely normal, you will receive your results only by: MyChart Message (if you have MyChart) OR A paper copy in the mail If you have any lab test that is abnormal or we need to change your treatment, we will call you to review the results.  Testing/Procedures: None ordered  Follow-Up: At [facility], you and your health needs are our priority.  As part of our continuing mission to provide you with exceptional heart care, our providers are all part of one team.  This team includes your primary Cardiologist (physician) and Advanced Practice Providers or APPs (Physician Assistants and Nurse Practitioners) who all work together to provide you with the care you need, when you need it.  Your next appointment:   6 month(s)  Provider:   You may see Deatrice Cage, MD or one of the following Advanced Practice Providers on your designated Care Team:   Lonni Meager, NP Lesley Maffucci, PA-C Bernardino Bring, PA-C [place], PA-C Tylene Lunch, NP Barnie Hila, NP    We recommend signing up for the patient portal called MyChart.  Sign up information is provided on this After Visit Summary.  MyChart is used to connect with patients for Virtual Visits (Telemedicine).  Patients are able to view lab/test results, encounter notes, upcoming appointments, etc.  Non-urgent messages can be sent to your provider as well.   To learn more about what you can do with MyChart, go to [email or web address].

## 2024-03-24 ENCOUNTER — Other Ambulatory Visit: Payer: Self-pay

## 2024-03-24 MED ORDER — METHYLPREDNISOLONE 4 MG PO TBPK
ORAL_TABLET | ORAL | 0 refills | Status: AC
  Filled 2024-03-24: qty 21, 6d supply, fill #0

## 2024-04-01 ENCOUNTER — Ambulatory Visit: Attending: Student | Admitting: Speech Pathology

## 2024-04-01 DIAGNOSIS — R490 Dysphonia: Secondary | ICD-10-CM | POA: Insufficient documentation

## 2024-04-01 NOTE — Therapy (Signed)
 OUTPATIENT SPEECH LANGUAGE PATHOLOGY  VOICE EVALUATION   Patient Name: Pamela Lara MRN: [id number] DOB:November 26, 1960, 63 y.o., female Today's Date: 04/01/2024  PCP: Verneita Kettering, MD REFERRING PROVIDER: Almarie Roe, PA   End of Session - 04/01/24 0854     Visit Number 1    Number of Visits 17    Date for Recertification  05/27/24    Authorization Type [employer]    Progress Note Due on Visit 10    SLP Start Time 0845    SLP Stop Time  0930    SLP Time Calculation (min) 45 min    Activity Tolerance Patient tolerated treatment well          Past Medical History:  Diagnosis Date   Arthritis    right shoulder   BPPV (benign paroxysmal positional vertigo)    CAD (coronary artery disease)    a. 09/2023 Cor CTA: Sev prox/mid LAD dzs w/ FFRct 0.64; b. 09/2023 PCI: LM 99p/m (3.5x20 Synergy DES), 64m, D1 99 (jailed-->DCBA), RI nl, LCX nl, RCA nl, EF 55-65%.   Congenital bowed legs    s/p  surgical correction   Diverticulitis, colon    recurrent   Dysplastic nevus 09/09/2018   R mid post thigh   Dysplastic nevus 07/09/2019   R buttock,moderate atypia   Dysplastic nevus 07/09/2019   L buttock, moderate atypia   Dysplastic nevus 07/09/2019   R lat popliteal, moderate atypia   Ectopic pregnancy 01/29/2000   GERD (gastroesophageal reflux disease)    watch diet (as needed takes apple cider vinager)   History of colonic diverticulitis    hx multiple acute diverticulitis episodes treated medically   History of ectopic pregnancy    History of Helicobacter pylori infection 2013  approx.   Melanoma (HCC) 04/19/2018   R prox bicep (MMIS)   PONV (postoperative nausea and vomiting)    Seasonal affective disorder    Past Surgical History:  Procedure Laterality Date   CHOLECYSTECTOMY OPEN  1984   COLON SURGERY  April 2019   Sigmoid colon resection   COLONOSCOPY WITH PROPOFOL  N/A 04/19/2017   Procedure: COLONOSCOPY WITH PROPOFOL ;  Surgeon: Jinny Carmine, MD;   Location: [facility];  Service: Endoscopy;  Laterality: N/A;   COLONOSCOPY WITH PROPOFOL  N/A 02/16/2023   Procedure: COLONOSCOPY WITH PROPOFOL ;  Surgeon: Jinny Carmine, MD;  Location: [facility];  Service: Endoscopy;  Laterality: N/A;   CORONARY STENT INTERVENTION N/A 10/10/2023   Procedure: CORONARY STENT INTERVENTION;  Surgeon: Darron Deatrice LABOR, MD;  Location: [facility];  Service: Cardiovascular;  Laterality: N/A;  LAD and DIAG   CYSTOSCOPY WITH STENT PLACEMENT Bilateral 08/15/2017   Procedure: CYSTOSCOPY WITH  FOLEY PLACEMENT WITH BILATERAL open ended catheter placement.  catheters removed at the end of the case.;  Surgeon: Carolee Sherwood JONETTA DOUGLAS, MD;  Location: [facility];  Service: Urology;  Laterality: Bilateral;   EXCISIONAL SENTINAL LYMPH NODE BIOPSY, NECK N/A 2016  approx.   per pt thyroid  area , benign   LAPAROSCOPIC SIGMOID COLECTOMY N/A 08/15/2017   Procedure: LAPAROSCOPIC SIGMOID COLECTOMY  ERAS PATHWAY;  Surgeon: Teresa Lonni HERO, MD;  Location: [facility];  Service: General;  Laterality: N/A;   LAPAROSCOPY FOR ECTOPIC PREGNANCY  2001   also  BILATERAL TUBAL LIGATION   LEFT HEART CATH AND CORONARY ANGIOGRAPHY N/A 10/10/2023   Procedure: LEFT HEART CATH AND CORONARY ANGIOGRAPHY;  Surgeon: Darron Deatrice LABOR, MD;  Location: [facility];  Service: Cardiovascular;  Laterality:  N/A;   OSTEOTOMY Bilateral 1980 and 1981   correction congenital bow legged   POLYPECTOMY N/A 02/16/2023   Procedure: POLYPECTOMY;  Surgeon: Jinny Carmine, MD;  Location: [facility];  Service: Endoscopy;  Laterality: N/A;   TONSILLECTOMY     TUBAL LIGATION     Patient Active Problem List   Diagnosis Date Noted   CAD (coronary atherosclerotic disease) 10/10/2023   Intermittent right-sided chest pain 09/24/2023   Tubular adenoma of colon 10/05/2022   Hypertriglyceridemia without hypercholesterolemia 09/22/2020   Melanoma of skin (HCC) 10/09/2019   History of 2019 novel  coronavirus disease (COVID-19) 04/19/2019   Colon cancer screening    Polyp of sigmoid colon    Rectal polyp    Hepatic steatosis 08/12/2016   S/P partial colectomy 04/11/2016   Left thyroid  nodule 04/30/2014   Cervical cancer screening 04/05/2013   GERD (gastroesophageal reflux disease) 04/24/2012   External hemorrhoid 04/24/2012   Encounter for preventive health examination 03/29/2012   History of tobacco abuse 12/21/2010   S/P tubal ligation 01/29/2000   S/P chole 11/30/1983    ONSET DATE:  date of referral 03/24/2024  REFERRING DIAG: R49.0 (ICD-10-CM) - Dysphonia   THERAPY DIAG:  Dysphonia  Rationale for Evaluation and Treatment Rehabilitation  SUBJECTIVE:   SUBJECTIVE STATEMENT: Pt pleasant, great historian, concerned about aspiration in addition to reduced vocal intensity Pt accompanied by: self  PERTINENT HISTORY: Pt is a 63 year old female with past medical history or smoking (stopped in 2019), GERD, coronary artery disease and hyperlipidemia. Pt states that she has had a fair number of intubations.   DIAGNOSTIC FINDINGS:  ENT evaluation dated 03/20/2024 Laryngoscopy revealed Patient has bowing of bilateral vocal cords on endoscopy today. She notes she will aspirate occasionally.   PAIN:  Are you having pain? No   FALLS: Has patient fallen in last 6 months? No,   LIVING ENVIRONMENT: Lives with: lives with their spouse Lives in: House/apartment  PLOF: Independent  PATIENT GOALS    to improve voice and reduce s/s of aspiration  OBJECTIVE:  COGNITION: Overall cognitive status: Within functional limits for tasks assessed  SOCIAL HISTORY: Occupation: nurse in cardiovascular Water  intake: suboptimal Caffeine/alcohol intake: minimal Daily voice use: excessive Environmental risks: Noisy environment, Stressful environment, and Dry or dusty environment Occupational risks: Other:  Misuse: Speaks excessively, Excessively low pitch, Strain, Tension, Speaks  without adequate warm-up, Speaks without adequate breath support, and Speaks on residual capacity Phonotraumatic behaviors: Speaks in noise, Speaks at a great distance from listeners, Excessive voice use, and Excessive and/or habitual throat clearing  PERCEPTUAL VOICE ASSESSMENT: Voice quality: rough, low vocal intensity, and vocal fatigue Vocal abuse: habitual throat clearing, abnormal breathing pattern, excessive voice use, and habitual abnormal pitch Resonance: normal Respiratory function: clavicular breathing  OBJECTIVE VOICE ASSESSMENT: Sustained ah maximum phonation time: 13 seconds Sustained ah loudness average: 72 dB Average fundamental frequency during sustained "ah":155 Hz   (3.30 SD below average of  244 Hz +/- 27 for gender)  Oral reading (passage) loudness average: 75 dB Conversational pitch average: 169 Hz Conversational pitch range: 169 Hz Conversational loudness average: 75 dB S/z ratio: .91 (Suggestive of dysfunction >1.0) Voice quality: hoarse, harsh, strained, low vocal intensity, and vocal fatigue     ORAL MOTOR EXAMINATION Facial : WFL Lingual: WFL Velum: WFL Mandible: WFL Cough: WFL Voice: Hoarse, Strained, Breathy  Pt states that she is experience some aspiration especially when consuming oranges. ST provided education on vocal fold closure and role within swallowing. Will  continue to monitor throughout ST sessions.    PATIENT REPORTED OUTCOME MEASURES (PROM):  VOICE HANDICAP INDEX (VHI)  The Voice Handicap Index is comprised of a series of questions to assess the patient's perception of their voice. It is designed to evaluate the emotional, physical and functional components of the voice problem.  Functional: 15 Physical: 22 Emotional: 19 Total: 56 (Normal mean 8.75, SD =14.97)  z score =  3.16 (total score -8.75 / 14.97) severe = 3.00+   TODAY'S TREATMENT:  Skilled verbal and written instruction provided on throat clearing and suppression  techniques   PATIENT EDUCATION: Education details: results of this assessment, ST POC Person educated: Patient Education method: Explanation and Handouts Education comprehension: verbalized understanding and needs further education   HOME EXERCISE PROGRAM: Practice suppression techniques for throat clears     GOALS: Goals reviewed with patient? Yes  SHORT TERM GOALS: Target date: 10 sessions  The patient will maximize voice quality and loudness using breath support/oral resonance for sustained vowel production, pitch glides, and hierarchal speech drill.  Baseline: Goal status: INITIAL  2.  The patient will demonstrate abdominal breathing patterns and steady release of breath on exhalation to optimize efficiency of voicing and decrease laryngeal hyperfunction.  Baseline:  Goal status: INITIAL  3.  The patient will increase hydration for an eventual goal of 6-8 glasses per day and limit caffeine intake (to maximum of 1-2, 8 oz cups/day), as measured by patient report.  Baseline:  Goal status: INITIAL  4.  Pt will discriminate healthy use from misuse of the voice in various situations with 80% accuracy across 3 data collections.  Baseline:  Goal status: INITIAL   LONG TERM GOALS: Target date: 05/27/2024  The patient will be independent for abdominal breathing and breath support exercises.  Baseline:  Goal status: INITIAL  2.  The patient will demonstrate independent understanding of vocal hygiene concepts.  Baseline:  Goal status: INITIAL  3.  Patient will report improved communication effectiveness as measured by PROM Baseline:  Goal status: INITIAL   ASSESSMENT:  CLINICAL IMPRESSION: Patient is a 63 y.o. 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 SABRA Pt presents with mild dysphonia that is c/b hoarse, breathy, mildly strained that results in reduced ability to project her voice. This is further  compounded by presence of what appears to be a habitual throat clear.   Pt works as a transport planner and relys on her voice to communicate medical information to patients.   OBJECTIVE IMPAIRMENTS include voice disorder. These impairments are limiting patient from effectively communicating at home and in community. Factors affecting potential to achieve goals and functional outcome are N/A. Patient will benefit from skilled SLP services to address above impairments and improve overall function.  REHAB POTENTIAL: Excellent  PLAN: SLP FREQUENCY: 1-2x/week  SLP DURATION: 8 weeks  PLANNED INTERVENTIONS: Aspiration precaution training, SLP instruction and feedback, Compensatory strategies, and Patient/family education   Orvel Cutsforth B. Rubbie, M.S., CCC-SLP, Tree Surgeon Certified Brain Injury Specialist [facility]  [facility] Rehabilitation Services Office [phone] Ascom [phone] Fax [phone]

## 2024-04-03 ENCOUNTER — Ambulatory Visit: Admitting: Speech Pathology

## 2024-04-04 ENCOUNTER — Ambulatory Visit: Admitting: Speech Pathology

## 2024-04-04 DIAGNOSIS — R490 Dysphonia: Secondary | ICD-10-CM | POA: Diagnosis not present

## 2024-04-04 NOTE — Therapy (Signed)
 OUTPATIENT SPEECH LANGUAGE PATHOLOGY  VOICE TREATMENT NOTE   Patient Name: Pamela Lara MRN: [id number] DOB:1960-09-29, 63 y.o., female Today's Date: 04/04/2024  PCP: Verneita Kettering, MD REFERRING PROVIDER: Almarie Roe, PA   End of Session - 04/04/24 1624     Visit Number 2    Number of Visits 17    Date for Recertification  05/27/24    Authorization Type [employer]    Progress Note Due on Visit 10    SLP Start Time [phone]    SLP Stop Time  0900    SLP Time Calculation (min) 50 min    Activity Tolerance Patient tolerated treatment well          Past Medical History:  Diagnosis Date   Arthritis    right shoulder   BPPV (benign paroxysmal positional vertigo)    CAD (coronary artery disease)    a. 09/2023 Cor CTA: Sev prox/mid LAD dzs w/ FFRct 0.64; b. 09/2023 PCI: LM 99p/m (3.5x20 Synergy DES), 22m, D1 99 (jailed-->DCBA), RI nl, LCX nl, RCA nl, EF 55-65%.   Congenital bowed legs    s/p  surgical correction   Diverticulitis, colon    recurrent   Dysplastic nevus 09/09/2018   R mid post thigh   Dysplastic nevus 07/09/2019   R buttock,moderate atypia   Dysplastic nevus 07/09/2019   L buttock, moderate atypia   Dysplastic nevus 07/09/2019   R lat popliteal, moderate atypia   Ectopic pregnancy 01/29/2000   GERD (gastroesophageal reflux disease)    watch diet (as needed takes apple cider vinager)   History of colonic diverticulitis    hx multiple acute diverticulitis episodes treated medically   History of ectopic pregnancy    History of Helicobacter pylori infection 2013  approx.   Melanoma (HCC) 04/19/2018   R prox bicep (MMIS)   PONV (postoperative nausea and vomiting)    Seasonal affective disorder    Past Surgical History:  Procedure Laterality Date   CHOLECYSTECTOMY OPEN  1984   COLON SURGERY  April 2019   Sigmoid colon resection   COLONOSCOPY WITH PROPOFOL  N/A 04/19/2017   Procedure: COLONOSCOPY WITH PROPOFOL ;  Surgeon: Jinny Carmine, MD;   Location: [facility];  Service: Endoscopy;  Laterality: N/A;   COLONOSCOPY WITH PROPOFOL  N/A 02/16/2023   Procedure: COLONOSCOPY WITH PROPOFOL ;  Surgeon: Jinny Carmine, MD;  Location: [facility];  Service: Endoscopy;  Laterality: N/A;   CORONARY STENT INTERVENTION N/A 10/10/2023   Procedure: CORONARY STENT INTERVENTION;  Surgeon: Darron Deatrice LABOR, MD;  Location: [facility];  Service: Cardiovascular;  Laterality: N/A;  LAD and DIAG   CYSTOSCOPY WITH STENT PLACEMENT Bilateral 08/15/2017   Procedure: CYSTOSCOPY WITH  FOLEY PLACEMENT WITH BILATERAL open ended catheter placement.  catheters removed at the end of the case.;  Surgeon: Carolee Sherwood JONETTA DOUGLAS, MD;  Location: [facility];  Service: Urology;  Laterality: Bilateral;   EXCISIONAL SENTINAL LYMPH NODE BIOPSY, NECK N/A 2016  approx.   per pt thyroid  area , benign   LAPAROSCOPIC SIGMOID COLECTOMY N/A 08/15/2017   Procedure: LAPAROSCOPIC SIGMOID COLECTOMY  ERAS PATHWAY;  Surgeon: Teresa Lonni HERO, MD;  Location: [facility];  Service: General;  Laterality: N/A;   LAPAROSCOPY FOR ECTOPIC PREGNANCY  2001   also  BILATERAL TUBAL LIGATION   LEFT HEART CATH AND CORONARY ANGIOGRAPHY N/A 10/10/2023   Procedure: LEFT HEART CATH AND CORONARY ANGIOGRAPHY;  Surgeon: Darron Deatrice LABOR, MD;  Location: [facility];  Service: Cardiovascular;  Laterality: N/A;   OSTEOTOMY Bilateral 1980 and 1981   correction congenital bow legged   POLYPECTOMY N/A 02/16/2023   Procedure: POLYPECTOMY;  Surgeon: Jinny Carmine, MD;  Location: [facility];  Service: Endoscopy;  Laterality: N/A;   TONSILLECTOMY     TUBAL LIGATION     Patient Active Problem List   Diagnosis Date Noted   CAD (coronary atherosclerotic disease) 10/10/2023   Intermittent right-sided chest pain 09/24/2023   Tubular adenoma of colon 10/05/2022   Hypertriglyceridemia without hypercholesterolemia 09/22/2020   Melanoma of skin (HCC) 10/09/2019   History of 2019 novel  coronavirus disease (COVID-19) 04/19/2019   Colon cancer screening    Polyp of sigmoid colon    Rectal polyp    Hepatic steatosis 08/12/2016   S/P partial colectomy 04/11/2016   Left thyroid  nodule 04/30/2014   Cervical cancer screening 04/05/2013   GERD (gastroesophageal reflux disease) 04/24/2012   External hemorrhoid 04/24/2012   Encounter for preventive health examination 03/29/2012   History of tobacco abuse 12/21/2010   S/P tubal ligation 01/29/2000   S/P chole 11/30/1983    ONSET DATE:  date of referral 03/24/2024  REFERRING DIAG: R49.0 (ICD-10-CM) - Dysphonia   THERAPY DIAG:  Dysphonia  Rationale for Evaluation and Treatment Rehabilitation  SUBJECTIVE:   PERTINENT HISTORY: Pt is a 63 year old female with past medical history or smoking (stopped in 2019), GERD, coronary artery disease and hyperlipidemia. Pt states that she has had a fair number of intubations.   DIAGNOSTIC FINDINGS:  ENT evaluation dated 03/20/2024 Laryngoscopy revealed Patient has bowing of bilateral vocal cords on endoscopy today. She notes she will aspirate occasionally.   PAIN:  Are you having pain? No   FALLS: Has patient fallen in last 6 months? No,   LIVING ENVIRONMENT: Lives with: lives with their spouse Lives in: House/apartment  PLOF: Independent  PATIENT GOALS    to improve voice and reduce s/s of aspiration  SUBJECTIVE STATEMENT: Pt pleasant, great historian, states she started cough/throat clearing suppression techniques Pt accompanied by: self  OBJECTIVE:   TODAY'S TREATMENT:  Skilled treatment session targeted pt's dysphonia goals. SLP facilitated session by providing the following interventions:  Skilled education with pictures utilized to describe function of vocal cords. Copy provided to pt.    Pt with continual sensation to clear throat throughout session by able to suppress with use of pectin based throat lozenge, swallow and sips of water .   Pt able to  complete hierarchical voice strengthening drills through simple sentence level with moderate faded to supervision level support. Use of audio feedback helpful in promoting perception of increased vocal quality.    PATIENT EDUCATION: Education details: see above Person educated: Patient Education method: Chief Technology Officer Education comprehension: verbalized understanding and needs further education   HOME EXERCISE PROGRAM: Practice suppression techniques for throat clears Voice building drills (as above)     GOALS: Goals reviewed with patient? Yes  SHORT TERM GOALS: Target date: 10 sessions  The patient will maximize voice quality and loudness using breath support/oral resonance for sustained vowel production, pitch glides, and hierarchal speech drill.  Baseline: Goal status: INITIAL  2.  The patient will demonstrate abdominal breathing patterns and steady release of breath on exhalation to optimize efficiency of voicing and decrease laryngeal hyperfunction.  Baseline:  Goal status: INITIAL  3.  The patient will increase hydration for an eventual goal of 6-8 glasses per day and limit caffeine intake (to maximum of 1-2, 8 oz cups/day), as measured by  patient report.  Baseline:  Goal status: INITIAL  4.  Pt will discriminate healthy use from misuse of the voice in various situations with 80% accuracy across 3 data collections.  Baseline:  Goal status: INITIAL   LONG TERM GOALS: Target date: 05/27/2024  The patient will be independent for abdominal breathing and breath support exercises.  Baseline:  Goal status: INITIAL  2.  The patient will demonstrate independent understanding of vocal hygiene concepts.  Baseline:  Goal status: INITIAL  3.  Patient will report improved communication effectiveness as measured by PROM Baseline:  Goal status: INITIAL   ASSESSMENT:  CLINICAL IMPRESSION: Patient is a 63 y.o. 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 SABRA Pt presents with mild dysphonia that is c/b hoarse, breathy, mildly strained that results in reduced ability to project her voice. This is further compounded by presence of what appears to be a habitual throat clear.   Pt eager, responsive to voice strengthening activities. See the above treatment note for details.   OBJECTIVE IMPAIRMENTS include voice disorder. These impairments are limiting patient from effectively communicating at home and in community. Factors affecting potential to achieve goals and functional outcome are N/A. Patient will benefit from skilled SLP services to address above impairments and improve overall function.  REHAB POTENTIAL: Excellent  PLAN: SLP FREQUENCY: 1-2x/week  SLP DURATION: 8 weeks  PLANNED INTERVENTIONS: Aspiration precaution training, SLP instruction and feedback, Compensatory strategies, and Patient/family education   Jaasia Viglione B. Rubbie, M.S., CCC-SLP, Tree Surgeon Certified Brain Injury Specialist [facility]  [facility] Rehabilitation Services Office [phone] Ascom [phone] Fax [phone]

## 2024-04-07 ENCOUNTER — Ambulatory Visit: Admitting: Speech Pathology

## 2024-04-07 DIAGNOSIS — R490 Dysphonia: Secondary | ICD-10-CM

## 2024-04-07 NOTE — Therapy (Signed)
 OUTPATIENT SPEECH LANGUAGE PATHOLOGY  VOICE TREATMENT NOTE   Patient Name: Pamela Lara MRN: [id number] DOB:11/14/1960, 63 y.o., female Today's Date: 04/07/2024  PCP: Verneita Kettering, MD REFERRING PROVIDER: Almarie Roe, PA   End of Session - 04/07/24 [phone]     Visit Number 3    Number of Visits 17    Date for Recertification  05/27/24    Authorization Type [employer]    Progress Note Due on Visit 10    SLP Start Time 0800    SLP Stop Time  0845    SLP Time Calculation (min) 45 min    Activity Tolerance Patient tolerated treatment well          Past Medical History:  Diagnosis Date   Arthritis    right shoulder   BPPV (benign paroxysmal positional vertigo)    CAD (coronary artery disease)    a. 09/2023 Cor CTA: Sev prox/mid LAD dzs w/ FFRct 0.64; b. 09/2023 PCI: LM 99p/m (3.5x20 Synergy DES), 40m, D1 99 (jailed-->DCBA), RI nl, LCX nl, RCA nl, EF 55-65%.   Congenital bowed legs    s/p  surgical correction   Diverticulitis, colon    recurrent   Dysplastic nevus 09/09/2018   R mid post thigh   Dysplastic nevus 07/09/2019   R buttock,moderate atypia   Dysplastic nevus 07/09/2019   L buttock, moderate atypia   Dysplastic nevus 07/09/2019   R lat popliteal, moderate atypia   Ectopic pregnancy 01/29/2000   GERD (gastroesophageal reflux disease)    watch diet (as needed takes apple cider vinager)   History of colonic diverticulitis    hx multiple acute diverticulitis episodes treated medically   History of ectopic pregnancy    History of Helicobacter pylori infection 2013  approx.   Melanoma (HCC) 04/19/2018   R prox bicep (MMIS)   PONV (postoperative nausea and vomiting)    Seasonal affective disorder    Past Surgical History:  Procedure Laterality Date   CHOLECYSTECTOMY OPEN  1984   COLON SURGERY  April 2019   Sigmoid colon resection   COLONOSCOPY WITH PROPOFOL  N/A 04/19/2017   Procedure: COLONOSCOPY WITH PROPOFOL ;  Surgeon: Jinny Carmine, MD;   Location: [facility];  Service: Endoscopy;  Laterality: N/A;   COLONOSCOPY WITH PROPOFOL  N/A 02/16/2023   Procedure: COLONOSCOPY WITH PROPOFOL ;  Surgeon: Jinny Carmine, MD;  Location: [facility];  Service: Endoscopy;  Laterality: N/A;   CORONARY STENT INTERVENTION N/A 10/10/2023   Procedure: CORONARY STENT INTERVENTION;  Surgeon: Darron Deatrice LABOR, MD;  Location: [facility];  Service: Cardiovascular;  Laterality: N/A;  LAD and DIAG   CYSTOSCOPY WITH STENT PLACEMENT Bilateral 08/15/2017   Procedure: CYSTOSCOPY WITH  FOLEY PLACEMENT WITH BILATERAL open ended catheter placement.  catheters removed at the end of the case.;  Surgeon: Carolee Sherwood JONETTA DOUGLAS, MD;  Location: [facility];  Service: Urology;  Laterality: Bilateral;   EXCISIONAL SENTINAL LYMPH NODE BIOPSY, NECK N/A 2016  approx.   per pt thyroid  area , benign   LAPAROSCOPIC SIGMOID COLECTOMY N/A 08/15/2017   Procedure: LAPAROSCOPIC SIGMOID COLECTOMY  ERAS PATHWAY;  Surgeon: Teresa Lonni HERO, MD;  Location: [facility];  Service: General;  Laterality: N/A;   LAPAROSCOPY FOR ECTOPIC PREGNANCY  2001   also  BILATERAL TUBAL LIGATION   LEFT HEART CATH AND CORONARY ANGIOGRAPHY N/A 10/10/2023   Procedure: LEFT HEART CATH AND CORONARY ANGIOGRAPHY;  Surgeon: Darron Deatrice LABOR, MD;  Location: [facility];  Service: Cardiovascular;  Laterality: N/A;   OSTEOTOMY Bilateral 1980 and 1981   correction congenital bow legged   POLYPECTOMY N/A 02/16/2023   Procedure: POLYPECTOMY;  Surgeon: Jinny Carmine, MD;  Location: [facility];  Service: Endoscopy;  Laterality: N/A;   TONSILLECTOMY     TUBAL LIGATION     Patient Active Problem List   Diagnosis Date Noted   CAD (coronary atherosclerotic disease) 10/10/2023   Intermittent right-sided chest pain 09/24/2023   Tubular adenoma of colon 10/05/2022   Hypertriglyceridemia without hypercholesterolemia 09/22/2020   Melanoma of skin (HCC) 10/09/2019   History of 2019 novel  coronavirus disease (COVID-19) 04/19/2019   Colon cancer screening    Polyp of sigmoid colon    Rectal polyp    Hepatic steatosis 08/12/2016   S/P partial colectomy 04/11/2016   Left thyroid  nodule 04/30/2014   Cervical cancer screening 04/05/2013   GERD (gastroesophageal reflux disease) 04/24/2012   External hemorrhoid 04/24/2012   Encounter for preventive health examination 03/29/2012   History of tobacco abuse 12/21/2010   S/P tubal ligation 01/29/2000   S/P chole 11/30/1983    ONSET DATE:  date of referral 03/24/2024  REFERRING DIAG: R49.0 (ICD-10-CM) - Dysphonia   THERAPY DIAG:  Dysphonia  Rationale for Evaluation and Treatment Rehabilitation  SUBJECTIVE:   PERTINENT HISTORY: Pt is a 63 year old female with past medical history or smoking (stopped in 2019), GERD, coronary artery disease and hyperlipidemia. Pt states that she has had a fair number of intubations.   DIAGNOSTIC FINDINGS:  ENT evaluation dated 03/20/2024 Laryngoscopy revealed Patient has bowing of bilateral vocal cords on endoscopy today. She notes she will aspirate occasionally.   PAIN:  Are you having pain? No   FALLS: Has patient fallen in last 6 months? No,   LIVING ENVIRONMENT: Lives with: lives with their spouse Lives in: House/apartment  PLOF: Independent  PATIENT GOALS    to improve voice and reduce s/s of aspiration  SUBJECTIVE STATEMENT: Pt pleasant, great historian, states she started cough/throat clearing suppression techniques Pt accompanied by: self  OBJECTIVE:   TODAY'S TREATMENT:  Skilled treatment session targeted pt's dysphonia goals. SLP facilitated session by providing the following interventions:  Pt reports increased phlegm productive especially in the mornings - recommend using steam to help with phlegm - might be related to recent new cardiac medicine    Pt with continual sensation to clear throat throughout session by able to suppress with use of pectin based  throat lozenge, swallow and sips of water .   Pt able to complete hierarchical voice strengthening drills through simple sentence level with supervision level support. Much improved - progressed to reading list of functional phrases with independent use of abdominal support as well as increased moderate vocal intensity   PATIENT EDUCATION: Education details: see above Person educated: Patient Education method: Chief Technology Officer Education comprehension: verbalized understanding and needs further education   HOME EXERCISE PROGRAM: Practice suppression techniques for throat clears Voice building drills (as above)     GOALS: Goals reviewed with patient? Yes  SHORT TERM GOALS: Target date: 10 sessions  The patient will maximize voice quality and loudness using breath support/oral resonance for sustained vowel production, pitch glides, and hierarchal speech drill.  Baseline: Goal status: INITIAL  2.  The patient will demonstrate abdominal breathing patterns and steady release of breath on exhalation to optimize efficiency of voicing and decrease laryngeal hyperfunction.  Baseline:  Goal status: INITIAL  3.  The patient will increase hydration for an eventual goal of  6-8 glasses per day and limit caffeine intake (to maximum of 1-2, 8 oz cups/day), as measured by patient report.  Baseline:  Goal status: INITIAL  4.  Pt will discriminate healthy use from misuse of the voice in various situations with 80% accuracy across 3 data collections.  Baseline:  Goal status: INITIAL   LONG TERM GOALS: Target date: 05/27/2024  The patient will be independent for abdominal breathing and breath support exercises.  Baseline:  Goal status: INITIAL  2.  The patient will demonstrate independent understanding of vocal hygiene concepts.  Baseline:  Goal status: INITIAL  3.  Patient will report improved communication effectiveness as measured by PROM Baseline:  Goal status:  INITIAL   ASSESSMENT:  CLINICAL IMPRESSION: Patient is a 63 y.o. 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 Pamela Lara Pt presents with mild dysphonia that is c/b hoarse, breathy, mildly strained that results in reduced ability to project her voice. This is further compounded by presence of what appears to be a habitual throat clear.   Pt eager, responsive to voice strengthening activities. See the above treatment note for details.   OBJECTIVE IMPAIRMENTS include voice disorder. These impairments are limiting patient from effectively communicating at home and in community. Factors affecting potential to achieve goals and functional outcome are N/A. Patient will benefit from skilled SLP services to address above impairments and improve overall function.  REHAB POTENTIAL: Excellent  PLAN: SLP FREQUENCY: 1-2x/week  SLP DURATION: 8 weeks  PLANNED INTERVENTIONS: Aspiration precaution training, SLP instruction and feedback, Compensatory strategies, and Patient/family education   Yukiko Minnich B. Rubbie, M.S., CCC-SLP, Tree Surgeon Certified Brain Injury Specialist [facility]  [facility] Rehabilitation Services Office [phone] Ascom [phone] Fax [phone]

## 2024-04-09 ENCOUNTER — Ambulatory Visit: Admitting: Speech Pathology

## 2024-04-09 DIAGNOSIS — R490 Dysphonia: Secondary | ICD-10-CM

## 2024-04-09 NOTE — Therapy (Signed)
 OUTPATIENT SPEECH LANGUAGE PATHOLOGY  VOICE TREATMENT NOTE   Patient Name: Pamela Lara MRN: [id number] DOB:07-16-1960, 63 y.o., female Today's Date: 04/09/2024  PCP: Verneita Kettering, MD REFERRING PROVIDER: Almarie Roe, PA   End of Session - 04/09/24 0810     Visit Number 4    Number of Visits 17    Date for Recertification  05/27/24    Authorization Type [employer]    Progress Note Due on Visit 10    SLP Start Time 0800    SLP Stop Time  0845    SLP Time Calculation (min) 45 min    Activity Tolerance Patient tolerated treatment well          Past Medical History:  Diagnosis Date   Arthritis    right shoulder   BPPV (benign paroxysmal positional vertigo)    CAD (coronary artery disease)    a. 09/2023 Cor CTA: Sev prox/mid LAD dzs w/ FFRct 0.64; b. 09/2023 PCI: LM 99p/m (3.5x20 Synergy DES), 57m, D1 99 (jailed-->DCBA), RI nl, LCX nl, RCA nl, EF 55-65%.   Congenital bowed legs    s/p  surgical correction   Diverticulitis, colon    recurrent   Dysplastic nevus 09/09/2018   R mid post thigh   Dysplastic nevus 07/09/2019   R buttock,moderate atypia   Dysplastic nevus 07/09/2019   L buttock, moderate atypia   Dysplastic nevus 07/09/2019   R lat popliteal, moderate atypia   Ectopic pregnancy 01/29/2000   GERD (gastroesophageal reflux disease)    watch diet (as needed takes apple cider vinager)   History of colonic diverticulitis    hx multiple acute diverticulitis episodes treated medically   History of ectopic pregnancy    History of Helicobacter pylori infection 2013  approx.   Melanoma (HCC) 04/19/2018   R prox bicep (MMIS)   PONV (postoperative nausea and vomiting)    Seasonal affective disorder    Past Surgical History:  Procedure Laterality Date   CHOLECYSTECTOMY OPEN  1984   COLON SURGERY  April 2019   Sigmoid colon resection   COLONOSCOPY WITH PROPOFOL  N/A 04/19/2017   Procedure: COLONOSCOPY WITH PROPOFOL ;  Surgeon: Jinny Carmine,  MD;  Location: [facility];  Service: Endoscopy;  Laterality: N/A;   COLONOSCOPY WITH PROPOFOL  N/A 02/16/2023   Procedure: COLONOSCOPY WITH PROPOFOL ;  Surgeon: Jinny Carmine, MD;  Location: [facility];  Service: Endoscopy;  Laterality: N/A;   CORONARY STENT INTERVENTION N/A 10/10/2023   Procedure: CORONARY STENT INTERVENTION;  Surgeon: Darron Deatrice LABOR, MD;  Location: [facility];  Service: Cardiovascular;  Laterality: N/A;  LAD and DIAG   CYSTOSCOPY WITH STENT PLACEMENT Bilateral 08/15/2017   Procedure: CYSTOSCOPY WITH  FOLEY PLACEMENT WITH BILATERAL open ended catheter placement.  catheters removed at the end of the case.;  Surgeon: Carolee Sherwood JONETTA DOUGLAS, MD;  Location: [facility];  Service: Urology;  Laterality: Bilateral;   EXCISIONAL SENTINAL LYMPH NODE BIOPSY, NECK N/A 2016  approx.   per pt thyroid  area , benign   LAPAROSCOPIC SIGMOID COLECTOMY N/A 08/15/2017   Procedure: LAPAROSCOPIC SIGMOID COLECTOMY  ERAS PATHWAY;  Surgeon: Teresa Lonni HERO, MD;  Location: [facility];  Service: General;  Laterality: N/A;   LAPAROSCOPY FOR ECTOPIC PREGNANCY  2001   also  BILATERAL TUBAL LIGATION   LEFT HEART CATH AND CORONARY ANGIOGRAPHY N/A 10/10/2023   Procedure: LEFT HEART CATH AND CORONARY ANGIOGRAPHY;  Surgeon: Darron Deatrice LABOR, MD;  Location: [facility];  Service: Cardiovascular;  Laterality: N/A;   OSTEOTOMY Bilateral 1980 and 1981   correction congenital bow legged   POLYPECTOMY N/A 02/16/2023   Procedure: POLYPECTOMY;  Surgeon: Jinny Carmine, MD;  Location: [facility];  Service: Endoscopy;  Laterality: N/A;   TONSILLECTOMY     TUBAL LIGATION     Patient Active Problem List   Diagnosis Date Noted   CAD (coronary atherosclerotic disease) 10/10/2023   Intermittent right-sided chest pain 09/24/2023   Tubular adenoma of colon 10/05/2022   Hypertriglyceridemia without hypercholesterolemia 09/22/2020   Melanoma of skin (HCC) 10/09/2019   History of 2019 novel  coronavirus disease (COVID-19) 04/19/2019   Colon cancer screening    Polyp of sigmoid colon    Rectal polyp    Hepatic steatosis 08/12/2016   S/P partial colectomy 04/11/2016   Left thyroid  nodule 04/30/2014   Cervical cancer screening 04/05/2013   GERD (gastroesophageal reflux disease) 04/24/2012   External hemorrhoid 04/24/2012   Encounter for preventive health examination 03/29/2012   History of tobacco abuse 12/21/2010   S/P tubal ligation 01/29/2000   S/P chole 11/30/1983    ONSET DATE:  date of referral 03/24/2024  REFERRING DIAG: R49.0 (ICD-10-CM) - Dysphonia   THERAPY DIAG:  Dysphonia  Rationale for Evaluation and Treatment Rehabilitation  SUBJECTIVE:   PERTINENT HISTORY: Pt is a 63 year old female with past medical history or smoking (stopped in 2019), GERD, coronary artery disease and hyperlipidemia. Pt states that she has had a fair number of intubations.   DIAGNOSTIC FINDINGS:  ENT evaluation dated 03/20/2024 Laryngoscopy revealed Patient has bowing of bilateral vocal cords on endoscopy today. She notes she will aspirate occasionally.   PAIN:  Are you having pain? No   FALLS: Has patient fallen in last 6 months? No,   LIVING ENVIRONMENT: Lives with: lives with their spouse Lives in: House/apartment  PLOF: Independent  PATIENT GOALS    to improve voice and reduce s/s of aspiration  SUBJECTIVE STATEMENT: Pt pleasant, great historian, states she started cough/throat clearing suppression techniques Pt accompanied by: self  OBJECTIVE:   TODAY'S TREATMENT:  Skilled treatment session targeted pt's dysphonia goals. SLP facilitated session by providing the following interventions:  While pt stated she continued to struggle with phlegm, this didn't appear as frequent as prior session. Pt continues to report globus sensation (there is something there) but even with a cued cough/throat clear during the session, nothing came up   Pt able to complete  hierarchical voice strengthening drills through simple sentence level and functional phrases independently. List of longer sentences (6-7 words) related to pt's job were created. Pt able to read with much improve breath support and increase in moderate loudness (as well as keeping great prosody). Recommend pt continue to practice to improve vocal strength and motor response of improved respiratory support during phonation.    PATIENT EDUCATION: Education details: see above Person educated: Patient Education method: Chief Technology Officer Education comprehension: verbalized understanding and needs further education   HOME EXERCISE PROGRAM: Practice suppression techniques for throat clears Voice building drills (as above)     GOALS: Goals reviewed with patient? Yes  SHORT TERM GOALS: Target date: 10 sessions  The patient will maximize voice quality and loudness using breath support/oral resonance for sustained vowel production, pitch glides, and hierarchal speech drill.  Baseline: Goal status: INITIAL  2.  The patient will demonstrate abdominal breathing patterns and steady release of breath on exhalation to optimize efficiency of voicing and decrease laryngeal hyperfunction.  Baseline:  Goal status:  INITIAL  3.  The patient will increase hydration for an eventual goal of 6-8 glasses per day and limit caffeine intake (to maximum of 1-2, 8 oz cups/day), as measured by patient report.  Baseline:  Goal status: INITIAL  4.  Pt will discriminate healthy use from misuse of the voice in various situations with 80% accuracy across 3 data collections.  Baseline:  Goal status: INITIAL   LONG TERM GOALS: Target date: 05/27/2024  The patient will be independent for abdominal breathing and breath support exercises.  Baseline:  Goal status: INITIAL  2.  The patient will demonstrate independent understanding of vocal hygiene concepts.  Baseline:  Goal status: INITIAL  3.  Patient  will report improved communication effectiveness as measured by PROM Baseline:  Goal status: INITIAL   ASSESSMENT:  CLINICAL IMPRESSION: Patient is a 63 y.o. 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 SABRA Pt presents with mild dysphonia that is c/b hoarse, breathy, mildly strained that results in reduced ability to project her voice. This is further compounded by presence of what appears to be a habitual throat clear.   Pt eager, responsive to voice strengthening activities. See the above treatment note for details.   OBJECTIVE IMPAIRMENTS include voice disorder. These impairments are limiting patient from effectively communicating at home and in community. Factors affecting potential to achieve goals and functional outcome are N/A. Patient will benefit from skilled SLP services to address above impairments and improve overall function.  REHAB POTENTIAL: Excellent  PLAN: SLP FREQUENCY: 1-2x/week  SLP DURATION: 8 weeks  PLANNED INTERVENTIONS: Aspiration precaution training, SLP instruction and feedback, Compensatory strategies, and Patient/family education   Kamoni Depree B. Rubbie, M.S., CCC-SLP, Tree Surgeon Certified Brain Injury Specialist [facility]  [facility] Rehabilitation Services Office [phone] Ascom [phone] Fax [phone]

## 2024-04-14 ENCOUNTER — Ambulatory Visit: Admitting: Speech Pathology

## 2024-04-14 DIAGNOSIS — R490 Dysphonia: Secondary | ICD-10-CM | POA: Diagnosis not present

## 2024-04-14 NOTE — Therapy (Signed)
 OUTPATIENT SPEECH LANGUAGE PATHOLOGY  VOICE TREATMENT NOTE   Patient Name: Pamela Lara MRN: [id number] DOB:[birth year], 63 y.o., female Today's Date: 04/14/2024  PCP: Verneita Kettering, MD REFERRING PROVIDER: Almarie Roe, PA   End of Session - 04/14/24 1006     Visit Number 5    Number of Visits 17    Date for Recertification  05/27/24    Authorization Type [employer]    Progress Note Due on Visit 10    SLP Start Time 0800    SLP Stop Time  0840    SLP Time Calculation (min) 40 min    Activity Tolerance Patient tolerated treatment well          Past Medical History:  Diagnosis Date   Arthritis    right shoulder   BPPV (benign paroxysmal positional vertigo)    CAD (coronary artery disease)    a. 09/2023 Cor CTA: Sev prox/mid LAD dzs w/ FFRct 0.64; b. 09/2023 PCI: LM 99p/m (3.5x20 Synergy DES), 39m, D1 99 (jailed-->DCBA), RI nl, LCX nl, RCA nl, EF 55-65%.   Congenital bowed legs    s/p  surgical correction   Diverticulitis, colon    recurrent   Dysplastic nevus 09/09/2018   R mid post thigh   Dysplastic nevus 07/09/2019   R buttock,moderate atypia   Dysplastic nevus 07/09/2019   L buttock, moderate atypia   Dysplastic nevus 07/09/2019   R lat popliteal, moderate atypia   Ectopic pregnancy 01/29/2000   GERD (gastroesophageal reflux disease)    watch diet (as needed takes apple cider vinager)   History of colonic diverticulitis    hx multiple acute diverticulitis episodes treated medically   History of ectopic pregnancy    History of Helicobacter pylori infection 2013  approx.   Melanoma (HCC) 04/19/2018   R prox bicep (MMIS)   PONV (postoperative nausea and vomiting)    Seasonal affective disorder    Past Surgical History:  Procedure Laterality Date   CHOLECYSTECTOMY OPEN  1984   COLON SURGERY  April 2019   Sigmoid colon resection   COLONOSCOPY WITH PROPOFOL  N/A 04/19/2017   Procedure: COLONOSCOPY WITH PROPOFOL ;  Surgeon: Jinny Carmine,  MD;  Location: [facility];  Service: Endoscopy;  Laterality: N/A;   COLONOSCOPY WITH PROPOFOL  N/A 02/16/2023   Procedure: COLONOSCOPY WITH PROPOFOL ;  Surgeon: Jinny Carmine, MD;  Location: [facility];  Service: Endoscopy;  Laterality: N/A;   CORONARY STENT INTERVENTION N/A 10/10/2023   Procedure: CORONARY STENT INTERVENTION;  Surgeon: Darron Deatrice LABOR, MD;  Location: [facility];  Service: Cardiovascular;  Laterality: N/A;  LAD and DIAG   CYSTOSCOPY WITH STENT PLACEMENT Bilateral 08/15/2017   Procedure: CYSTOSCOPY WITH  FOLEY PLACEMENT WITH BILATERAL open ended catheter placement.  catheters removed at the end of the case.;  Surgeon: Carolee Sherwood JONETTA DOUGLAS, MD;  Location: [facility];  Service: Urology;  Laterality: Bilateral;   EXCISIONAL SENTINAL LYMPH NODE BIOPSY, NECK N/A 2016  approx.   per pt thyroid  area , benign   LAPAROSCOPIC SIGMOID COLECTOMY N/A 08/15/2017   Procedure: LAPAROSCOPIC SIGMOID COLECTOMY  ERAS PATHWAY;  Surgeon: Teresa Lonni HERO, MD;  Location: [facility];  Service: General;  Laterality: N/A;   LAPAROSCOPY FOR ECTOPIC PREGNANCY  2001   also  BILATERAL TUBAL LIGATION   LEFT HEART CATH AND CORONARY ANGIOGRAPHY N/A 10/10/2023   Procedure: LEFT HEART CATH AND CORONARY ANGIOGRAPHY;  Surgeon: Darron Deatrice LABOR, MD;  Location: [facility];  Service: Cardiovascular;  Laterality: N/A;   OSTEOTOMY Bilateral 1980 and 1981   correction congenital bow legged   POLYPECTOMY N/A 02/16/2023   Procedure: POLYPECTOMY;  Surgeon: Jinny Carmine, MD;  Location: [facility];  Service: Endoscopy;  Laterality: N/A;   TONSILLECTOMY     TUBAL LIGATION     Patient Active Problem List   Diagnosis Date Noted   CAD (coronary atherosclerotic disease) 10/10/2023   Intermittent right-sided chest pain 09/24/2023   Tubular adenoma of colon 10/05/2022   Hypertriglyceridemia without hypercholesterolemia 09/22/2020   Melanoma of skin (HCC) 10/09/2019   History of 2019 novel  coronavirus disease (COVID-19) 04/19/2019   Colon cancer screening    Polyp of sigmoid colon    Rectal polyp    Hepatic steatosis 08/12/2016   S/P partial colectomy 04/11/2016   Left thyroid  nodule 04/30/2014   Cervical cancer screening 04/05/2013   GERD (gastroesophageal reflux disease) 04/24/2012   External hemorrhoid 04/24/2012   Encounter for preventive health examination 03/29/2012   History of tobacco abuse 12/21/2010   S/P tubal ligation 01/29/2000   S/P chole 11/30/1983    ONSET DATE:  date of referral 03/24/2024  REFERRING DIAG: R49.0 (ICD-10-CM) - Dysphonia   THERAPY DIAG:  Dysphonia  Rationale for Evaluation and Treatment Rehabilitation  SUBJECTIVE:   PERTINENT HISTORY: Pt is a 63 year old female with past medical history or smoking (stopped in 2019), GERD, coronary artery disease and hyperlipidemia. Pt states that she has had a fair number of intubations.   DIAGNOSTIC FINDINGS:  ENT evaluation dated 03/20/2024 Laryngoscopy revealed Patient has bowing of bilateral vocal cords on endoscopy today. She notes she will aspirate occasionally.   PAIN:  Are you having pain? No   FALLS: Has patient fallen in last 6 months? No,   LIVING ENVIRONMENT: Lives with: lives with their spouse Lives in: House/apartment  PLOF: Independent  PATIENT GOALS    to improve voice and reduce s/s of aspiration  SUBJECTIVE STATEMENT: Pt pleasant, great historian, reduced desire to clear throat observed today Pt accompanied by: self  OBJECTIVE:   TODAY'S TREATMENT:  Skilled treatment session targeted pt's dysphonia goals. SLP facilitated session by providing the following interventions:  Reduced desire to clear throat per pt report    Skilled verbal and written instruction provided on vocal warm-ups with pt able to return demonstration. Additional longer sentences added to pt's list of sentences related to work. With increased practice pt able to pace her breath support for  stronger production.    PATIENT EDUCATION: Education details: see above Person educated: Patient Education method: Chief Technology Officer Education comprehension: verbalized understanding and needs further education   HOME EXERCISE PROGRAM: Practice suppression techniques for throat clears Voice building drills (as above)     GOALS: Goals reviewed with patient? Yes  SHORT TERM GOALS: Target date: 10 sessions  The patient will maximize voice quality and loudness using breath support/oral resonance for sustained vowel production, pitch glides, and hierarchal speech drill.  Baseline: Goal status: INITIAL  2.  The patient will demonstrate abdominal breathing patterns and steady release of breath on exhalation to optimize efficiency of voicing and decrease laryngeal hyperfunction.  Baseline:  Goal status: INITIAL  3.  The patient will increase hydration for an eventual goal of 6-8 glasses per day and limit caffeine intake (to maximum of 1-2, 8 oz cups/day), as measured by patient report.  Baseline:  Goal status: INITIAL  4.  Pt will discriminate healthy use from misuse of the voice in various situations with  80% accuracy across 3 data collections.  Baseline:  Goal status: INITIAL   LONG TERM GOALS: Target date: 05/27/2024  The patient will be independent for abdominal breathing and breath support exercises.  Baseline:  Goal status: INITIAL  2.  The patient will demonstrate independent understanding of vocal hygiene concepts.  Baseline:  Goal status: INITIAL  3.  Patient will report improved communication effectiveness as measured by PROM Baseline:  Goal status: INITIAL   ASSESSMENT:  CLINICAL IMPRESSION: Patient is a 63 y.o. 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 Pamela Lara Pt presents with mild dysphonia that is c/b hoarse, breathy, mildly strained that results in reduced ability to project her  voice. This is further compounded by presence of what appears to be a habitual throat clear.   Pt eager, responsive to voice strengthening activities. See the above treatment note for details.   OBJECTIVE IMPAIRMENTS include voice disorder. These impairments are limiting patient from effectively communicating at home and in community. Factors affecting potential to achieve goals and functional outcome are N/A. Patient will benefit from skilled SLP services to address above impairments and improve overall function.  REHAB POTENTIAL: Excellent  PLAN: SLP FREQUENCY: 1-2x/week  SLP DURATION: 8 weeks  PLANNED INTERVENTIONS: Aspiration precaution training, SLP instruction and feedback, Compensatory strategies, and Patient/family education   Pamela Lara, M.S., CCC-SLP, Tree Surgeon Certified Brain Injury Specialist [facility]  [facility] Rehabilitation Services Office [phone] Ascom [phone] Fax [phone]

## 2024-04-16 ENCOUNTER — Ambulatory Visit: Admitting: Speech Pathology

## 2024-04-17 ENCOUNTER — Ambulatory Visit: Payer: Self-pay | Admitting: Cardiovascular Disease

## 2024-04-17 ENCOUNTER — Ambulatory Visit: Admitting: Speech Pathology

## 2024-04-17 ENCOUNTER — Other Ambulatory Visit
Admission: RE | Admit: 2024-04-17 | Discharge: 2024-04-17 | Disposition: A | Discharge status: Home / Self Care | Source: Home / Self Care | Attending: Cardiovascular Disease | Admitting: Cardiovascular Disease

## 2024-04-17 DIAGNOSIS — R490 Dysphonia: Secondary | ICD-10-CM

## 2024-04-17 DIAGNOSIS — E785 Hyperlipidemia, unspecified: Secondary | ICD-10-CM | POA: Insufficient documentation

## 2024-04-17 LAB — HEPATIC FUNCTION PANEL
ALT: 38 U/L (ref 0–44)
AST: 26 U/L (ref 15–41)
Albumin: 4.5 g/dL (ref 3.5–5.0)
Alkaline Phosphatase: 60 U/L (ref 38–126)
Bilirubin, Direct: 0.3 mg/dL — ABNORMAL HIGH (ref 0.0–0.2)
Indirect Bilirubin: 0.4 mg/dL (ref 0.3–0.9)
Total Bilirubin: 0.7 mg/dL (ref 0.0–1.2)
Total Protein: 7.1 g/dL (ref 6.5–8.1)

## 2024-04-17 LAB — LIPID PANEL
Cholesterol: 121 mg/dL (ref 0–200)
HDL: 57 mg/dL (ref >40)
LDL Cholesterol: 39 mg/dL (ref 0–99)
Total CHOL/HDL Ratio: 2.1 ratio
Triglycerides: 123 mg/dL (ref <150)
VLDL: 25 mg/dL (ref 0–40)

## 2024-04-17 NOTE — Therapy (Signed)
 OUTPATIENT SPEECH LANGUAGE PATHOLOGY  VOICE TREATMENT NOTE   Patient Name: Pamela Lara MRN: [id number] DOB:[birth year], 63 y.o., female Today's Date: 04/17/2024  PCP: Verneita Kettering, MD REFERRING PROVIDER: Almarie Roe, PA   End of Session - 04/17/24 [phone]     Visit Number 6    Number of Visits 17    Date for Recertification  05/27/24    Authorization Type [employer]    Progress Note Due on Visit 10    SLP Start Time 0800    SLP Stop Time  0825    SLP Time Calculation (min) 25 min    Activity Tolerance Patient tolerated treatment well          Past Medical History:  Diagnosis Date   Arthritis    right shoulder   BPPV (benign paroxysmal positional vertigo)    CAD (coronary artery disease)    a. 09/2023 Cor CTA: Sev prox/mid LAD dzs w/ FFRct 0.64; b. 09/2023 PCI: LM 99p/m (3.5x20 Synergy DES), 26m, D1 99 (jailed-->DCBA), RI nl, LCX nl, RCA nl, EF 55-65%.   Congenital bowed legs    s/p  surgical correction   Diverticulitis, colon    recurrent   Dysplastic nevus 09/09/2018   R mid post thigh   Dysplastic nevus 07/09/2019   R buttock,moderate atypia   Dysplastic nevus 07/09/2019   L buttock, moderate atypia   Dysplastic nevus 07/09/2019   R lat popliteal, moderate atypia   Ectopic pregnancy 01/29/2000   GERD (gastroesophageal reflux disease)    watch diet (as needed takes apple cider vinager)   History of colonic diverticulitis    hx multiple acute diverticulitis episodes treated medically   History of ectopic pregnancy    History of Helicobacter pylori infection 2013  approx.   Melanoma (HCC) 04/19/2018   R prox bicep (MMIS)   PONV (postoperative nausea and vomiting)    Seasonal affective disorder    Past Surgical History:  Procedure Laterality Date   CHOLECYSTECTOMY OPEN  1984   COLON SURGERY  April 2019   Sigmoid colon resection   COLONOSCOPY WITH PROPOFOL  N/A 04/19/2017   Procedure: COLONOSCOPY WITH PROPOFOL ;  Surgeon: Jinny Carmine,  MD;  Location: [facility];  Service: Endoscopy;  Laterality: N/A;   COLONOSCOPY WITH PROPOFOL  N/A 02/16/2023   Procedure: COLONOSCOPY WITH PROPOFOL ;  Surgeon: Jinny Carmine, MD;  Location: [facility];  Service: Endoscopy;  Laterality: N/A;   CORONARY STENT INTERVENTION N/A 10/10/2023   Procedure: CORONARY STENT INTERVENTION;  Surgeon: Darron Deatrice LABOR, MD;  Location: [facility];  Service: Cardiovascular;  Laterality: N/A;  LAD and DIAG   CYSTOSCOPY WITH STENT PLACEMENT Bilateral 08/15/2017   Procedure: CYSTOSCOPY WITH  FOLEY PLACEMENT WITH BILATERAL open ended catheter placement.  catheters removed at the end of the case.;  Surgeon: Carolee Sherwood JONETTA DOUGLAS, MD;  Location: [facility];  Service: Urology;  Laterality: Bilateral;   EXCISIONAL SENTINAL LYMPH NODE BIOPSY, NECK N/A 2016  approx.   per pt thyroid  area , benign   LAPAROSCOPIC SIGMOID COLECTOMY N/A 08/15/2017   Procedure: LAPAROSCOPIC SIGMOID COLECTOMY  ERAS PATHWAY;  Surgeon: Teresa Lonni HERO, MD;  Location: [facility];  Service: General;  Laterality: N/A;   LAPAROSCOPY FOR ECTOPIC PREGNANCY  2001   also  BILATERAL TUBAL LIGATION   LEFT HEART CATH AND CORONARY ANGIOGRAPHY N/A 10/10/2023   Procedure: LEFT HEART CATH AND CORONARY ANGIOGRAPHY;  Surgeon: Darron Deatrice LABOR, MD;  Location: [facility];  Service: Cardiovascular;  Laterality: N/A;   OSTEOTOMY Bilateral 1980 and 1981   correction congenital bow legged   POLYPECTOMY N/A 02/16/2023   Procedure: POLYPECTOMY;  Surgeon: Jinny Carmine, MD;  Location: [facility];  Service: Endoscopy;  Laterality: N/A;   TONSILLECTOMY     TUBAL LIGATION     Patient Active Problem List   Diagnosis Date Noted   CAD (coronary atherosclerotic disease) 10/10/2023   Intermittent right-sided chest pain 09/24/2023   Tubular adenoma of colon 10/05/2022   Hypertriglyceridemia without hypercholesterolemia 09/22/2020   Melanoma of skin (HCC) 10/09/2019   History of 2019 novel  coronavirus disease (COVID-19) 04/19/2019   Colon cancer screening    Polyp of sigmoid colon    Rectal polyp    Hepatic steatosis 08/12/2016   S/P partial colectomy 04/11/2016   Left thyroid  nodule 04/30/2014   Cervical cancer screening 04/05/2013   GERD (gastroesophageal reflux disease) 04/24/2012   External hemorrhoid 04/24/2012   Encounter for preventive health examination 03/29/2012   History of tobacco abuse 12/21/2010   S/P tubal ligation 01/29/2000   S/P chole 11/30/1983    ONSET DATE:  date of referral 03/24/2024  REFERRING DIAG: R49.0 (ICD-10-CM) - Dysphonia   THERAPY DIAG:  Dysphonia  Rationale for Evaluation and Treatment Rehabilitation  SUBJECTIVE:   PERTINENT HISTORY: Pt is a 63 year old female with past medical history or smoking (stopped in 2019), GERD, coronary artery disease and hyperlipidemia. Pt states that she has had a fair number of intubations.   DIAGNOSTIC FINDINGS:  ENT evaluation dated 03/20/2024 Laryngoscopy revealed Patient has bowing of bilateral vocal cords on endoscopy today. She notes she will aspirate occasionally.   PAIN:  Are you having pain? No   FALLS: Has patient fallen in last 6 months? No,   LIVING ENVIRONMENT: Lives with: lives with their spouse Lives in: House/apartment  PLOF: Independent  PATIENT GOALS    to improve voice and reduce s/s of aspiration  SUBJECTIVE STATEMENT: Pt pleasant, great historian, reduced desire to clear throat observed today Pt accompanied by: self  OBJECTIVE:   TODAY'S TREATMENT:  Skilled treatment session targeted pt's dysphonia goals. SLP facilitated session by providing the following interventions:  Reduced desire to clear throat per pt report - pt also reports advocating for herself to ask staff to reduce interrupting her. Additional ways to advocate for pt feedback on vocal quality were created.    Pt also reports that her daughter mentioned her improved speech. When reading complex  sentences, pt with improve breath support and vocla projection. She commented I am trying to project Excellent improvement.   PATIENT EDUCATION: Education details: see above Person educated: Patient Education method: Chief Technology Officer Education comprehension: verbalized understanding and needs further education   HOME EXERCISE PROGRAM: Practice suppression techniques for throat clears Voice building drills (as above)     GOALS: Goals reviewed with patient? Yes  SHORT TERM GOALS: Target date: 10 sessions  The patient will maximize voice quality and loudness using breath support/oral resonance for sustained vowel production, pitch glides, and hierarchal speech drill.  Baseline: Goal status: INITIAL  2.  The patient will demonstrate abdominal breathing patterns and steady release of breath on exhalation to optimize efficiency of voicing and decrease laryngeal hyperfunction.  Baseline:  Goal status: INITIAL  3.  The patient will increase hydration for an eventual goal of 6-8 glasses per day and limit caffeine intake (to maximum of 1-2, 8 oz cups/day), as measured by patient report.  Baseline:  Goal status: INITIAL  4.  Pt will discriminate healthy use from misuse of the voice in various situations with 80% accuracy across 3 data collections.  Baseline:  Goal status: INITIAL   LONG TERM GOALS: Target date: 05/27/2024  The patient will be independent for abdominal breathing and breath support exercises.  Baseline:  Goal status: INITIAL  2.  The patient will demonstrate independent understanding of vocal hygiene concepts.  Baseline:  Goal status: INITIAL  3.  Patient will report improved communication effectiveness as measured by PROM Baseline:  Goal status: INITIAL   ASSESSMENT:  CLINICAL IMPRESSION: Patient is a 63 y.o. 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 SABRA Pt presents with mild  dysphonia that is c/b hoarse, breathy, mildly strained that results in reduced ability to project her voice. This is further compounded by presence of what appears to be a habitual throat clear.   Pt eager, responsive to voice strengthening activities. See the above treatment note for details. She also mentioned an upcoming follow up appt with ENT and recheck for her middle ear.   OBJECTIVE IMPAIRMENTS include voice disorder. These impairments are limiting patient from effectively communicating at home and in community. Factors affecting potential to achieve goals and functional outcome are N/A. Patient will benefit from skilled SLP services to address above impairments and improve overall function.  REHAB POTENTIAL: Excellent  PLAN: SLP FREQUENCY: 1-2x/week  SLP DURATION: 8 weeks  PLANNED INTERVENTIONS: Aspiration precaution training, SLP instruction and feedback, Compensatory strategies, and Patient/family education   Shamyia Grandpre B. Rubbie, M.S., CCC-SLP, Tree Surgeon Certified Brain Injury Specialist [facility]  [facility] Rehabilitation Services Office [phone] Ascom [phone] Fax [phone]

## 2024-04-21 ENCOUNTER — Ambulatory Visit: Admitting: Speech Pathology

## 2024-04-21 DIAGNOSIS — R490 Dysphonia: Secondary | ICD-10-CM

## 2024-04-21 NOTE — Therapy (Signed)
 " OUTPATIENT SPEECH LANGUAGE PATHOLOGY  VOICE TREATMENT NOTE   Patient Name: Pamela Lara MRN: [id number] DOB:1960/08/26, 63 y.o., female Today's Date: 04/21/2024  PCP: Verneita Kettering, MD REFERRING PROVIDER: Almarie Roe, PA   End of Session - 04/21/24 1236     Visit Number 7    Number of Visits 17    Date for Recertification  05/27/24    Authorization Type [employer]    Progress Note Due on Visit 10    SLP Start Time 0800    SLP Stop Time  0845    SLP Time Calculation (min) 45 min    Activity Tolerance Patient tolerated treatment well          Past Medical History:  Diagnosis Date   Arthritis    right shoulder   BPPV (benign paroxysmal positional vertigo)    CAD (coronary artery disease)    a. 09/2023 Cor CTA: Sev prox/mid LAD dzs w/ FFRct 0.64; b. 09/2023 PCI: LM 99p/m (3.5x20 Synergy DES), 12m, D1 99 (jailed-->DCBA), RI nl, LCX nl, RCA nl, EF 55-65%.   Congenital bowed legs    s/p  surgical correction   Diverticulitis, colon    recurrent   Dysplastic nevus 09/09/2018   R mid post thigh   Dysplastic nevus 07/09/2019   R buttock,moderate atypia   Dysplastic nevus 07/09/2019   L buttock, moderate atypia   Dysplastic nevus 07/09/2019   R lat popliteal, moderate atypia   Ectopic pregnancy 01/29/2000   GERD (gastroesophageal reflux disease)    watch diet (as needed takes apple cider vinager)   History of colonic diverticulitis    hx multiple acute diverticulitis episodes treated medically   History of ectopic pregnancy    History of Helicobacter pylori infection 2013  approx.   Melanoma (HCC) 04/19/2018   R prox bicep (MMIS)   PONV (postoperative nausea and vomiting)    Seasonal affective disorder    Past Surgical History:  Procedure Laterality Date   CHOLECYSTECTOMY OPEN  1984   COLON SURGERY  April 2019   Sigmoid colon resection   COLONOSCOPY WITH PROPOFOL  N/A 04/19/2017   Procedure: COLONOSCOPY WITH PROPOFOL ;  Surgeon: Jinny Carmine,  MD;  Location: [facility];  Service: Endoscopy;  Laterality: N/A;   COLONOSCOPY WITH PROPOFOL  N/A 02/16/2023   Procedure: COLONOSCOPY WITH PROPOFOL ;  Surgeon: Jinny Carmine, MD;  Location: [facility];  Service: Endoscopy;  Laterality: N/A;   CORONARY STENT INTERVENTION N/A 10/10/2023   Procedure: CORONARY STENT INTERVENTION;  Surgeon: Darron Deatrice LABOR, MD;  Location: [facility];  Service: Cardiovascular;  Laterality: N/A;  LAD and DIAG   CYSTOSCOPY WITH STENT PLACEMENT Bilateral 08/15/2017   Procedure: CYSTOSCOPY WITH  FOLEY PLACEMENT WITH BILATERAL open ended catheter placement.  catheters removed at the end of the case.;  Surgeon: Carolee Sherwood JONETTA DOUGLAS, MD;  Location: [facility];  Service: Urology;  Laterality: Bilateral;   EXCISIONAL SENTINAL LYMPH NODE BIOPSY, NECK N/A 2016  approx.   per pt thyroid  area , benign   LAPAROSCOPIC SIGMOID COLECTOMY N/A 08/15/2017   Procedure: LAPAROSCOPIC SIGMOID COLECTOMY  ERAS PATHWAY;  Surgeon: Teresa Lonni HERO, MD;  Location: [facility];  Service: General;  Laterality: N/A;   LAPAROSCOPY FOR ECTOPIC PREGNANCY  2001   also  BILATERAL TUBAL LIGATION   LEFT HEART CATH AND CORONARY ANGIOGRAPHY N/A 10/10/2023   Procedure: LEFT HEART CATH AND CORONARY ANGIOGRAPHY;  Surgeon: Darron Deatrice LABOR, MD;  Location: [facility];  Service: Cardiovascular;  Laterality: N/A;   OSTEOTOMY Bilateral 1980 and 1981   correction congenital bow legged   POLYPECTOMY N/A 02/16/2023   Procedure: POLYPECTOMY;  Surgeon: Jinny Carmine, MD;  Location: [facility];  Service: Endoscopy;  Laterality: N/A;   TONSILLECTOMY     TUBAL LIGATION     Patient Active Problem List   Diagnosis Date Noted   CAD (coronary atherosclerotic disease) 10/10/2023   Intermittent right-sided chest pain 09/24/2023   Tubular adenoma of colon 10/05/2022   Hypertriglyceridemia without hypercholesterolemia 09/22/2020   Melanoma of skin (HCC) 10/09/2019   History of 2019 novel  coronavirus disease (COVID-19) 04/19/2019   Colon cancer screening    Polyp of sigmoid colon    Rectal polyp    Hepatic steatosis 08/12/2016   S/P partial colectomy 04/11/2016   Left thyroid  nodule 04/30/2014   Cervical cancer screening 04/05/2013   GERD (gastroesophageal reflux disease) 04/24/2012   External hemorrhoid 04/24/2012   Encounter for preventive health examination 03/29/2012   History of tobacco abuse 12/21/2010   S/P tubal ligation 01/29/2000   S/P chole 11/30/1983    ONSET DATE:  date of referral 03/24/2024  REFERRING DIAG: R49.0 (ICD-10-CM) - Dysphonia   THERAPY DIAG:  Dysphonia  Rationale for Evaluation and Treatment Rehabilitation  SUBJECTIVE:   PERTINENT HISTORY: Pt is a 63 year old female with past medical history or smoking (stopped in 2019), GERD, coronary artery disease and hyperlipidemia. Pt states that she has had a fair number of intubations.   DIAGNOSTIC FINDINGS:  ENT evaluation dated 03/20/2024 Laryngoscopy revealed Patient has bowing of bilateral vocal cords on endoscopy today. She notes she will aspirate occasionally.   PAIN:  Are you having pain? No   FALLS: Has patient fallen in last 6 months? No,   LIVING ENVIRONMENT: Lives with: lives with their spouse Lives in: House/apartment  PLOF: Independent  PATIENT GOALS    to improve voice and reduce s/s of aspiration  SUBJECTIVE STATEMENT: Pt pleasant, states that she feels more confient in her ability to communicate Pt accompanied by: self  OBJECTIVE:   TODAY'S TREATMENT:  Skilled treatment session targeted pt's dysphonia goals. SLP facilitated session by providing the following interventions:  Voice strengthening targeted thru structured question/answer (Christmas Remus) as well as semi-complex conversation. Pt with much improved voicing and projection for the duration of 45 minute session. She also states that she feels her voice is much stronger and she is more confident in her  advocating for herself when communicating with people at work.   PATIENT EDUCATION: Education details: see above Person educated: Patient Education method: Chief Technology [employer] Education comprehension: verbalized understanding and needs further education   HOME EXERCISE PROGRAM: Practice suppression techniques for throat clears Voice building drills (as above)     GOALS: Goals reviewed with patient? Yes  SHORT TERM GOALS: Target date: 10 sessions  The patient will maximize voice quality and loudness using breath support/oral resonance for sustained vowel production, pitch glides, and hierarchal speech drill.  Baseline: Goal status: INITIAL  2.  The patient will demonstrate abdominal breathing patterns and steady release of breath on exhalation to optimize efficiency of voicing and decrease laryngeal hyperfunction.  Baseline:  Goal status: INITIAL  3.  The patient will increase hydration for an eventual goal of 6-8 glasses per day and limit caffeine intake (to maximum of 1-2, 8 oz cups/day), as measured by patient report.  Baseline:  Goal status: INITIAL  4.  Pt will discriminate healthy use from misuse of the voice  in various situations with 80% accuracy across 3 data collections.  Baseline:  Goal status: INITIAL   LONG TERM GOALS: Target date: 05/27/2024  The patient will be independent for abdominal breathing and breath support exercises.  Baseline:  Goal status: INITIAL  2.  The patient will demonstrate independent understanding of vocal hygiene concepts.  Baseline:  Goal status: INITIAL  3.  Patient will report improved communication effectiveness as measured by PROM Baseline:  Goal status: INITIAL   ASSESSMENT:  CLINICAL IMPRESSION: Patient is a 63 y.o. 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 SABRA Pt presents with mild dysphonia that is c/b hoarse, breathy, mildly strained that  results in reduced ability to project her voice. This is further compounded by presence of what appears to be a habitual throat clear.   Pt eager, responsive to voice strengthening activities. See the above treatment note for details. She also mentioned an upcoming follow up appt with ENT and recheck for her middle ear.   OBJECTIVE IMPAIRMENTS include voice disorder. These impairments are limiting patient from effectively communicating at home and in community. Factors affecting potential to achieve goals and functional outcome are N/A. Patient will benefit from skilled SLP services to address above impairments and improve overall function.  REHAB POTENTIAL: Excellent  PLAN: SLP FREQUENCY: 1-2x/week  SLP DURATION: 8 weeks  PLANNED INTERVENTIONS: Aspiration precaution training, SLP instruction and feedback, Compensatory strategies, and Patient/family education   Cynthis Purington B. Rubbie, M.S., CCC-SLP, CBIS Speech-Language Pathologist Certified Brain Injury Specialist [facility]  [facility] [phone] Ascom [phone] Fax [phone]  "

## 2024-04-29 ENCOUNTER — Ambulatory Visit: Admitting: Speech Pathology

## 2024-04-29 DIAGNOSIS — R490 Dysphonia: Secondary | ICD-10-CM

## 2024-04-29 NOTE — Therapy (Signed)
 " OUTPATIENT SPEECH LANGUAGE PATHOLOGY  VOICE TREATMENT NOTE DISCHARGE SUMMARY   Patient Name: Pamela Lara MRN: [id number] DOB:1961-01-12, 63 y.o., female Today's Date: 04/29/2024  PCP: Verneita Kettering, MD REFERRING PROVIDER: Almarie Roe, PA   End of Session - 04/29/24 1049     Visit Number 8    Number of Visits 17    Date for Recertification  05/27/24    Authorization Type [employer]    Progress Note Due on Visit 10    SLP Start Time 0800    SLP Stop Time  0825    SLP Time Calculation (min) 25 min    Activity Tolerance Patient tolerated treatment well          Past Medical History:  Diagnosis Date   Arthritis    right shoulder   BPPV (benign paroxysmal positional vertigo)    CAD (coronary artery disease)    a. 09/2023 Cor CTA: Sev prox/mid LAD dzs w/ FFRct 0.64; b. 09/2023 PCI: LM 99p/m (3.5x20 Synergy DES), 31m, D1 99 (jailed-->DCBA), RI nl, LCX nl, RCA nl, EF 55-65%.   Congenital bowed legs    s/p  surgical correction   Diverticulitis, colon    recurrent   Dysplastic nevus 09/09/2018   R mid post thigh   Dysplastic nevus 07/09/2019   R buttock,moderate atypia   Dysplastic nevus 07/09/2019   L buttock, moderate atypia   Dysplastic nevus 07/09/2019   R lat popliteal, moderate atypia   Ectopic pregnancy 01/29/2000   GERD (gastroesophageal reflux disease)    watch diet (as needed takes apple cider vinager)   History of colonic diverticulitis    hx multiple acute diverticulitis episodes treated medically   History of ectopic pregnancy    History of Helicobacter pylori infection 2013  approx.   Melanoma (HCC) 04/19/2018   R prox bicep (MMIS)   PONV (postoperative nausea and vomiting)    Seasonal affective disorder    Past Surgical History:  Procedure Laterality Date   CHOLECYSTECTOMY OPEN  1984   COLON SURGERY  April 2019   Sigmoid colon resection   COLONOSCOPY WITH PROPOFOL  N/A 04/19/2017   Procedure: COLONOSCOPY WITH PROPOFOL ;   Surgeon: Jinny Carmine, MD;  Location: [facility];  Service: Endoscopy;  Laterality: N/A;   COLONOSCOPY WITH PROPOFOL  N/A 02/16/2023   Procedure: COLONOSCOPY WITH PROPOFOL ;  Surgeon: Jinny Carmine, MD;  Location: [facility];  Service: Endoscopy;  Laterality: N/A;   CORONARY STENT INTERVENTION N/A 10/10/2023   Procedure: CORONARY STENT INTERVENTION;  Surgeon: Darron Deatrice LABOR, MD;  Location: [facility];  Service: Cardiovascular;  Laterality: N/A;  LAD and DIAG   CYSTOSCOPY WITH STENT PLACEMENT Bilateral 08/15/2017   Procedure: CYSTOSCOPY WITH  FOLEY PLACEMENT WITH BILATERAL open ended catheter placement.  catheters removed at the end of the case.;  Surgeon: Carolee Sherwood JONETTA DOUGLAS, MD;  Location: [facility];  Service: Urology;  Laterality: Bilateral;   EXCISIONAL SENTINAL LYMPH NODE BIOPSY, NECK N/A 2016  approx.   per pt thyroid  area , benign   LAPAROSCOPIC SIGMOID COLECTOMY N/A 08/15/2017   Procedure: LAPAROSCOPIC SIGMOID COLECTOMY  ERAS PATHWAY;  Surgeon: Teresa Lonni HERO, MD;  Location: [facility];  Service: General;  Laterality: N/A;   LAPAROSCOPY FOR ECTOPIC PREGNANCY  2001   also  BILATERAL TUBAL LIGATION   LEFT HEART CATH AND CORONARY ANGIOGRAPHY N/A 10/10/2023   Procedure: LEFT HEART CATH AND CORONARY ANGIOGRAPHY;  Surgeon: Darron Deatrice LABOR, MD;  Location: [facility];  Service: Cardiovascular;  Laterality: N/A;   OSTEOTOMY Bilateral 1980 and 1981   correction congenital bow legged   POLYPECTOMY N/A 02/16/2023   Procedure: POLYPECTOMY;  Surgeon: Jinny Carmine, MD;  Location: [facility];  Service: Endoscopy;  Laterality: N/A;   TONSILLECTOMY     TUBAL LIGATION     Patient Active Problem List   Diagnosis Date Noted   CAD (coronary atherosclerotic disease) 10/10/2023   Intermittent right-sided chest pain 09/24/2023   Tubular adenoma of colon 10/05/2022   Hypertriglyceridemia without hypercholesterolemia 09/22/2020   Melanoma of skin (HCC) 10/09/2019    History of 2019 novel coronavirus disease (COVID-19) 04/19/2019   Colon cancer screening    Polyp of sigmoid colon    Rectal polyp    Hepatic steatosis 08/12/2016   S/P partial colectomy 04/11/2016   Left thyroid  nodule 04/30/2014   Cervical cancer screening 04/05/2013   GERD (gastroesophageal reflux disease) 04/24/2012   External hemorrhoid 04/24/2012   Encounter for preventive health examination 03/29/2012   History of tobacco abuse 12/21/2010   S/P tubal ligation 01/29/2000   S/P chole 11/30/1983    ONSET DATE:  date of referral 03/24/2024  REFERRING DIAG: R49.0 (ICD-10-CM) - Dysphonia   THERAPY DIAG:  Dysphonia  Rationale for Evaluation and Treatment Rehabilitation  SUBJECTIVE:   PERTINENT HISTORY: Pt is a 63 year old female with past medical history or smoking (stopped in 2019), GERD, coronary artery disease and hyperlipidemia. Pt states that she has had a fair number of intubations.   DIAGNOSTIC FINDINGS:  ENT evaluation dated 03/20/2024 Laryngoscopy revealed Patient has bowing of bilateral vocal cords on endoscopy today. She notes she will aspirate occasionally.   PAIN:  Are you having pain? No   FALLS: Has patient fallen in last 6 months? No,   LIVING ENVIRONMENT: Lives with: lives with their spouse Lives in: House/apartment  PLOF: Independent  PATIENT GOALS    to improve voice and reduce s/s of aspiration  SUBJECTIVE STATEMENT: Pt pleasant, states that she feels more confient in her ability to communicate Pt accompanied by: self  OBJECTIVE:   TODAY'S TREATMENT:  Skilled treatment session targeted pt's dysphonia goals. SLP facilitated session by providing the following interventions:  Pt arrives with continued improved vocal quality and intensity. She further reports that she brought a confused pt back with my voice as well as her daughter commenting on her improved voice. Pt request discharge from services d/t progress and also increase deduction  in 2026.   Discharge education provided on completing vocal warm-ups, using her pts' request for repetition as a guide to continued progress.  She also has an ENT appt tomorrow to further discuss her right ear.   PATIENT EDUCATION: Education details: see above Person educated: Patient Education method: Chief Technology Officer Education comprehension: verbalized understanding and needs further education   HOME EXERCISE PROGRAM: Practice suppression techniques for throat clears Voice building drills (as above)     GOALS: Goals reviewed with patient? Yes  SHORT TERM GOALS: Target date: 10 sessions  Updated: 04/29/2024 The patient will maximize voice quality and loudness using breath support/oral resonance for sustained vowel production, pitch glides, and hierarchal speech drill.  Baseline: Goal status: INITIAL: MET  2.  The patient will demonstrate abdominal breathing patterns and steady release of breath on exhalation to optimize efficiency of voicing and decrease laryngeal hyperfunction.  Baseline:  Goal status: INITIAL: MET  3.  The patient will increase hydration for an eventual goal of 6-8 glasses per day and limit  caffeine intake (to maximum of 1-2, 8 oz cups/day), as measured by patient report.  Baseline:  Goal status: INITIAL: MET  4.  Pt will discriminate healthy use from misuse of the voice in various situations with 80% accuracy across 3 data collections.  Baseline:  Goal status: INITIAL: MET   LONG TERM GOALS: Target date: 05/27/2024  Updated: 04/29/2024 The patient will be independent for abdominal breathing and breath support exercises.  Baseline:  Goal status: INITIAL: MET  2.  The patient will demonstrate independent understanding of vocal hygiene concepts.  Baseline:  Goal status: INITIAL: MET  3.  Patient will report improved communication effectiveness as measured by PROM Baseline:  Goal status: INITIAL: MET   ASSESSMENT:  CLINICAL  IMPRESSION: Pt has made incredible progress over the course of skilled ST sessions. As a result, her vocal quality has improved, her vocal intensity has increased and her throat clears have reduced substantially. At this time, she has met all of her goals.   Ysela Hettinger B. Rubbie, M.S., CCC-SLP, CBIS Speech-Language Pathologist Certified Brain Injury Specialist [facility]  [facility] [phone] Ascom [phone] Fax [phone]  "

## 2024-04-30 ENCOUNTER — Other Ambulatory Visit: Payer: Self-pay

## 2024-04-30 MED ORDER — TRIAMCINOLONE ACETONIDE 55 MCG/ACT NA AERO
INHALATION_SPRAY | NASAL | 11 refills | Status: AC
  Filled 2024-04-30: qty 16.9, 30d supply, fill #0

## 2024-04-30 MED ORDER — PREDNISONE 10 MG PO TABS
ORAL_TABLET | ORAL | 0 refills | Status: AC
  Filled 2024-04-30: qty 21, 6d supply, fill #0

## 2024-05-08 ENCOUNTER — Other Ambulatory Visit: Payer: Self-pay

## 2024-05-08 ENCOUNTER — Emergency Department
Admission: EM | Admit: 2024-05-08 | Discharge: 2024-05-08 | Disposition: A | Discharge status: Home / Self Care | Attending: Emergency Medicine | Admitting: Emergency Medicine

## 2024-05-08 ENCOUNTER — Emergency Department

## 2024-05-08 DIAGNOSIS — R42 Dizziness and giddiness: Secondary | ICD-10-CM | POA: Diagnosis present

## 2024-05-08 DIAGNOSIS — I2510 Atherosclerotic heart disease of native coronary artery without angina pectoris: Secondary | ICD-10-CM | POA: Diagnosis not present

## 2024-05-08 DIAGNOSIS — Z87891 Personal history of nicotine dependence: Secondary | ICD-10-CM | POA: Diagnosis not present

## 2024-05-08 LAB — CBC
HCT: 42.4 % (ref 36.0–46.0)
Hemoglobin: 15.2 g/dL — ABNORMAL HIGH (ref 12.0–15.0)
MCH: 31.6 pg (ref 26.0–34.0)
MCHC: 35.8 g/dL (ref 30.0–36.0)
MCV: 88.1 fL (ref 80.0–100.0)
Platelets: 239 K/uL (ref 150–400)
RBC: 4.81 MIL/uL (ref 3.87–5.11)
RDW: 12.4 % (ref 11.5–15.5)
WBC: 10.4 K/uL (ref 4.0–10.5)
nRBC: 0 % (ref 0.0–0.2)

## 2024-05-08 LAB — DIFFERENTIAL
Abs Immature Granulocytes: 0.09 K/uL — ABNORMAL HIGH (ref 0.00–0.07)
Basophils Absolute: 0 K/uL (ref 0.0–0.1)
Basophils Relative: 0 %
Eosinophils Absolute: 0.1 K/uL (ref 0.0–0.5)
Eosinophils Relative: 1 %
Immature Granulocytes: 1 %
Lymphocytes Relative: 33 %
Lymphs Abs: 3.4 K/uL (ref 0.7–4.0)
Monocytes Absolute: 0.8 K/uL (ref 0.1–1.0)
Monocytes Relative: 8 %
Neutro Abs: 6 K/uL (ref 1.7–7.7)
Neutrophils Relative %: 57 %

## 2024-05-08 LAB — COMPREHENSIVE METABOLIC PANEL WITH GFR
ALT: 44 U/L (ref 0–44)
AST: 23 U/L (ref 15–41)
Albumin: 4.4 g/dL (ref 3.5–5.0)
Alkaline Phosphatase: 77 U/L (ref 38–126)
Anion gap: 12 (ref 5–15)
BUN: 12 mg/dL (ref 8–23)
CO2: 22 mmol/L (ref 22–32)
Calcium: 9.2 mg/dL (ref 8.9–10.3)
Chloride: 101 mmol/L (ref 98–111)
Creatinine, Ser: 0.67 mg/dL (ref 0.44–1.00)
GFR, Estimated: >60 mL/min
Glucose, Bld: 114 mg/dL — ABNORMAL HIGH (ref 70–99)
Potassium: 3.9 mmol/L (ref 3.5–5.1)
Sodium: 135 mmol/L (ref 135–145)
Total Bilirubin: 0.6 mg/dL (ref 0.0–1.2)
Total Protein: 7.1 g/dL (ref 6.5–8.1)

## 2024-05-08 LAB — TROPONIN T, HIGH SENSITIVITY: Troponin T High Sensitivity: <15 ng/L (ref 0–19)

## 2024-05-08 LAB — PROTIME-INR
INR: 1 (ref 0.8–1.2)
Prothrombin Time: 13.2 s (ref 11.4–15.2)

## 2024-05-08 LAB — ETHANOL: Alcohol, Ethyl (B): <15 mg/dL

## 2024-05-08 LAB — APTT: aPTT: 23 s — ABNORMAL LOW (ref 24–36)

## 2024-05-08 MED ORDER — SODIUM CHLORIDE 0.9% FLUSH: 3.0000 mL | Freq: Once | INTRAVENOUS | Status: DC

## 2024-05-08 MED ORDER — MECLIZINE HCL 25 MG PO TABS
25.0000 mg | ORAL_TABLET | Freq: Three times a day (TID) | ORAL | 0 refills | Status: AC | PRN
  Filled 2024-05-08: qty 30, 10d supply, fill #0

## 2024-05-08 MED ORDER — MECLIZINE HCL 25 MG PO TABS
25.0000 mg | ORAL_TABLET | Freq: Once | ORAL | Status: AC
  Administered 2024-05-08: 25 mg via ORAL
  Filled 2024-05-08: qty 1

## 2024-05-08 NOTE — ED Notes (Signed)
 Pt at MRI

## 2024-05-08 NOTE — ED Provider Notes (Signed)
----------------------------------------- °  4:46 PM on 05/08/2024 ----------------------------------------- Patient's MRI is negative for acute intracranial abnormality.  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  She has already had it worked up has had dedicated MRIs and has been told it is benign.  Patient follow-up with her PCP.  Given the patient's otherwise reassuring workup I believe the patient safe for discharge home.  Patient states she is feeling much better.  Will prescribe meclizine  and have the patient follow-up with her doctor.   Dorothyann Drivers, MD 05/08/24 [phone]

## 2024-05-08 NOTE — ED Triage Notes (Signed)
 Pt comes with dizziness, sweaty clammy flushed and vomited where here at work. Pt states she felt fine this morning and had eaten.   Pt denies any pain, blurry vision numbness or tingling. Pt speaking in clear complete sentences.

## 2024-05-08 NOTE — ED Provider Notes (Addendum)
 "  [facility] Provider Note    Event Date/Time   First MD Initiated Contact with Patient 05/08/24 1251     (approximate)   History   Dizziness   HPI  Pamela Lara is a 64 y.o. female past medical history significant for coronary artery disease, hyperlipidemia, GERD, prior tobacco use, who presents to the emergency department with dizziness.  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  States that she turned her head to the right and then had severe dizziness, felt nauseous and broke out to a sweat.  States that she felt very dizzy like the room was spinning.  No significant headache.  States that this has happened in the past and she has had a history of benign positional vertigo but has never been this severe.  Denies any recent falls or head trauma.  Denies any chest pain or shortness of breath at this time.  States that she has been having ongoing issues with her ears and has been followed closely with ENT.  States that her symptoms have significantly improved since they started but continues to have some mild ongoing dizziness.  No headache and no sudden onset of headache.  On chart review patient had a follow-up visit with Dr. Lorelie 03/20/2024 and had a cardiac CTA that showed a calcium  score of 2 and severe proximal LAD stenosis.  Cardiac catheterization was performed that showed severe 99% stenosis to the proximal LAD, DES was placed.     Physical Exam   Triage Vital Signs: ED Triage Vitals  Encounter Vitals Group     BP 05/08/24 1002 (!) 145/79     Girls Systolic BP Percentile --      Girls Diastolic BP Percentile --      Boys Systolic BP Percentile --      Boys Diastolic BP Percentile --      Pulse Rate 05/08/24 1002 64     Resp 05/08/24 1002 18     Temp 05/08/24 1002 98 F (36.7 C)     Temp src --      SpO2 05/08/24 1002 97 %     Weight 05/08/24 1000 175 lb (79.4 kg)     Height  05/08/24 1000 5' 4.5 (1.638 m)     Head Circumference --      Peak Flow --      Pain Score 05/08/24 1000 0     Pain Loc --      Pain Education --      Exclude from Growth Chart --     Most recent vital signs: Vitals:   05/08/24 1435 05/08/24 1456  BP: 122/68 122/68  Pulse:  63  Resp:  17  Temp:  98.3 F (36.8 C)  SpO2:  98%    Physical Exam Constitutional:      Appearance: She is well-developed.  HENT:     Head: Atraumatic.     Right Ear: Tympanic membrane normal.     Left Ear: Tympanic membrane normal.  Eyes:     Extraocular Movements: Extraocular movements intact.     Conjunctiva/sclera: Conjunctivae normal.     Pupils: Pupils are equal, round, and reactive to light.     Comments: No nystagmus  Cardiovascular:     Rate and Rhythm: Regular rhythm.  Pulmonary:     Effort: No respiratory distress.  Abdominal:     General: There is no distension.  Musculoskeletal:        General: Normal range of motion.     Cervical back: Normal range of motion.  Skin:    General: Skin is warm.  Neurological:     Mental Status: She is alert. Mental status is at baseline.     GCS: GCS eye subscore is 4. GCS verbal subscore is 5. GCS motor subscore is 6.     Cranial Nerves: Cranial nerves 2-12 are intact.     Sensory: Sensation is intact.     Motor: Motor function is intact.     Coordination: Coordination is intact.     Gait: Tandem walk abnormal.     Comments: Patient with abnormal tandem gait.  Normal finger-to-nose.  Had worsening nausea but no nystagmus with Dix-Hallpike maneuver to the right.     IMPRESSION / MDM / ASSESSMENT AND PLAN / ED COURSE  I reviewed the triage vital signs and the nursing notes.  Differential diagnosis including peripheral vertigo, central vertigo, dysrhythmia, ACS, electrolyte abnormality, dehydration  Do not feel that the patient is a TNK candidate given significant improvement of her symptoms.  Van negative, no concern for LVO.  Patient has  severe contrast allergy.   EKG  I, Clotilda Punter, the attending physician, personally viewed and interpreted this ECG.  EKG showed normal sinus rhythm.  Normal intervals.  No chamber enlargement.  No significant ST elevation or depression.  No findings of acute ischemia or dysrhythmia.  No tachycardic or bradycardic dysrhythmias while on cardiac telemetry.  RADIOLOGY I independently reviewed imaging, my interpretation of imaging: CT scan of the head with no signs of intracranial hemorrhage or infarction.  Read as no acute findings.  LABS (all labs ordered are listed, but only abnormal results are displayed) Labs interpreted as -    Labs Reviewed  APTT - Abnormal; Notable for the following components:      Result Value   aPTT 23 (*)    All other components within normal limits  CBC - Abnormal; Notable for the following components:   Hemoglobin 15.2 (*)    All other components within normal limits  DIFFERENTIAL - Abnormal; Notable for the following components:   Abs Immature Granulocytes 0.09 (*)    All other components within normal limits  COMPREHENSIVE METABOLIC PANEL WITH GFR - Abnormal; Notable for the following components:   Glucose, Bld 114 (*)    All other components within normal limits  PROTIME-INR  ETHANOL  CBG MONITORING, ED  TROPONIN T, HIGH SENSITIVITY     MDM  Patient's lab work is overall reassuring with no significant leukocytosis.  Mildly elevated hemoglobin at 15.2 which appears to been present previously.  Normal electrolytes.  Troponin is negative and have low suspicion for ACS given no chest pain or chest discomfort.  Patient was treated with meclizine   On reevaluation continued to have improvement in her symptoms, with ambulation significant abnormal tandem gait.  Does have worsening symptoms whenever she looks to the right which is more concerning for peripheral vertigo however given her ongoing symptoms and gait instability we will obtain an MRI to  further evaluate for CVA.  Have low suspicion for TIA given ongoing symptoms.  Does have a history of peripheral vertigo.  After discussion with the patient if MRI is negative plan to discharge home with outpatient follow-up with meclizine  and Epley maneuvers and ENT follow-up to discuss possible vestibular physical therapy.     PROCEDURES:  Critical Care performed: No  Procedures  Patient's  presentation is most consistent with acute presentation with potential threat to life or bodily function.   MEDICATIONS ORDERED IN ED: Medications  sodium chloride  flush (NS) 0.9 % injection 3 mL (has no administration in time range)  meclizine  (ANTIVERT ) tablet 25 mg (25 mg Oral Given 05/08/24 1316)    FINAL CLINICAL IMPRESSION(S) / ED DIAGNOSES   Final diagnoses:  Dizziness  Vertigo     Rx / DC Orders   ED Discharge Orders          Ordered    meclizine  (ANTIVERT ) 25 MG tablet  3 times daily PRN        05/08/24 1458             Note:  This document was prepared using Dragon voice recognition software and may include unintentional dictation errors.   Suzanne Kirsch, MD 05/08/24 8541    Suzanne Kirsch, MD 05/08/24 1459  "

## 2024-05-16 ENCOUNTER — Encounter: Payer: Self-pay | Admitting: Pharmacist

## 2024-06-10 ENCOUNTER — Encounter: Payer: Commercial Managed Care - PPO | Admitting: Dermatology

## 2024-06-10 ENCOUNTER — Encounter: Admitting: Dermatology

## 2024-09-18 ENCOUNTER — Ambulatory Visit: Admitting: Nurse Practitioner

## 2024-11-25 ENCOUNTER — Encounter: Admitting: Internal Medicine

## 2025-03-03 ENCOUNTER — Encounter: Admitting: Dermatology
